# Patient Record
Sex: Female | Born: 1945 | Race: White | Hispanic: No | Marital: Married | Smoking: Never smoker
Health system: Southern US, Community
[De-identification: ages and names within clinical notes are randomized; demographics above are authoritative.]

## PROBLEM LIST (undated history)

## (undated) DIAGNOSIS — M2040 Other hammer toe(s) (acquired), unspecified foot: Secondary | ICD-10-CM

## (undated) DIAGNOSIS — E119 Type 2 diabetes mellitus without complications: Secondary | ICD-10-CM

## (undated) DIAGNOSIS — C541 Malignant neoplasm of endometrium: Secondary | ICD-10-CM

## (undated) DIAGNOSIS — D649 Anemia, unspecified: Secondary | ICD-10-CM

## (undated) DIAGNOSIS — E538 Deficiency of other specified B group vitamins: Secondary | ICD-10-CM

## (undated) DIAGNOSIS — I00 Rheumatic fever without heart involvement: Secondary | ICD-10-CM

## (undated) DIAGNOSIS — I10 Essential (primary) hypertension: Secondary | ICD-10-CM

## (undated) DIAGNOSIS — M21619 Bunion of unspecified foot: Secondary | ICD-10-CM

## (undated) DIAGNOSIS — R918 Other nonspecific abnormal finding of lung field: Secondary | ICD-10-CM

## (undated) DIAGNOSIS — E1140 Type 2 diabetes mellitus with diabetic neuropathy, unspecified: Secondary | ICD-10-CM

## (undated) DIAGNOSIS — F32A Depression, unspecified: Secondary | ICD-10-CM

## (undated) DIAGNOSIS — G43909 Migraine, unspecified, not intractable, without status migrainosus: Secondary | ICD-10-CM

## (undated) DIAGNOSIS — S42309A Unspecified fracture of shaft of humerus, unspecified arm, initial encounter for closed fracture: Secondary | ICD-10-CM

## (undated) DIAGNOSIS — F329 Major depressive disorder, single episode, unspecified: Secondary | ICD-10-CM

## (undated) DIAGNOSIS — C189 Malignant neoplasm of colon, unspecified: Secondary | ICD-10-CM

## (undated) DIAGNOSIS — E7800 Pure hypercholesterolemia, unspecified: Secondary | ICD-10-CM

## (undated) DIAGNOSIS — Z8701 Personal history of pneumonia (recurrent): Secondary | ICD-10-CM

## (undated) DIAGNOSIS — R0600 Dyspnea, unspecified: Secondary | ICD-10-CM

## (undated) HISTORY — PX: UPPER GI ENDOSCOPY: SHX6162

## (undated) HISTORY — DX: Type 2 diabetes mellitus without complications: E11.9

## (undated) HISTORY — DX: Anemia, unspecified: D64.9

## (undated) HISTORY — PX: COLONOSCOPY: SHX174

## (undated) HISTORY — DX: Malignant neoplasm of colon, unspecified: C18.9

## (undated) HISTORY — PX: PARTIAL COLECTOMY: SHX5273

## (undated) HISTORY — DX: Major depressive disorder, single episode, unspecified: F32.9

## (undated) HISTORY — DX: Pure hypercholesterolemia, unspecified: E78.00

## (undated) HISTORY — DX: Depression, unspecified: F32.A

## (undated) HISTORY — PX: TONSILLECTOMY: SUR1361

## (undated) HISTORY — DX: Essential (primary) hypertension: I10

## (undated) HISTORY — DX: Personal history of pneumonia (recurrent): Z87.01

---

## 1972-12-02 HISTORY — PX: TUBAL LIGATION: SHX77

## 2004-11-20 ENCOUNTER — Ambulatory Visit: Payer: Self-pay | Admitting: Oncology

## 2005-01-24 ENCOUNTER — Ambulatory Visit: Payer: Self-pay | Admitting: Oncology

## 2005-03-21 ENCOUNTER — Ambulatory Visit: Payer: Self-pay | Admitting: Oncology

## 2005-05-23 ENCOUNTER — Ambulatory Visit: Payer: Self-pay | Admitting: Oncology

## 2005-07-19 ENCOUNTER — Ambulatory Visit: Payer: Self-pay | Admitting: Oncology

## 2005-09-16 ENCOUNTER — Ambulatory Visit: Payer: Self-pay | Admitting: Oncology

## 2005-12-11 ENCOUNTER — Ambulatory Visit: Payer: Self-pay | Admitting: Oncology

## 2006-03-03 ENCOUNTER — Ambulatory Visit: Payer: Self-pay | Admitting: Oncology

## 2006-04-21 ENCOUNTER — Ambulatory Visit: Payer: Self-pay | Admitting: Oncology

## 2006-05-19 ENCOUNTER — Ambulatory Visit: Payer: Self-pay | Admitting: Oncology

## 2006-06-16 ENCOUNTER — Ambulatory Visit: Payer: Self-pay | Admitting: Oncology

## 2006-08-11 ENCOUNTER — Ambulatory Visit: Payer: Self-pay | Admitting: Oncology

## 2006-10-06 ENCOUNTER — Ambulatory Visit: Payer: Self-pay | Admitting: Oncology

## 2006-12-01 ENCOUNTER — Ambulatory Visit: Payer: Self-pay | Admitting: Oncology

## 2007-04-20 ENCOUNTER — Ambulatory Visit: Payer: Self-pay | Admitting: Oncology

## 2007-08-21 ENCOUNTER — Ambulatory Visit: Payer: Self-pay | Admitting: Oncology

## 2012-12-02 HISTORY — PX: AMPUTATION TOE: SHX6595

## 2016-01-29 DIAGNOSIS — E0842 Diabetes mellitus due to underlying condition with diabetic polyneuropathy: Secondary | ICD-10-CM | POA: Insufficient documentation

## 2016-01-29 DIAGNOSIS — L97512 Non-pressure chronic ulcer of other part of right foot with fat layer exposed: Secondary | ICD-10-CM | POA: Insufficient documentation

## 2016-02-26 DIAGNOSIS — S98131A Complete traumatic amputation of one right lesser toe, initial encounter: Secondary | ICD-10-CM | POA: Insufficient documentation

## 2017-05-21 DIAGNOSIS — E782 Mixed hyperlipidemia: Secondary | ICD-10-CM | POA: Insufficient documentation

## 2017-05-21 DIAGNOSIS — R072 Precordial pain: Secondary | ICD-10-CM | POA: Insufficient documentation

## 2017-05-21 DIAGNOSIS — H34211 Partial retinal artery occlusion, right eye: Secondary | ICD-10-CM | POA: Insufficient documentation

## 2017-05-21 DIAGNOSIS — E1142 Type 2 diabetes mellitus with diabetic polyneuropathy: Secondary | ICD-10-CM | POA: Insufficient documentation

## 2017-05-21 DIAGNOSIS — I10 Essential (primary) hypertension: Secondary | ICD-10-CM | POA: Insufficient documentation

## 2018-02-18 ENCOUNTER — Encounter: Payer: Self-pay | Admitting: Gastroenterology

## 2018-04-15 ENCOUNTER — Encounter: Payer: Self-pay | Admitting: Obstetrics

## 2018-06-10 ENCOUNTER — Telehealth: Payer: Self-pay

## 2018-06-10 DIAGNOSIS — C541 Malignant neoplasm of endometrium: Secondary | ICD-10-CM | POA: Insufficient documentation

## 2018-06-10 NOTE — Telephone Encounter (Signed)
LM for Cecille Rubin stating that pt  Has an appointment on 7-15 at 1015 with Dr. Gerarda Fraction.  She needs to register at 0930 and go over New patient information. Requested records including recent  pap smear information directly with Cecille Rubin in an earlier conversation.

## 2018-06-11 NOTE — H&P (View-Only) (Signed)
GYNECOLOGIC ONCOLOGY - [facility] at [facility]               Dr. Janie Morning, Dr. Marti Sleigh, Dr. Precious Haws, Dr. Everitt Amber [facility] Note: New Patient FIRST VISIT   Consult was requested by Dr. Ashby Dawes  Chief Complaint  Patient presents with  . Endometrial cancer [facility])    HPI: [name]  is a very nice 72 y.o.  P4  She noted postmenopausal bleeding described as spotting a few months prior to presentation. Noted some cramps, otherwise no significant symptoms. She called her PCP to report the bleeding and then was referred to Dr. Octaviano Glow for work-up. A Pap smear was done revealing AGUS with HRHPV negative. A cervical polyp was removed and found to be benign.  Followup TVUS showing thickened EM lining and bilateral adnexal masses. Endometrial biopsy  (read by [facility]) revealed grade 1 endometrial adenocarcinoma.   CA125 elevated for the lab at 30.7 (lab normal <21) CEA mildly elevated at 3.3 (lab normal <3 for nonsmokers)  Denies nausea, vomiting, change in appetite. Has lost about 7 pounds in 6 months. Some pelvic cramping.  Has history of colon cancer operated on in [facility]. States had normal colonoscopy 2018.    Imported EPIC Oncologic History:    No history exists.    Measurement of disease:  TBD .   Radiology: . 05/19/18 - TVUS -  8.7 x 5 x 4cm uterus , with 2 cm Em lining avascular. 2 complex masses in CDS 5.5x5x6cm and 6x6.3x6.4 cm vascular (ovaries versus other).  Current Meds:  Outpatient Encounter Medications as of 06/15/2018  Medication Sig  . Alpha-Lipoic Acid 200 MG CAPS Take by mouth 3 (three) times daily.  Marland Kitchen amLODipine (NORVASC) 10 MG tablet Take 10 mg by mouth daily.   . carvedilol (COREG) 6.25 MG tablet Take by mouth 2 (two) times daily.  . citalopram (CELEXA) 40 MG tablet Take 1 tablet by mouth daily.  Marland Kitchen glucose blood (ONE TOUCH ULTRA TEST) test strip   .  hydrochlorothiazide (HYDRODIURIL) 25 MG tablet Take 1 tablet by mouth daily.  Marland Kitchen lisinopril (PRINIVIL,ZESTRIL) 20 MG tablet Take 1 tablet by mouth daily.  . metFORMIN (GLUCOPHAGE) 500 MG tablet Take 1 tablet by mouth. Take three tablets in the am take two tablets in the evening.  . nitroGLYCERIN (NITROSTAT) 0.4 MG SL tablet Place under the tongue as needed for chest pain. Patient takes as needed for chest pain  . ONETOUCH DELICA LANCETS 87F MISC   . ONETOUCH VERIO test strip   . simvastatin (ZOCOR) 40 MG tablet Take 1 tablet by mouth at bedtime.   No facility-administered encounter medications on file as of 06/15/2018.     Allergy:  Allergies  Allergen Reactions  . Hydrocodone Other (See Comments)    Hallucinations    Past Surgical Hx:  Past Surgical History:  Procedure Laterality Date  . AMPUTATION TOE  2014   2 toes amputated  . PARTIAL COLECTOMY  2002 or 2003   Colon cancer [facility] - "took 18-22 inches"  . TONSILLECTOMY     Patient had her tonsils taken out at age 89  . TUBAL LIGATION  1974    Past Medical Hx:  Past Medical History:  Diagnosis Date  . Anemia   . Colon cancer ([place])    surgery followed by chemo for "6 months"  . Depression   . Diabetes mellitus without complication ([place])   .  High cholesterol   . History of pneumonia   . Hypertension     Past Gynecological History:   GYNECOLOGIC HISTORY:  No LMP recorded. Menarche: 72 years old P 4 LMP 20 years ago, then again with referral diagnosis Contraceptive never HRT 1 year  Last Pap  Referral Pap 05/2018 AGUS with negative HRHPV  Family Hx:  Family History  Problem Relation Age of Onset  . Heart attack Father   . Diabetes Brother   . Diabetes Maternal Grandmother   . Lung cancer Mother   . Breast cancer Mother        ? bony mets  . Breast cancer Maternal Aunt   . Colon cancer Maternal Uncle   . Breast cancer Maternal Aunt   . Cancer Cousin        unknown type in their 17's  . Cancer  Cousin        Lung in their 58's  . Colon cancer Maternal Uncle     Social Hx:  Tobacco use: never Alcohol use: never Illicit Drug use: denies Illicit IV Drug use: denies  Review of Systems:  Review of Systems  Genitourinary: Positive for vaginal bleeding.   All other systems reviewed and are negative.    Vitals:  Vitals:   06/15/18 1000  BP: (!) 123/56  Pulse: 95  Resp: 18  Temp: 98.6 F (37 C)  SpO2: 96%   Body mass index is 35.35 kg/m.   Physical Exam: ECOG PERFORMANCE STATUS: 1 - Symptomatic but completely ambulatory   General :  Well developed, 72 y.o., female in no apparent distress HEENT:  Normocephalic/atraumatic, symmetric, EOMI, eyelids normal Neck:   Supple, no masses.  Lymphatics:  No cervical/ submandibular/ supraclavicular/ infraclavicular/ inguinal adenopathy Respiratory:  Respirations unlabored, no use of accessory muscles CV:   Deferred Breast:  Deferred Musculoskeletal: No CVA tenderness, normal muscle strength. Abdomen:  Soft, non-tender and nondistended. No evidence of hernia. No masses. Extremities:  No lymphedema, no erythema, non-tender. Skin:   Normal inspection Neuro/Psych:  No focal motor deficit, no abnormal mental status. Normal gait. Normal affect. Alert and oriented to person, place, and time  Genito Urinary: Vulva: Normal external female genitalia.  Bladder/urethra: Urethral meatus normal in size and location. No lesions or   masses, well supported bladder Speculum exam: Vagina: No lesion, no discharge, no bleeding. Cervix: Normal appearing, no lesions. Bimanual exam:  Uterus: Normal size, mobile.  Adnexa: + palpable fullness / mass in culdesac  Rectovaginal:  Good tone, confirms above. No cul de sac nodularity, no parametrial involvement or nodularity.   Assessment Endometrial cancer ? Adnexal (bilateral) versus other  Plan: 1. Counseling for uterine cancer o We discussed the diagnosis of uterine cancer the typical  presentation and prognosis o Standard of care hysterectomy versus alternatives of hormones and radiation were reviewed o We discussed the possibility of additional treatments following surgery, such as radiation and/or chemotherapy 2. Surgical management o Patient would like to proceed with standard of care management i.e. Hysterectomy o Reviewed the options for modalities of hysterectomy o Surgical sketch was reviewed including the risks, benefits, and alternatives.  She was given a copy of this today and one will be placed in the chart. o Plan will be for robotic assisted laparoscopic hysterectomy, BSO, sentinel lymph node biopsy, washings, and possible full staging i. She understands if I diagnose ovarian cancer my recommendation would be for Exlap/staging. ii. Also, given the colon resection, understands the risk of adhesive disease and conversion to  Exlap 3. Preoperative items will include  o Obtaining CT imaging given the ? Adnexal masses and slightly elevated CA125 o HgbA1c 4. The husband and son were present for the discussion with no further questions after review of the above 5. I will plan to have her return in 10 to 14 days postop to review the pathology and discuss any further management 6. Genetics to be kept in planning given personal and family h/o colon cancer  [name], MD  06/15/2018, 4:54 PM    Cc: Ashby Dawes, DO (Referring Ob/Gyn) Cecille Amsterdam, DO (PCP)

## 2018-06-11 NOTE — Progress Notes (Signed)
GYNECOLOGIC ONCOLOGY - [facility]               Dr. Janie Morning, Dr. Marti Sleigh, Dr. Precious Haws, Dr. Everitt Amber [facility] [place] Note: New Patient FIRST VISIT   Consult was requested by Dr. Ashby Dawes  Chief Complaint  Patient presents with  . Endometrial cancer [facility])    HPI: [name]  is a very nice 72 y.o.  P4  She noted postmenopausal bleeding described as spotting a few months prior to presentation. Noted some cramps, otherwise no significant symptoms. She called her PCP to report the bleeding and then was referred to Dr. Octaviano Glow for work-up. A Pap smear was done revealing AGUS with HRHPV negative. A cervical polyp was removed and found to be benign.  Followup TVUS showing thickened EM lining and bilateral adnexal masses. Endometrial biopsy  (read by [facility]) revealed grade 1 endometrial adenocarcinoma.   CA125 elevated for the lab at 30.7 (lab normal <21) CEA mildly elevated at 3.3 (lab normal <3 for nonsmokers)  Denies nausea, vomiting, change in appetite. Has lost about 7 pounds in 6 months. Some pelvic cramping.  Has history of colon cancer operated on in [facility]. States had normal colonoscopy 2018.    Imported EPIC Oncologic History:    No history exists.    Measurement of disease:  TBD .   Radiology: . 05/19/18 - TVUS -  8.7 x 5 x 4cm uterus , with 2 cm Em lining avascular. 2 complex masses in CDS 5.5x5x6cm and 6x6.3x6.4 cm vascular (ovaries versus other).  Current Meds:  Outpatient Encounter Medications as of 06/15/2018  Medication Sig  . Alpha-Lipoic Acid 200 MG CAPS Take by mouth 3 (three) times daily.  Marland Kitchen amLODipine (NORVASC) 10 MG tablet Take 10 mg by mouth daily.   . carvedilol (COREG) 6.25 MG tablet Take by mouth 2 (two) times daily.  . citalopram (CELEXA) 40 MG tablet Take 1 tablet by mouth daily.  Marland Kitchen glucose blood (ONE TOUCH ULTRA TEST) test strip   .  hydrochlorothiazide (HYDRODIURIL) 25 MG tablet Take 1 tablet by mouth daily.  Marland Kitchen lisinopril (PRINIVIL,ZESTRIL) 20 MG tablet Take 1 tablet by mouth daily.  . metFORMIN (GLUCOPHAGE) 500 MG tablet Take 1 tablet by mouth. Take three tablets in the am take two tablets in the evening.  . nitroGLYCERIN (NITROSTAT) 0.4 MG SL tablet Place under the tongue as needed for chest pain. Patient takes as needed for chest pain  . ONETOUCH DELICA LANCETS 51V MISC   . ONETOUCH VERIO test strip   . simvastatin (ZOCOR) 40 MG tablet Take 1 tablet by mouth at bedtime.   No facility-administered encounter medications on file as of 06/15/2018.     Allergy:  Allergies  Allergen Reactions  . Hydrocodone Other (See Comments)    Hallucinations    Past Surgical Hx:  Past Surgical History:  Procedure Laterality Date  . AMPUTATION TOE  2014   2 toes amputated  . PARTIAL COLECTOMY  2002 or 2003   Colon cancer [facility] - "took 18-22 inches"  . TONSILLECTOMY     Patient had her tonsils taken out at age 42  . TUBAL LIGATION  1974    Past Medical Hx:  Past Medical History:  Diagnosis Date  . Anemia   . Colon cancer ([place])    surgery followed by chemo for "6 months"  . Depression   . Diabetes mellitus without complication ([place])   .  High cholesterol   . History of pneumonia   . Hypertension     Past Gynecological History:   GYNECOLOGIC HISTORY:  No LMP recorded. Menarche: 72 years old P 4 LMP 20 years ago, then again with referral diagnosis Contraceptive never HRT 1 year  Last Pap  Referral Pap 05/2018 AGUS with negative HRHPV  Family Hx:  Family History  Problem Relation Age of Onset  . Heart attack Father   . Diabetes Brother   . Diabetes Maternal Grandmother   . Lung cancer Mother   . Breast cancer Mother        ? bony mets  . Breast cancer Maternal Aunt   . Colon cancer Maternal Uncle   . Breast cancer Maternal Aunt   . Cancer Cousin        unknown type in their 79's  . Cancer  Cousin        Lung in their 3's  . Colon cancer Maternal Uncle     Social Hx:  Tobacco use: never Alcohol use: never Illicit Drug use: denies Illicit IV Drug use: denies  Review of Systems:  Review of Systems  Genitourinary: Positive for vaginal bleeding.   All other systems reviewed and are negative.    Vitals:  Vitals:   06/15/18 1000  BP: (!) 123/56  Pulse: 95  Resp: 18  Temp: 98.6 F (37 C)  SpO2: 96%   Body mass index is 35.35 kg/m.   Physical Exam: ECOG PERFORMANCE STATUS: 1 - Symptomatic but completely ambulatory   General :  Well developed, 72 y.o., female in no apparent distress HEENT:  Normocephalic/atraumatic, symmetric, EOMI, eyelids normal Neck:   Supple, no masses.  Lymphatics:  No cervical/ submandibular/ supraclavicular/ infraclavicular/ inguinal adenopathy Respiratory:  Respirations unlabored, no use of accessory muscles CV:   Deferred Breast:  Deferred Musculoskeletal: No CVA tenderness, normal muscle strength. Abdomen:  Soft, non-tender and nondistended. No evidence of hernia. No masses. Extremities:  No lymphedema, no erythema, non-tender. Skin:   Normal inspection Neuro/Psych:  No focal motor deficit, no abnormal mental status. Normal gait. Normal affect. Alert and oriented to person, place, and time  Genito Urinary: Vulva: Normal external female genitalia.  Bladder/urethra: Urethral meatus normal in size and location. No lesions or   masses, well supported bladder Speculum exam: Vagina: No lesion, no discharge, no bleeding. Cervix: Normal appearing, no lesions. Bimanual exam:  Uterus: Normal size, mobile.  Adnexa: + palpable fullness / mass in culdesac  Rectovaginal:  Good tone, confirms above. No cul de sac nodularity, no parametrial involvement or nodularity.   Assessment Endometrial cancer ? Adnexal (bilateral) versus other  Plan: 1. Counseling for uterine cancer o We discussed the diagnosis of uterine cancer the typical  presentation and prognosis o Standard of care hysterectomy versus alternatives of hormones and radiation were reviewed o We discussed the possibility of additional treatments following surgery, such as radiation and/or chemotherapy 2. Surgical management o Patient would like to proceed with standard of care management i.e. Hysterectomy o Reviewed the options for modalities of hysterectomy o Surgical sketch was reviewed including the risks, benefits, and alternatives.  She was given a copy of this today and one will be placed in the chart. o Plan will be for robotic assisted laparoscopic hysterectomy, BSO, sentinel lymph node biopsy, washings, and possible full staging i. She understands if I diagnose ovarian cancer my recommendation would be for Exlap/staging. ii. Also, given the colon resection, understands the risk of adhesive disease and conversion to  Exlap 3. Preoperative items will include  o Obtaining CT imaging given the ? Adnexal masses and slightly elevated CA125 o HgbA1c 4. The husband and son were present for the discussion with no further questions after review of the above 5. I will plan to have her return in 10 to 14 days postop to review the pathology and discuss any further management 6. Genetics to be kept in planning given personal and family h/o colon cancer  [name], MD  06/15/2018, 4:54 PM    Cc: Ashby Dawes, DO (Referring Ob/Gyn) Cecille Amsterdam, DO (PCP)

## 2018-06-15 ENCOUNTER — Inpatient Hospital Stay: Payer: Medicare HMO

## 2018-06-15 ENCOUNTER — Inpatient Hospital Stay: Payer: Medicare HMO | Attending: Obstetrics | Admitting: Obstetrics

## 2018-06-15 ENCOUNTER — Encounter: Payer: Self-pay | Admitting: Obstetrics

## 2018-06-15 VITALS — BP 123/56 | HR 95 | Temp 98.6°F | Resp 18 | Ht 65.5 in | Wt 215.7 lb

## 2018-06-15 DIAGNOSIS — E119 Type 2 diabetes mellitus without complications: Secondary | ICD-10-CM | POA: Diagnosis not present

## 2018-06-15 DIAGNOSIS — D4959 Neoplasm of unspecified behavior of other genitourinary organ: Secondary | ICD-10-CM

## 2018-06-15 DIAGNOSIS — C541 Malignant neoplasm of endometrium: Secondary | ICD-10-CM | POA: Insufficient documentation

## 2018-06-15 DIAGNOSIS — R971 Elevated cancer antigen 125 [CA 125]: Secondary | ICD-10-CM

## 2018-06-15 DIAGNOSIS — Z85038 Personal history of other malignant neoplasm of large intestine: Secondary | ICD-10-CM | POA: Insufficient documentation

## 2018-06-15 DIAGNOSIS — N949 Unspecified condition associated with female genital organs and menstrual cycle: Secondary | ICD-10-CM

## 2018-06-15 LAB — HEMOGLOBIN A1C
Hgb A1c MFr Bld: 7 % — ABNORMAL HIGH (ref 4.8–5.6)
MEAN PLASMA GLUCOSE: 154.2 mg/dL

## 2018-06-15 LAB — BUN & CREATININE (CHCC)
BUN: 29 mg/dL — ABNORMAL HIGH (ref 8–23)
CREATININE: 1.24 mg/dL — AB (ref 0.44–1.00)
GFR, Est AFR Am: 49 mL/min — ABNORMAL LOW (ref >60)
GFR, Estimated: 42 mL/min — ABNORMAL LOW (ref >60)

## 2018-06-15 NOTE — Patient Instructions (Addendum)
Plan on having a Bmet and HbgA1C drawn today.  Your CT scan of the chest, abdomen, and pelvis will be this Friday, June 19, 2018 at [facility] at 3:30pm.  Arrive at 3pm to check in.  See the instructions sheet about drinking the two bottles of contrast (one at 1:30pm and the other at 2:30pm the day of your scan).  Nothing to eat or drink 4 hours before your scan as well.                 Preparing for your Surgery  Plan for surgery on July 01, 2018 with Dr. Precious Haws at [place] will be scheduled for a robotic assisted total laparoscopic hysterectomy, bilateral salpingo-oophorectomy, sentinel lymph node biopsy, possible laparotomy, possible staging.  Pre-operative Testing -You will receive a phone call from presurgical testing at [facility] to arrange for a pre-operative testing appointment before your surgery.  This appointment normally occurs one to two weeks before your scheduled surgery.   -Bring your insurance card, copy of an advanced directive if applicable, medication list  -At that visit, you will be asked to sign a consent for a possible blood transfusion in case a transfusion becomes necessary during surgery.  The need for a blood transfusion is rare but having consent is a necessary part of your care.     -You should not be taking blood thinners or aspirin at least ten days prior to surgery unless instructed by your surgeon.  Day Before Surgery at [place] will be asked to take in a light diet the day before surgery.  Avoid carbonated beverages.  You will be advised to have nothing to eat or drink after midnight the evening before.    Eat a light diet the day before surgery.  Examples including soups, broths, toast, yogurt, mashed potatoes.  Things to avoid include carbonated beverages (fizzy beverages), raw fruits and raw vegetables, or beans.   If your bowels are filled with gas, your surgeon will have difficulty visualizing your pelvic  organs which increases your surgical risks.  Your role in recovery Your role is to become active as soon as directed by your doctor, while still giving yourself time to heal.  Rest when you feel tired. You will be asked to do the following in order to speed your recovery:  - Cough and breathe deeply. This helps toclear and expand your lungs and can prevent pneumonia. You may be given a spirometer to practice deep breathing. A staff member will show you how to use the spirometer. - Do mild physical activity. Walking or moving your legs help your circulation and body functions return to normal. A staff member will help you when you try to walk and will provide you with simple exercises. Do not try to get up or walk alone the first time. - Actively manage your pain. Managing your pain lets you move in comfort. We will ask you to rate your pain on a scale of zero to 10. It is your responsibility to tell your doctor or nurse where and how much you hurt so your pain can be treated.  Special Considerations -If you are diabetic, you may be placed on insulin after surgery to have closer control over your blood sugars to promote healing and recovery.  This does not mean that you will be discharged on insulin.  If applicable, your oral antidiabetics will be resumed when you are tolerating a solid diet.  -Your final  pathology results from surgery should be available by the Friday after surgery and the results will be relayed to you when available.  -Dr. Everitt Amber is the Surgeon that assists your GYN Oncologist with surgery.  The next day after your surgery you will either see your GYN Oncologist, Dr. Everitt Amber, or Dr. Lahoma Crocker.   Blood Transfusion Information WHAT IS A BLOOD TRANSFUSION? A transfusion is the replacement of blood or some of its parts. Blood is made up of multiple cells which provide different functions.  Red blood cells carry oxygen and are used for blood loss  replacement.  White blood cells fight against infection.  Platelets control bleeding.  Plasma helps clot blood.  Other blood products are available for specialized needs, such as hemophilia or other clotting disorders. BEFORE THE TRANSFUSION  Who gives blood for transfusions?   You may be able to donate blood to be used at a later date on yourself (autologous donation).  Relatives can be asked to donate blood. This is generally not any safer than if you have received blood from a stranger. The same precautions are taken to ensure safety when a relative's blood is donated.  Healthy volunteers who are fully evaluated to make sure their blood is safe. This is blood bank blood. Transfusion therapy is the safest it has ever been in the practice of medicine. Before blood is taken from a donor, a complete history is taken to make sure that person has no history of diseases nor engages in risky social behavior (examples are intravenous drug use or sexual activity with multiple partners). The donor's travel history is screened to minimize risk of transmitting infections, such as malaria. The donated blood is tested for signs of infectious diseases, such as HIV and hepatitis. The blood is then tested to be sure it is compatible with you in order to minimize the chance of a transfusion reaction. If you or a relative donates blood, this is often done in anticipation of surgery and is not appropriate for emergency situations. It takes many days to process the donated blood. RISKS AND COMPLICATIONS Although transfusion therapy is very safe and saves many lives, the main dangers of transfusion include:   Getting an infectious disease.  Developing a transfusion reaction. This is an allergic reaction to something in the blood you were given. Every precaution is taken to prevent this. The decision to have a blood transfusion has been considered carefully by your caregiver before blood is given. Blood is not  given unless the benefits outweigh the risks.

## 2018-06-16 ENCOUNTER — Telehealth: Payer: Self-pay | Admitting: *Deleted

## 2018-06-16 NOTE — Telephone Encounter (Signed)
Faxed ROI to [employer]; release [phone]

## 2018-06-18 ENCOUNTER — Telehealth: Payer: Self-pay

## 2018-06-18 NOTE — Telephone Encounter (Signed)
Faxed recent Hgb A1c to PCP. Received lab work from PCP from 04-15-18 which included Hgb A1c, c-met, and lipid panel.

## 2018-06-19 ENCOUNTER — Ambulatory Visit (HOSPITAL_COMMUNITY)
Admission: RE | Admit: 2018-06-19 | Discharge: 2018-06-19 | Disposition: A | Discharge status: Home / Self Care | Payer: Medicare HMO | Source: Ambulatory Visit | Attending: Obstetrics | Admitting: Obstetrics

## 2018-06-19 ENCOUNTER — Encounter (HOSPITAL_COMMUNITY): Payer: Self-pay | Admitting: Radiology

## 2018-06-19 DIAGNOSIS — N6320 Unspecified lump in the left breast, unspecified quadrant: Secondary | ICD-10-CM | POA: Insufficient documentation

## 2018-06-19 DIAGNOSIS — N949 Unspecified condition associated with female genital organs and menstrual cycle: Secondary | ICD-10-CM

## 2018-06-19 DIAGNOSIS — Z85038 Personal history of other malignant neoplasm of large intestine: Secondary | ICD-10-CM | POA: Diagnosis not present

## 2018-06-19 DIAGNOSIS — D4959 Neoplasm of unspecified behavior of other genitourinary organ: Secondary | ICD-10-CM

## 2018-06-19 DIAGNOSIS — R918 Other nonspecific abnormal finding of lung field: Secondary | ICD-10-CM | POA: Insufficient documentation

## 2018-06-19 DIAGNOSIS — C541 Malignant neoplasm of endometrium: Secondary | ICD-10-CM | POA: Diagnosis present

## 2018-06-19 DIAGNOSIS — K8020 Calculus of gallbladder without cholecystitis without obstruction: Secondary | ICD-10-CM | POA: Insufficient documentation

## 2018-06-19 DIAGNOSIS — R9389 Abnormal findings on diagnostic imaging of other specified body structures: Secondary | ICD-10-CM | POA: Diagnosis not present

## 2018-06-19 DIAGNOSIS — Z9049 Acquired absence of other specified parts of digestive tract: Secondary | ICD-10-CM | POA: Diagnosis not present

## 2018-06-19 MED ORDER — IOPAMIDOL (ISOVUE-300) INJECTION 61%
INTRAVENOUS | Status: AC
  Filled 2018-06-19: qty 100

## 2018-06-19 MED ORDER — IOPAMIDOL (ISOVUE-300) INJECTION 61%
100.0000 mL | Freq: Once | INTRAVENOUS | Status: AC | PRN
  Administered 2018-06-19: 80 mL via INTRAVENOUS

## 2018-06-23 ENCOUNTER — Encounter (HOSPITAL_COMMUNITY): Payer: Self-pay

## 2018-06-23 ENCOUNTER — Telehealth: Payer: Self-pay | Admitting: Oncology

## 2018-06-23 NOTE — Patient Instructions (Signed)
Your procedure is scheduled on: Wednesday, July 01, 2018   Surgery Time:  7:30AM-11:00AM   Report to [facility] Main  Entrance    Report to admitting at 5:30 AM   Call this number if you have problems the morning of surgery [phone]   Eat a light diet the day before surgery. Examples including soups, broths, toast, yogurt, mashed potatoes. Things to avoid include carbonated beverages (fizzy beverages), raw fruits and raw vegetables, or beans.    Do not eat food or drink liquids :After Midnight.   Do NOT smoke after Midnight   Take these medicines the morning of surgery with A SIP OF WATER: Allopurinol, Amlodipine, Carvedilol                               You may not have any metal on your body including hair pins, jewelry, and body piercings             Do not wear make-up, lotions, powders, perfumes/cologne, or deodorant             Do not wear nail polish.  Do not shave  48 hours prior to surgery.                Do not bring valuables to the hospital. [place].   Contacts, dentures or bridgework may not be worn into surgery.   Leave suitcase in the car. After surgery it may be brought to your room.   Special Instructions: Bring a copy of your healthcare power of attorney and living will documents         the day of surgery if you haven't scanned them in before.              Please read over the following fact sheets you were given:  [facility] - Preparing for Surgery Before surgery, you can play an important role.  Because skin is not sterile, your skin needs to be as free of germs as possible.  You can reduce the number of germs on your skin by washing with CHG (chlorahexidine gluconate) soap before surgery.  CHG is an antiseptic cleaner which kills germs and bonds with the skin to continue killing germs even after washing. Please DO NOT use if you have an allergy to CHG or antibacterial soaps.  If your  skin becomes reddened/irritated stop using the CHG and inform your nurse when you arrive at Short Stay. Do not shave (including legs and underarms) for at least 48 hours prior to the first CHG shower.  You may shave your face/neck.  Please follow these instructions carefully:  1.  Shower with CHG Soap the night before surgery and the  morning of surgery.  2.  If you choose to wash your hair, wash your hair first as usual with your normal  shampoo.  3.  After you shampoo, rinse your hair and body thoroughly to remove the shampoo.                             4.  Use CHG as you would any other liquid soap.  You can apply chg directly to the skin and wash.  Gently with a scrungie or clean washcloth.  5.  Apply the CHG Soap  to your body ONLY FROM THE NECK DOWN.   Do   not use on face/ open                           Wound or open sores. Avoid contact with eyes, ears mouth and   genitals (private parts).                       Wash face,  Genitals (private parts) with your normal soap.             6.  Wash thoroughly, paying special attention to the area where your    surgery  will be performed.  7.  Thoroughly rinse your body with warm water from the neck down.  8.  DO NOT shower/wash with your normal soap after using and rinsing off the CHG Soap.                9.  Pat yourself dry with a clean towel.            10.  Wear clean pajamas.            11.  Place clean sheets on your bed the night of your first shower and do not  sleep with pets. Day of Surgery : Do not apply any lotions/deodorants the morning of surgery.  Please wear clean clothes to the hospital/surgery center.  FAILURE TO FOLLOW THESE INSTRUCTIONS MAY RESULT IN THE CANCELLATION OF YOUR SURGERY  PATIENT SIGNATURE_________________________________  NURSE SIGNATURE__________________________________  ________________________________________________________________________   Adam Phenix  An incentive spirometer is a tool  that can help keep your lungs clear and active. This tool measures how well you are filling your lungs with each breath. Taking long deep breaths may help reverse or decrease the chance of developing breathing (pulmonary) problems (especially infection) following:  A long period of time when you are unable to move or be active. BEFORE THE PROCEDURE   If the spirometer includes an indicator to show your best effort, your nurse or respiratory therapist will set it to a desired goal.  If possible, sit up straight or lean slightly forward. Try not to slouch.  Hold the incentive spirometer in an upright position. INSTRUCTIONS FOR USE  1. Sit on the edge of your bed if possible, or sit up as far as you can in bed or on a chair. 2. Hold the incentive spirometer in an upright position. 3. Breathe out normally. 4. Place the mouthpiece in your mouth and seal your lips tightly around it. 5. Breathe in slowly and as deeply as possible, raising the piston or the ball toward the top of the column. 6. Hold your breath for 3-5 seconds or for as long as possible. Allow the piston or ball to fall to the bottom of the column. 7. Remove the mouthpiece from your mouth and breathe out normally. 8. Rest for a few seconds and repeat Steps 1 through 7 at least 10 times every 1-2 hours when you are awake. Take your time and take a few normal breaths between deep breaths. 9. The spirometer may include an indicator to show your best effort. Use the indicator as a goal to work toward during each repetition. 10. After each set of 10 deep breaths, practice coughing to be sure your lungs are clear. If you have an incision (the cut made at the time of surgery), support your incision when coughing by placing  a pillow or rolled up towels firmly against it. Once you are able to get out of bed, walk around indoors and cough well. You may stop using the incentive spirometer when instructed by your caregiver.  RISKS AND  COMPLICATIONS  Take your time so you do not get dizzy or light-headed.  If you are in pain, you may need to take or ask for pain medication before doing incentive spirometry. It is harder to take a deep breath if you are having pain. AFTER USE  Rest and breathe slowly and easily.  It can be helpful to keep track of a log of your progress. Your caregiver can provide you with a simple table to help with this. If you are using the spirometer at home, follow these instructions: [place] IF:   You are having difficultly using the spirometer.  You have trouble using the spirometer as often as instructed.  Your pain medication is not giving enough relief while using the spirometer.  You develop fever of 100.5 F (38.1 C) or higher. SEEK IMMEDIATE MEDICAL CARE IF:   You cough up bloody sputum that had not been present before.  You develop fever of 102 F (38.9 C) or greater.  You develop worsening pain at or near the incision site. MAKE SURE YOU:   Understand these instructions.  Will watch your condition.  Will get help right away if you are not doing well or get worse. Document Released: 03/31/2007 Document Revised: 02/10/2012 Document Reviewed: 06/01/2007 ExitCare Patient Information 2014 ExitCare, [place].   ________________________________________________________________________  WHAT IS A BLOOD TRANSFUSION? Blood Transfusion Information  A transfusion is the replacement of blood or some of its parts. Blood is made up of multiple cells which provide different functions.  Red blood cells carry oxygen and are used for blood loss replacement.  White blood cells fight against infection.  Platelets control bleeding.  Plasma helps clot blood.  Other blood products are available for specialized needs, such as hemophilia or other clotting disorders. BEFORE THE TRANSFUSION  Who gives blood for transfusions?   Healthy volunteers who are fully evaluated to make sure  their blood is safe. This is blood bank blood. Transfusion therapy is the safest it has ever been in the practice of medicine. Before blood is taken from a donor, a complete history is taken to make sure that person has no history of diseases nor engages in risky social behavior (examples are intravenous drug use or sexual activity with multiple partners). The donor's travel history is screened to minimize risk of transmitting infections, such as malaria. The donated blood is tested for signs of infectious diseases, such as HIV and hepatitis. The blood is then tested to be sure it is compatible with you in order to minimize the chance of a transfusion reaction. If you or a relative donates blood, this is often done in anticipation of surgery and is not appropriate for emergency situations. It takes many days to process the donated blood. RISKS AND COMPLICATIONS Although transfusion therapy is very safe and saves many lives, the main dangers of transfusion include:   Getting an infectious disease.  Developing a transfusion reaction. This is an allergic reaction to something in the blood you were given. Every precaution is taken to prevent this. The decision to have a blood transfusion has been considered carefully by your caregiver before blood is given. Blood is not given unless the benefits outweigh the risks. AFTER THE TRANSFUSION  Right after receiving a blood transfusion,  you will usually feel much better and more energetic. This is especially true if your red blood cells have gotten low (anemic). The transfusion raises the level of the red blood cells which carry oxygen, and this usually causes an energy increase.  The nurse administering the transfusion will monitor you carefully for complications. HOME CARE INSTRUCTIONS  No special instructions are needed after a transfusion. You may find your energy is better. Speak with your caregiver about any limitations on activity for underlying diseases  you may have. SEEK MEDICAL CARE IF:   Your condition is not improving after your transfusion.  You develop redness or irritation at the intravenous (IV) site. SEEK IMMEDIATE MEDICAL CARE IF:  Any of the following symptoms occur over the next 12 hours:  Shaking chills.  You have a temperature by mouth above 102 F (38.9 C), not controlled by medicine.  Chest, back, or muscle pain.  People around you feel you are not acting correctly or are confused.  Shortness of breath or difficulty breathing.  Dizziness and fainting.  You get a rash or develop hives.  You have a decrease in urine output.  Your urine turns a dark color or changes to pink, red, or brown. Any of the following symptoms occur over the next 10 days:  You have a temperature by mouth above 102 F (38.9 C), not controlled by medicine.  Shortness of breath.  Weakness after normal activity.  The white part of the eye turns yellow (jaundice).  You have a decrease in the amount of urine or are urinating less often.  Your urine turns a dark color or changes to pink, red, or brown. Document Released: 11/15/2000 Document Revised: 02/10/2012 Document Reviewed: 07/04/2008 [facility] Patient Information 2014 [place], [place].  _______________________________________________________________________

## 2018-06-23 NOTE — Pre-Procedure Instructions (Signed)
Hgb A1C (7.0) 06/15/18 in epic.

## 2018-06-23 NOTE — Telephone Encounter (Addendum)
Called patient regarding her last mammogram.  She said she thinks it was last year at [place] [facility] and they said the last mammogram patient had was 10/16/16.  They will push the images so that they can be viewed in [place].

## 2018-06-23 NOTE — Telephone Encounter (Signed)
Requested copy of the mammogram report from [facility] with [facility] records.

## 2018-06-24 ENCOUNTER — Telehealth: Payer: Self-pay | Admitting: Gynecologic Oncology

## 2018-06-24 ENCOUNTER — Encounter: Payer: Self-pay | Admitting: Oncology

## 2018-06-24 ENCOUNTER — Telehealth: Payer: Self-pay | Admitting: Oncology

## 2018-06-24 DIAGNOSIS — N6320 Unspecified lump in the left breast, unspecified quadrant: Secondary | ICD-10-CM

## 2018-06-24 NOTE — Telephone Encounter (Signed)
Called patient and notified her of mammogram/left ultrasound appointment at [facility] on 07/08/18 at 9:30 am.  Patient verbalized understanding and agreement.

## 2018-06-24 NOTE — Telephone Encounter (Signed)
Discussed CT scan findings with the patient along with the need to have an up to date mammogram since an enhancing area was noted on CT chest.  Patient would like to have at [facility].  Advised she would be contacted with additional information.  Pre-op is tomorrow.  Advised to call for any needs or concerns.

## 2018-06-25 ENCOUNTER — Encounter (HOSPITAL_COMMUNITY): Payer: Self-pay

## 2018-06-25 ENCOUNTER — Other Ambulatory Visit: Payer: Self-pay

## 2018-06-25 ENCOUNTER — Encounter (HOSPITAL_COMMUNITY)
Admission: RE | Admit: 2018-06-25 | Discharge: 2018-06-25 | Disposition: A | Discharge status: Home / Self Care | Payer: Medicare HMO | Source: Ambulatory Visit | Attending: Obstetrics | Admitting: Obstetrics

## 2018-06-25 DIAGNOSIS — E1140 Type 2 diabetes mellitus with diabetic neuropathy, unspecified: Secondary | ICD-10-CM | POA: Diagnosis not present

## 2018-06-25 DIAGNOSIS — Z01812 Encounter for preprocedural laboratory examination: Secondary | ICD-10-CM | POA: Insufficient documentation

## 2018-06-25 DIAGNOSIS — Z01810 Encounter for preprocedural cardiovascular examination: Secondary | ICD-10-CM | POA: Insufficient documentation

## 2018-06-25 DIAGNOSIS — I452 Bifascicular block: Secondary | ICD-10-CM | POA: Diagnosis not present

## 2018-06-25 HISTORY — DX: Migraine, unspecified, not intractable, without status migrainosus: G43.909

## 2018-06-25 HISTORY — DX: Type 2 diabetes mellitus with diabetic neuropathy, unspecified: E11.40

## 2018-06-25 HISTORY — DX: Dyspnea, unspecified: R06.00

## 2018-06-25 HISTORY — DX: Rheumatic fever without heart involvement: I00

## 2018-06-25 HISTORY — DX: Bunion of unspecified foot: M21.619

## 2018-06-25 HISTORY — DX: Other nonspecific abnormal finding of lung field: R91.8

## 2018-06-25 HISTORY — DX: Deficiency of other specified B group vitamins: E53.8

## 2018-06-25 HISTORY — DX: Other hammer toe(s) (acquired), unspecified foot: M20.40

## 2018-06-25 HISTORY — DX: Malignant neoplasm of endometrium: C54.1

## 2018-06-25 HISTORY — DX: Unspecified fracture of shaft of humerus, unspecified arm, initial encounter for closed fracture: S42.309A

## 2018-06-25 LAB — COMPREHENSIVE METABOLIC PANEL
ALK PHOS: 64 U/L (ref 38–126)
ALT: 14 U/L (ref 0–44)
ANION GAP: 11 (ref 5–15)
AST: 17 U/L (ref 15–41)
Albumin: 3.6 g/dL (ref 3.5–5.0)
BUN: 22 mg/dL (ref 8–23)
CO2: 26 mmol/L (ref 22–32)
Calcium: 9.6 mg/dL (ref 8.9–10.3)
Chloride: 104 mmol/L (ref 98–111)
Creatinine, Ser: 1.26 mg/dL — ABNORMAL HIGH (ref 0.44–1.00)
GFR, EST AFRICAN AMERICAN: 48 mL/min — AB (ref >60)
GFR, EST NON AFRICAN AMERICAN: 42 mL/min — AB (ref >60)
Glucose, Bld: 111 mg/dL — ABNORMAL HIGH (ref 70–99)
Potassium: 5.1 mmol/L (ref 3.5–5.1)
SODIUM: 141 mmol/L (ref 135–145)
Total Bilirubin: 0.6 mg/dL (ref 0.3–1.2)
Total Protein: 7.4 g/dL (ref 6.5–8.1)

## 2018-06-25 LAB — CBC
HCT: 34.3 % — ABNORMAL LOW (ref 36.0–46.0)
HEMOGLOBIN: 10.1 g/dL — AB (ref 12.0–15.0)
MCH: 22.4 pg — AB (ref 26.0–34.0)
MCHC: 29.4 g/dL — AB (ref 30.0–36.0)
MCV: 76.1 fL — ABNORMAL LOW (ref 78.0–100.0)
PLATELETS: 433 10*3/uL — AB (ref 150–400)
RBC: 4.51 MIL/uL (ref 3.87–5.11)
RDW: 15.8 % — ABNORMAL HIGH (ref 11.5–15.5)
WBC: 8 10*3/uL (ref 4.0–10.5)

## 2018-06-25 LAB — URINALYSIS, ROUTINE W REFLEX MICROSCOPIC
BILIRUBIN URINE: NEGATIVE
Glucose, UA: NEGATIVE mg/dL
KETONES UR: NEGATIVE mg/dL
Nitrite: NEGATIVE
PROTEIN: 30 mg/dL — AB
Specific Gravity, Urine: 1.017 (ref 1.005–1.030)
WBC, UA: >50 WBC/hpf — ABNORMAL HIGH (ref 0–5)
pH: 5 (ref 5.0–8.0)

## 2018-06-25 LAB — GLUCOSE, CAPILLARY: GLUCOSE-CAPILLARY: 137 mg/dL — AB (ref 70–99)

## 2018-06-25 LAB — ABO/RH: ABO/RH(D): O POS

## 2018-06-25 NOTE — Pre-Procedure Instructions (Signed)
CBC, CMP, UA results 06/25/18 faxed to Dr. Gerarda Fraction and Jerilynn Mages. Cross, NP via epic.

## 2018-06-26 NOTE — Pre-Procedure Instructions (Signed)
EKG reviewed by Dr. Linna Caprice no new orders received at this time.

## 2018-06-28 LAB — URINE CULTURE: Culture: 80000 — AB

## 2018-06-29 ENCOUNTER — Telehealth: Payer: Self-pay

## 2018-06-29 DIAGNOSIS — R319 Hematuria, unspecified: Principal | ICD-10-CM

## 2018-06-29 DIAGNOSIS — N390 Urinary tract infection, site not specified: Secondary | ICD-10-CM

## 2018-06-29 MED ORDER — NITROFURANTOIN MONOHYD MACRO 100 MG PO CAPS: 100.0000 mg | ORAL_CAPSULE | Freq: Two times a day (BID) | ORAL | 0 refills | Status: DC

## 2018-06-29 NOTE — Telephone Encounter (Signed)
LM stating that the urine culture from her pre op visit 7-25 showed that she has a UTI per Joylene John, NP.  Sending in [place] 100 mg bid for 5 days.  Begin today.

## 2018-06-29 NOTE — Telephone Encounter (Signed)
Spoke with Ms Andrea Hoffman and she understood the phone message and will be picking up the ATB shortly.  Told pt  to get in at least 64 oz a day of water to help flush out infection. Pt verbalized understanding.

## 2018-06-30 LAB — INHIBIN B: Inhibin B: 510.6 pg/mL — ABNORMAL HIGH (ref 0.0–16.9)

## 2018-06-30 MED ORDER — SODIUM CHLORIDE 0.9 % IV SOLN
2.0000 g | INTRAVENOUS | Status: AC
  Administered 2018-07-01: 2 g via INTRAVENOUS
  Filled 2018-06-30: qty 2

## 2018-06-30 NOTE — Anesthesia Preprocedure Evaluation (Signed)
Anesthesia Evaluation  Patient identified by MRN, date of birth, ID band Patient awake    Reviewed: Allergy & Precautions, H&P , NPO status , Patient's Chart, lab work & pertinent test results, reviewed documented beta blocker date and time   Airway Mallampati: II  TM Distance: >3 FB Neck ROM: full    Dental no notable dental hx.    Pulmonary    Pulmonary exam normal breath sounds clear to auscultation       Cardiovascular Exercise Tolerance: Good hypertension,  Rhythm:regular Rate:Normal     Neuro/Psych  Headaches, PSYCHIATRIC DISORDERS Depression    GI/Hepatic   Endo/Other  diabetes  Renal/GU      Musculoskeletal   Abdominal   Peds  Hematology  (+) anemia ,   Anesthesia Other Findings   Reproductive/Obstetrics                             Anesthesia Physical Anesthesia Plan  ASA: II  Anesthesia Plan: General   Post-op Pain Management:    Induction: Intravenous  PONV Risk Score and Plan: 3 and Ondansetron, Dexamethasone and Treatment may vary due to age or medical condition  Airway Management Planned: Oral ETT  Additional Equipment:   Intra-op Plan:   Post-operative Plan: Extubation in OR  Informed Consent: I have reviewed the patients History and Physical, chart, labs and discussed the procedure including the risks, benefits and alternatives for the proposed anesthesia with the patient or authorized representative who has indicated his/her understanding and acceptance.   Dental Advisory Given  Plan Discussed with: CRNA, Anesthesiologist and Surgeon  Anesthesia Plan Comments: (  )        Anesthesia Quick Evaluation

## 2018-07-01 ENCOUNTER — Ambulatory Visit (HOSPITAL_COMMUNITY): Payer: Medicare HMO | Admitting: Anesthesiology

## 2018-07-01 ENCOUNTER — Ambulatory Visit (HOSPITAL_COMMUNITY)
Admission: RE | Admit: 2018-07-01 | Discharge: 2018-07-02 | Disposition: A | Discharge status: Home / Self Care | Payer: Medicare HMO | Source: Ambulatory Visit | Attending: Obstetrics | Admitting: Obstetrics

## 2018-07-01 ENCOUNTER — Other Ambulatory Visit: Payer: Self-pay

## 2018-07-01 ENCOUNTER — Encounter (HOSPITAL_COMMUNITY)
Admission: RE | Disposition: A | Discharge status: Home / Self Care | Payer: Self-pay | Source: Ambulatory Visit | Attending: Obstetrics

## 2018-07-01 ENCOUNTER — Encounter (HOSPITAL_COMMUNITY): Payer: Self-pay | Admitting: *Deleted

## 2018-07-01 DIAGNOSIS — I10 Essential (primary) hypertension: Secondary | ICD-10-CM | POA: Diagnosis not present

## 2018-07-01 DIAGNOSIS — N3000 Acute cystitis without hematuria: Secondary | ICD-10-CM

## 2018-07-01 DIAGNOSIS — E875 Hyperkalemia: Secondary | ICD-10-CM

## 2018-07-01 DIAGNOSIS — Z885 Allergy status to narcotic agent status: Secondary | ICD-10-CM | POA: Insufficient documentation

## 2018-07-01 DIAGNOSIS — Z7984 Long term (current) use of oral hypoglycemic drugs: Secondary | ICD-10-CM | POA: Insufficient documentation

## 2018-07-01 DIAGNOSIS — D630 Anemia in neoplastic disease: Secondary | ICD-10-CM | POA: Diagnosis not present

## 2018-07-01 DIAGNOSIS — E119 Type 2 diabetes mellitus without complications: Secondary | ICD-10-CM | POA: Diagnosis not present

## 2018-07-01 DIAGNOSIS — D3911 Neoplasm of uncertain behavior of right ovary: Secondary | ICD-10-CM | POA: Diagnosis not present

## 2018-07-01 DIAGNOSIS — F329 Major depressive disorder, single episode, unspecified: Secondary | ICD-10-CM | POA: Diagnosis not present

## 2018-07-01 DIAGNOSIS — C541 Malignant neoplasm of endometrium: Secondary | ICD-10-CM | POA: Diagnosis not present

## 2018-07-01 DIAGNOSIS — R7989 Other specified abnormal findings of blood chemistry: Secondary | ICD-10-CM | POA: Diagnosis present

## 2018-07-01 DIAGNOSIS — N841 Polyp of cervix uteri: Secondary | ICD-10-CM | POA: Diagnosis not present

## 2018-07-01 DIAGNOSIS — E7800 Pure hypercholesterolemia, unspecified: Secondary | ICD-10-CM | POA: Diagnosis not present

## 2018-07-01 HISTORY — PX: ROBOTIC ASSISTED TOTAL HYSTERECTOMY WITH BILATERAL SALPINGO OOPHERECTOMY: SHX6086

## 2018-07-01 HISTORY — PX: LAPAROTOMY WITH STAGING: SHX5938

## 2018-07-01 HISTORY — PX: SENTINEL NODE BIOPSY: SHX6608

## 2018-07-01 LAB — CBC
HCT: 32.9 % — ABNORMAL LOW (ref 36.0–46.0)
HEMOGLOBIN: 9.7 g/dL — AB (ref 12.0–15.0)
MCH: 22.1 pg — ABNORMAL LOW (ref 26.0–34.0)
MCHC: 29.5 g/dL — AB (ref 30.0–36.0)
MCV: 75.1 fL — ABNORMAL LOW (ref 78.0–100.0)
PLATELETS: 293 10*3/uL (ref 150–400)
RBC: 4.38 MIL/uL (ref 3.87–5.11)
RDW: 15.8 % — ABNORMAL HIGH (ref 11.5–15.5)
WBC: 11.2 10*3/uL — ABNORMAL HIGH (ref 4.0–10.5)

## 2018-07-01 LAB — TYPE AND SCREEN
ABO/RH(D): O POS
Antibody Screen: NEGATIVE

## 2018-07-01 LAB — GLUCOSE, CAPILLARY
GLUCOSE-CAPILLARY: 200 mg/dL — AB (ref 70–99)
GLUCOSE-CAPILLARY: 176 mg/dL — AB (ref 70–99)
Glucose-Capillary: 117 mg/dL — ABNORMAL HIGH (ref 70–99)

## 2018-07-01 LAB — CREATININE, SERUM
CREATININE: 1.36 mg/dL — AB (ref 0.44–1.00)
GFR calc Af Amer: 44 mL/min — ABNORMAL LOW (ref >60)
GFR, EST NON AFRICAN AMERICAN: 38 mL/min — AB (ref >60)

## 2018-07-01 SURGERY — HYSTERECTOMY, TOTAL, ROBOT-ASSISTED, LAPAROSCOPIC, WITH BILATERAL SALPINGO-OOPHORECTOMY
Anesthesia: General

## 2018-07-01 MED ORDER — STERILE WATER FOR IRRIGATION IR SOLN: Status: DC | PRN

## 2018-07-01 MED ORDER — ONDANSETRON HCL 4 MG PO TABS: 4.0000 mg | ORAL_TABLET | Freq: Four times a day (QID) | ORAL | Status: DC | PRN

## 2018-07-01 MED ORDER — SIMVASTATIN 40 MG PO TABS: 40.0000 mg | ORAL_TABLET | Freq: Every day | ORAL | Status: DC

## 2018-07-01 MED ORDER — SCOPOLAMINE 1 MG/3DAYS TD PT72
1.0000 | MEDICATED_PATCH | TRANSDERMAL | Status: DC
  Administered 2018-07-01: 1.5 mg via TRANSDERMAL
  Filled 2018-07-01: qty 1

## 2018-07-01 MED ORDER — INSULIN ASPART 100 UNIT/ML ~~LOC~~ SOLN: 0.0000 [IU] | Freq: Every day | SUBCUTANEOUS | Status: DC

## 2018-07-01 MED ORDER — MEPERIDINE HCL 50 MG/ML IJ SOLN: 6.2500 mg | INTRAMUSCULAR | Status: DC | PRN

## 2018-07-01 MED ORDER — MIDAZOLAM HCL 2 MG/2ML IJ SOLN
INTRAMUSCULAR | Status: AC
  Filled 2018-07-01: qty 2

## 2018-07-01 MED ORDER — MIDAZOLAM HCL 5 MG/5ML IJ SOLN
INTRAMUSCULAR | Status: DC | PRN
  Administered 2018-07-01: 1 mg via INTRAVENOUS

## 2018-07-01 MED ORDER — AMLODIPINE BESYLATE 10 MG PO TABS
10.0000 mg | ORAL_TABLET | Freq: Every day | ORAL | Status: DC
  Administered 2018-07-02: 10 mg via ORAL
  Filled 2018-07-01: qty 1

## 2018-07-01 MED ORDER — ACETAMINOPHEN 500 MG PO TABS
1000.0000 mg | ORAL_TABLET | ORAL | Status: AC
  Administered 2018-07-01: 1000 mg via ORAL
  Filled 2018-07-01: qty 2

## 2018-07-01 MED ORDER — ONDANSETRON HCL 4 MG/2ML IJ SOLN
INTRAMUSCULAR | Status: AC
  Filled 2018-07-01: qty 2

## 2018-07-01 MED ORDER — SODIUM CHLORIDE 0.9 % IV SOLN
2.0000 g | Freq: Once | INTRAVENOUS | Status: AC
  Administered 2018-07-01: 2 g via INTRAVENOUS
  Filled 2018-07-01: qty 2

## 2018-07-01 MED ORDER — EPHEDRINE 5 MG/ML INJ
INTRAVENOUS | Status: AC
  Filled 2018-07-01: qty 10

## 2018-07-01 MED ORDER — FENTANYL CITRATE (PF) 100 MCG/2ML IJ SOLN
INTRAMUSCULAR | Status: AC
  Filled 2018-07-01: qty 2

## 2018-07-01 MED ORDER — POTASSIUM CHLORIDE IN NACL 20-0.45 MEQ/L-% IV SOLN
INTRAVENOUS | Status: DC
  Administered 2018-07-01 – 2018-07-02 (×2): via INTRAVENOUS
  Filled 2018-07-01 (×2): qty 1000

## 2018-07-01 MED ORDER — DEXAMETHASONE SODIUM PHOSPHATE 10 MG/ML IJ SOLN
INTRAMUSCULAR | Status: AC
  Filled 2018-07-01: qty 1

## 2018-07-01 MED ORDER — LIDOCAINE 2% (20 MG/ML) 5 ML SYRINGE
INTRAMUSCULAR | Status: AC
  Filled 2018-07-01: qty 5

## 2018-07-01 MED ORDER — GABAPENTIN 300 MG PO CAPS
300.0000 mg | ORAL_CAPSULE | ORAL | Status: AC
  Administered 2018-07-01: 300 mg via ORAL
  Filled 2018-07-01: qty 1

## 2018-07-01 MED ORDER — INSULIN ASPART 100 UNIT/ML ~~LOC~~ SOLN
0.0000 [IU] | Freq: Three times a day (TID) | SUBCUTANEOUS | Status: DC
  Administered 2018-07-01: 4 [IU] via SUBCUTANEOUS
  Administered 2018-07-02 (×2): 3 [IU] via SUBCUTANEOUS

## 2018-07-01 MED ORDER — ONDANSETRON HCL 4 MG/2ML IJ SOLN: 4.0000 mg | Freq: Four times a day (QID) | INTRAMUSCULAR | Status: DC | PRN

## 2018-07-01 MED ORDER — LACTATED RINGERS IV SOLN
INTRAVENOUS | Status: DC
  Administered 2018-07-01: 06:00:00 via INTRAVENOUS

## 2018-07-01 MED ORDER — LISINOPRIL 20 MG PO TABS
20.0000 mg | ORAL_TABLET | Freq: Every day | ORAL | Status: DC
  Administered 2018-07-01: 20 mg via ORAL
  Filled 2018-07-01: qty 1

## 2018-07-01 MED ORDER — ALLOPURINOL 100 MG PO TABS
100.0000 mg | ORAL_TABLET | Freq: Every day | ORAL | Status: DC
  Administered 2018-07-02: 100 mg via ORAL
  Filled 2018-07-01: qty 1

## 2018-07-01 MED ORDER — HYDROCODONE-ACETAMINOPHEN 5-325 MG PO TABS
1.0000 | ORAL_TABLET | ORAL | Status: DC | PRN
Start: 2018-07-01 — End: 2018-07-01

## 2018-07-01 MED ORDER — NITROFURANTOIN MONOHYD MACRO 100 MG PO CAPS
100.0000 mg | ORAL_CAPSULE | Freq: Two times a day (BID) | ORAL | Status: DC
  Administered 2018-07-01 – 2018-07-02 (×2): 100 mg via ORAL
  Filled 2018-07-01 (×2): qty 1

## 2018-07-01 MED ORDER — HYDROCHLOROTHIAZIDE 25 MG PO TABS
25.0000 mg | ORAL_TABLET | Freq: Every day | ORAL | Status: DC
  Administered 2018-07-01 – 2018-07-02 (×2): 25 mg via ORAL
  Filled 2018-07-01 (×2): qty 1

## 2018-07-01 MED ORDER — LIDOCAINE 2% (20 MG/ML) 5 ML SYRINGE
INTRAMUSCULAR | Status: DC | PRN
  Administered 2018-07-01: 50 mg via INTRAVENOUS

## 2018-07-01 MED ORDER — ONDANSETRON HCL 4 MG/2ML IJ SOLN
INTRAMUSCULAR | Status: DC | PRN
  Administered 2018-07-01: 4 mg via INTRAVENOUS

## 2018-07-01 MED ORDER — LIDOCAINE 2% (20 MG/ML) 5 ML SYRINGE
INTRAMUSCULAR | Status: DC | PRN
  Administered 2018-07-01: 1 mg/kg/h via INTRAVENOUS

## 2018-07-01 MED ORDER — METFORMIN HCL 500 MG PO TABS
1000.0000 mg | ORAL_TABLET | Freq: Every day | ORAL | Status: DC
  Administered 2018-07-01: 1000 mg via ORAL
  Filled 2018-07-01: qty 2

## 2018-07-01 MED ORDER — KETOROLAC TROMETHAMINE 15 MG/ML IJ SOLN
15.0000 mg | Freq: Four times a day (QID) | INTRAMUSCULAR | Status: DC
  Administered 2018-07-01 – 2018-07-02 (×3): 15 mg via INTRAVENOUS
  Filled 2018-07-01 (×3): qty 1

## 2018-07-01 MED ORDER — DEXAMETHASONE SODIUM PHOSPHATE 4 MG/ML IJ SOLN: 4.0000 mg | INTRAMUSCULAR | Status: DC

## 2018-07-01 MED ORDER — ACETAMINOPHEN 325 MG PO TABS: 650.0000 mg | ORAL_TABLET | Freq: Four times a day (QID) | ORAL | Status: DC | PRN

## 2018-07-01 MED ORDER — FENTANYL CITRATE (PF) 100 MCG/2ML IJ SOLN
INTRAMUSCULAR | Status: DC | PRN
  Administered 2018-07-01 (×2): 50 ug via INTRAVENOUS
  Administered 2018-07-01: 25 ug via INTRAVENOUS

## 2018-07-01 MED ORDER — PROPOFOL 10 MG/ML IV BOLUS
INTRAVENOUS | Status: AC
  Filled 2018-07-01: qty 20

## 2018-07-01 MED ORDER — METFORMIN HCL 500 MG PO TABS: 1000.0000 mg | ORAL_TABLET | ORAL | Status: DC

## 2018-07-01 MED ORDER — ROCURONIUM BROMIDE 10 MG/ML (PF) SYRINGE
PREFILLED_SYRINGE | INTRAVENOUS | Status: DC | PRN
  Administered 2018-07-01: 10 mg via INTRAVENOUS
  Administered 2018-07-01: 20 mg via INTRAVENOUS
  Administered 2018-07-01: 50 mg via INTRAVENOUS
  Administered 2018-07-01: 5 mg via INTRAVENOUS

## 2018-07-01 MED ORDER — FENTANYL CITRATE (PF) 100 MCG/2ML IJ SOLN
25.0000 ug | INTRAMUSCULAR | Status: DC | PRN
  Administered 2018-07-01 (×2): 50 ug via INTRAVENOUS

## 2018-07-01 MED ORDER — OXYCODONE HCL 5 MG PO TABS
5.0000 mg | ORAL_TABLET | Freq: Four times a day (QID) | ORAL | Status: DC | PRN
  Administered 2018-07-01: 5 mg via ORAL
  Filled 2018-07-01: qty 1

## 2018-07-01 MED ORDER — NITROGLYCERIN 0.4 MG SL SUBL: 0.4000 mg | SUBLINGUAL_TABLET | SUBLINGUAL | Status: DC | PRN

## 2018-07-01 MED ORDER — ENOXAPARIN SODIUM 40 MG/0.4ML ~~LOC~~ SOLN
40.0000 mg | SUBCUTANEOUS | Status: DC
  Administered 2018-07-02: 40 mg via SUBCUTANEOUS
  Filled 2018-07-01: qty 0.4

## 2018-07-01 MED ORDER — GABAPENTIN 300 MG PO CAPS
300.0000 mg | ORAL_CAPSULE | Freq: Two times a day (BID) | ORAL | Status: DC
  Administered 2018-07-01 – 2018-07-02 (×2): 300 mg via ORAL
  Filled 2018-07-01 (×3): qty 1

## 2018-07-01 MED ORDER — ALPHA-LIPOIC ACID 200 MG PO CAPS: 200.0000 mg | ORAL_CAPSULE | ORAL | Status: DC

## 2018-07-01 MED ORDER — STERILE WATER FOR INJECTION IJ SOLN
INTRAMUSCULAR | Status: DC | PRN
  Administered 2018-07-01: 10 mL

## 2018-07-01 MED ORDER — PROPOFOL 10 MG/ML IV BOLUS
INTRAVENOUS | Status: DC | PRN
  Administered 2018-07-01: 100 mg via INTRAVENOUS

## 2018-07-01 MED ORDER — EPHEDRINE SULFATE-NACL 50-0.9 MG/10ML-% IV SOSY
PREFILLED_SYRINGE | INTRAVENOUS | Status: DC | PRN
  Administered 2018-07-01 (×3): 5 mg via INTRAVENOUS

## 2018-07-01 MED ORDER — FENTANYL CITRATE (PF) 250 MCG/5ML IJ SOLN
INTRAMUSCULAR | Status: AC
  Filled 2018-07-01: qty 5

## 2018-07-01 MED ORDER — SODIUM CHLORIDE 0.9 % IR SOLN
Status: DC | PRN
  Administered 2018-07-01: 1000 mL

## 2018-07-01 MED ORDER — SUGAMMADEX SODIUM 200 MG/2ML IV SOLN
INTRAVENOUS | Status: DC | PRN
  Administered 2018-07-01: 200 mg via INTRAVENOUS

## 2018-07-01 MED ORDER — SUGAMMADEX SODIUM 200 MG/2ML IV SOLN
INTRAVENOUS | Status: AC
  Filled 2018-07-01: qty 2

## 2018-07-01 MED ORDER — ROCURONIUM BROMIDE 10 MG/ML (PF) SYRINGE
PREFILLED_SYRINGE | INTRAVENOUS | Status: AC
  Filled 2018-07-01: qty 10

## 2018-07-01 MED ORDER — LIDOCAINE 2% (20 MG/ML) 5 ML SYRINGE
INTRAMUSCULAR | Status: AC
  Filled 2018-07-01: qty 10

## 2018-07-01 MED ORDER — CARVEDILOL 6.25 MG PO TABS
6.2500 mg | ORAL_TABLET | Freq: Two times a day (BID) | ORAL | Status: DC
  Administered 2018-07-01 – 2018-07-02 (×2): 6.25 mg via ORAL
  Filled 2018-07-01 (×2): qty 1

## 2018-07-01 MED ORDER — ATORVASTATIN CALCIUM 20 MG PO TABS
20.0000 mg | ORAL_TABLET | Freq: Every day | ORAL | Status: DC
  Administered 2018-07-01: 20 mg via ORAL
  Filled 2018-07-01: qty 1

## 2018-07-01 MED ORDER — STERILE WATER FOR INJECTION IJ SOLN
INTRAMUSCULAR | Status: AC
  Filled 2018-07-01: qty 10

## 2018-07-01 MED ORDER — HYDROMORPHONE HCL 1 MG/ML IJ SOLN: 0.5000 mg | INTRAMUSCULAR | Status: DC | PRN

## 2018-07-01 MED ORDER — METFORMIN HCL 500 MG PO TABS
1500.0000 mg | ORAL_TABLET | Freq: Every day | ORAL | Status: DC
  Administered 2018-07-02: 1500 mg via ORAL
  Filled 2018-07-01: qty 3

## 2018-07-01 MED ORDER — CITALOPRAM HYDROBROMIDE 40 MG PO TABS
40.0000 mg | ORAL_TABLET | Freq: Every day | ORAL | Status: DC
  Administered 2018-07-01: 40 mg via ORAL
  Filled 2018-07-01: qty 1
  Filled 2018-07-01: qty 2

## 2018-07-01 MED ORDER — DEXAMETHASONE SODIUM PHOSPHATE 10 MG/ML IJ SOLN
INTRAMUSCULAR | Status: DC | PRN
  Administered 2018-07-01: 10 mg via INTRAVENOUS

## 2018-07-01 SURGICAL SUPPLY — 75 items
AGENT HMST KT MTR STRL THRMB (HEMOSTASIS)
APL ESCP 34 STRL LF DISP (HEMOSTASIS)
APL SKNCLS STERI-STRIP NONHPOA (GAUZE/BANDAGES/DRESSINGS) ×2
APL SWBSTK 6 STRL LF DISP (MISCELLANEOUS)
APPLICATOR COTTON TIP 6 STRL (MISCELLANEOUS) ×2 IMPLANT
APPLICATOR COTTON TIP 6IN STRL (MISCELLANEOUS)
APPLICATOR SURGIFLO ENDO (HEMOSTASIS) IMPLANT
BAG LAPAROSCOPIC 12 15 PORT 16 (BASKET) IMPLANT
BAG RETRIEVAL 12/15 (BASKET)
BAG RETRIEVAL 12/15MM (BASKET)
BAG SPEC RTRVL LRG 6X4 10 (ENDOMECHANICALS) ×2
BENZOIN TINCTURE PRP APPL 2/3 (GAUZE/BANDAGES/DRESSINGS) ×4 IMPLANT
CLOSURE WOUND 1/2 X4 (GAUZE/BANDAGES/DRESSINGS) ×1
COVER BACK TABLE 60X90IN (DRAPES) ×4 IMPLANT
COVER TIP SHEARS 8 DVNC (MISCELLANEOUS) ×2 IMPLANT
COVER TIP SHEARS 8MM DA VINCI (MISCELLANEOUS) ×2
DRAPE ARM DVNC X/XI (DISPOSABLE) ×8 IMPLANT
DRAPE COLUMN DVNC XI (DISPOSABLE) ×2 IMPLANT
DRAPE DA VINCI XI ARM (DISPOSABLE) ×8
DRAPE DA VINCI XI COLUMN (DISPOSABLE) ×2
DRAPE SHEET LG 3/4 BI-LAMINATE (DRAPES) ×4 IMPLANT
DRAPE SURG IRRIG POUCH 19X23 (DRAPES) ×4 IMPLANT
DRAPE UNDERBUTTOCKS STRL (DRAPE) ×4 IMPLANT
ELECT REM PT RETURN 15FT ADLT (MISCELLANEOUS) ×4 IMPLANT
GLOVE BIO SURGEON STRL SZ 6 (GLOVE) ×4 IMPLANT
GLOVE BIO SURGEON STRL SZ 6.5 (GLOVE) IMPLANT
GLOVE BIO SURGEONS STRL SZ 6.5 (GLOVE)
GLOVE BIOGEL PI IND STRL 7.0 (GLOVE) ×6 IMPLANT
GLOVE BIOGEL PI INDICATOR 7.0 (GLOVE) ×6
GLOVE SURG SS PI 6.5 STRL IVOR (GLOVE) ×12 IMPLANT
GOWN STRL REUS W/ TWL LRG LVL3 (GOWN DISPOSABLE) ×2 IMPLANT
GOWN STRL REUS W/TWL LRG LVL3 (GOWN DISPOSABLE) ×4
GOWN STRL REUS W/TWL XL LVL3 (GOWN DISPOSABLE) ×8 IMPLANT
GYRUS RUMI II 2.5CM BLUE (DISPOSABLE)
GYRUS RUMI II 3.5CM BLUE (DISPOSABLE) ×4
HOLDER FOLEY CATH W/STRAP (MISCELLANEOUS) ×4 IMPLANT
IRRIG SUCT STRYKERFLOW 2 WTIP (MISCELLANEOUS) ×4
IRRIGATION SUCT STRKRFLW 2 WTP (MISCELLANEOUS) ×2 IMPLANT
KIT PROCEDURE DA VINCI SI (MISCELLANEOUS) ×2
KIT PROCEDURE DVNC SI (MISCELLANEOUS) IMPLANT
MANIPULATOR UTERINE 4.5 ZUMI (MISCELLANEOUS) IMPLANT
NDL SPNL 18GX3.5 QUINCKE PK (NEEDLE) IMPLANT
NEEDLE SPNL 18GX3.5 QUINCKE PK (NEEDLE) ×4 IMPLANT
OBTURATOR OPTICAL STANDARD 8MM (TROCAR) ×2
OBTURATOR OPTICAL STND 8 DVNC (TROCAR) ×2
OBTURATOR OPTICALSTD 8 DVNC (TROCAR) ×2 IMPLANT
PACK ROBOT GYN CUSTOM WL (TRAY / TRAY PROCEDURE) ×4 IMPLANT
PAD POSITIONING PINK XL (MISCELLANEOUS) ×4 IMPLANT
POUCH SPECIMEN RETRIEVAL 10MM (ENDOMECHANICALS) ×2 IMPLANT
RUMI II 3.0CM BLUE KOH-EFFICIE (DISPOSABLE) IMPLANT
RUMI II GYRUS 2.5CM BLUE (DISPOSABLE) IMPLANT
RUMI II GYRUS 3.5CM BLUE (DISPOSABLE) IMPLANT
SCISSORS LAP 5X45 EPIX DISP (ENDOMECHANICALS) ×2 IMPLANT
SEAL CANN UNIV 5-8 DVNC XI (MISCELLANEOUS) ×8 IMPLANT
SEAL XI 5MM-8MM UNIVERSAL (MISCELLANEOUS) ×8
SET TRI-LUMEN FLTR TB AIRSEAL (TUBING) ×4 IMPLANT
STRIP CLOSURE SKIN 1/2X4 (GAUZE/BANDAGES/DRESSINGS) ×3 IMPLANT
SURGIFLO W/THROMBIN 8M KIT (HEMOSTASIS) IMPLANT
SUT VIC AB 0 CT1 27 (SUTURE)
SUT VIC AB 0 CT1 27XBRD ANTBC (SUTURE) IMPLANT
SUT VIC AB 4-0 P2 18 (SUTURE) ×4 IMPLANT
SUT VIC AB 4-0 PS2 27 (SUTURE) ×4 IMPLANT
SUT VLOC 180 0 9IN  GS21 (SUTURE) ×4
SUT VLOC 180 0 9IN GS21 (SUTURE) ×4 IMPLANT
SYR 10ML LL (SYRINGE) ×2 IMPLANT
TIP RUMI ORANGE 6.7MMX12CM (TIP) IMPLANT
TIP UTERINE 5.1X6CM LAV DISP (MISCELLANEOUS) IMPLANT
TIP UTERINE 6.7X10CM GRN DISP (MISCELLANEOUS) IMPLANT
TIP UTERINE 6.7X6CM WHT DISP (MISCELLANEOUS) IMPLANT
TIP UTERINE 6.7X8CM BLUE DISP (MISCELLANEOUS) ×2 IMPLANT
TOWEL OR NON WOVEN STRL DISP B (DISPOSABLE) ×4 IMPLANT
TRAP SPECIMEN MUCOUS 40CC (MISCELLANEOUS) ×2 IMPLANT
TRAY FOLEY MTR SLVR 16FR STAT (SET/KITS/TRAYS/PACK) ×4 IMPLANT
UNDERPAD 30X30 (UNDERPADS AND DIAPERS) ×4 IMPLANT
WATER STERILE IRR 1000ML POUR (IV SOLUTION) ×2 IMPLANT

## 2018-07-01 NOTE — Anesthesia Procedure Notes (Signed)
Procedure Name: Intubation Date/Time: 07/01/2018 7:45 AM Performed by: Victoriano Lain, CRNA Pre-anesthesia Checklist: Patient identified, Emergency Drugs available, Suction available, Patient being monitored and Timeout performed Patient Re-evaluated:Patient Re-evaluated prior to induction Oxygen Delivery Method: Circle system utilized Preoxygenation: Pre-oxygenation with 100% oxygen Induction Type: IV induction Ventilation: Mask ventilation without difficulty Laryngoscope Size: Mac and 4 Grade View: Grade I Tube type: Oral Tube size: 7.5 mm Number of attempts: 1 Airway Equipment and Method: Stylet Placement Confirmation: ETT inserted through vocal cords under direct vision,  positive ETCO2 and breath sounds checked- equal and bilateral Secured at: 21 cm Tube secured with: Tape Dental Injury: Teeth and Oropharynx as per pre-operative assessment

## 2018-07-01 NOTE — Transfer of Care (Signed)
Immediate Anesthesia Transfer of Care Note  Patient: Andrea Hoffman  Procedure(s) Performed: XI ROBOTIC ASSISTED TOTAL HYSTERECTOMY WITH BILATERAL SALPINGO OOPHORECTOMY (Bilateral ) SENTINEL NODE BIOPSY (N/A ) POSSIBLE LAPAROTOMY WITH STAGING (N/A )  Patient Location: PACU  Anesthesia Type:General  Level of Consciousness: awake, alert , oriented and patient cooperative  Airway & Oxygen Therapy: Patient Spontanous Breathing and Patient connected to face mask oxygen  Post-op Assessment: Report given to RN, Post -op Vital signs reviewed and stable and Patient moving all extremities  Post vital signs: Reviewed and stable  Last Vitals:  Vitals Value Taken Time  BP 136/81 07/01/2018  1:08 PM  Temp    Pulse 95 07/01/2018  1:12 PM  Resp 23 07/01/2018  1:12 PM  SpO2 94 % 07/01/2018  1:12 PM  Vitals shown include unvalidated device data.  Last Pain:  Vitals:   07/01/18 0558  TempSrc:   PainSc: 0-No pain      Patients Stated Pain Goal: 3 ([id number])  Complications: No apparent anesthesia complications

## 2018-07-01 NOTE — Anesthesia Postprocedure Evaluation (Signed)
Anesthesia Post Note  Patient: Andrea Hoffman  Procedure(s) Performed: XI ROBOTIC ASSISTED TOTAL HYSTERECTOMY WITH BILATERAL SALPINGO OOPHORECTOMY (Bilateral ) SENTINEL NODE BIOPSY (N/A ) POSSIBLE LAPAROTOMY WITH STAGING (N/A )     Patient location during evaluation: PACU Anesthesia Type: General Level of consciousness: awake and alert Pain management: pain level controlled Vital Signs Assessment: post-procedure vital signs reviewed and stable Respiratory status: spontaneous breathing, nonlabored ventilation, respiratory function stable and patient connected to nasal cannula oxygen Cardiovascular status: blood pressure returned to baseline and stable Postop Assessment: no apparent nausea or vomiting Anesthetic complications: no    Last Vitals:  Vitals:   07/01/18 1400 07/01/18 1415  BP: 132/72   Pulse: 95   Resp: 12 12  Temp:    SpO2: 93% 94%    Last Pain:  Vitals:   07/01/18 1400  TempSrc:   PainSc: 3                  Carnell Casamento

## 2018-07-01 NOTE — Interval H&P Note (Signed)
History and Physical Interval Note:  07/01/2018 7:10 AM  Andrea Hoffman  has presented today for surgery, with the diagnosis of ENDOMETRAL CANCER,BILATERAL OVARIAN MASSES  The various methods of treatment have been discussed with the patient and family. After consideration of risks, benefits and other options for treatment, the patient has consented to  Procedure(s): XI ROBOTIC ASSISTED TOTAL HYSTERECTOMY WITH BILATERAL SALPINGO OOPHORECTOMY (Bilateral) SENTINEL NODE BIOPSY (N/A) POSSIBLE LAPAROTOMY WITH STAGING (N/A) as a surgical intervention .  The patient's history has been reviewed, patient examined, no change in status, stable for surgery.  I have reviewed the patient's chart and labs.  Questions were answered to the patient's satisfaction.     [name]

## 2018-07-01 NOTE — Progress Notes (Signed)
PHARMACIST - PHYSICIAN ORDER COMMUNICATION  CONCERNING: P&T Medication Policy on Herbal Medications  DESCRIPTION:  This patient's order for:  Alpha Lipoic Acid  has been noted.  This product(s) is classified as an natural product. Due to a lack of definitive safety studies or FDA approval, nonstandard manufacturing practices, plus the potential risk of unknown drug-drug interactions while on inpatient medications, the Pharmacy and Therapeutics Committee does not permit the use of "herbal" or natural products of this type within [facility].   ACTION TAKEN: The pharmacy department is unable to verify this order at this time and your patient has been informed of this safety policy. Please reevaluate patient's clinical condition at discharge and address if the herbal or natural product(s) should be resumed at that time.

## 2018-07-01 NOTE — Op Note (Signed)
OPERATIVE NOTE  Surgeon: Mart Piggs, MD  Assistants: Everitt Amber, MD (an MD assistant was necessary for tissue manipulation, management of robotic instrumentation, retraction and positioning due to the complexity of the case and hospital policies).   Pre-operative Diagnosis:  1. Endometrial cancer grade 1 2. Pelvic mass  Post-operative Diagnosis:  1. Endometrial cancer 2. 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 Robotic-assisted laparoscopic total hysterectomy with bilateral salpingoophorectomy 2. Pelvic lymphadenectomy and SLN biopsy 3. Pelvic washings 4. Omentectomy (partial)  Anesthesia: General endotracheal anesthesia   Findings: Adhesions of omentum to anterior abdominal wall. Normal sized uterus. Normal appearing left ovary. Right ovary enlarged and firm; bilobed. Frozen on uterus >50% depth of invasion. 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 Sentinel uptake left and right external iliac vessels, both proximal and distal, so 4 total sentinel lymph nodes. Mild adenopathy in right proximal external node.  Estimated Blood Loss:  less than 50 mL             Specimens: Washings, uterus, cervix, bilateral tubes and ovaries, bilateral sentinel lymph nodes, bilateral pelvic nodes. Infracolic omentum.         Complications:  None; patient tolerated the procedure well.         Disposition: PACU - hemodynamically stable.  Procedure Details  After induction of anesthesia the patient was placed in lithotomy position. Her arms were tucked to her side with all appropriate precautions.  The shoulders were stabilized with padded shoulder blocks applied to the acromium processes.  The perineum was prepped with Betadine. CholoraPrep was used to prep the abdomen and allowed to dry for 3 minutes.  The patient was then draped.   A time out was performed. A Foley catheter was placed by me.  A sterile speculum was placed in  the vagina.  The cervix was grasped with a single-tooth tenaculum. Uterus was sounded to 8cm. 2mg  total of ICG was injected into the cervical stroma at 3 and 9 o'clock with 1cc injected at a 1cm and 30mm depth (concentration 0.5mg /ml) in all locations. The cervix was dilated with Kennon Rounds dilators.  The RUMI II uterine manipulator with a 3.5 medium colpotomizer ring and length of 8cm was placed without difficulty.  OG tube placement was confirmed.   Next, a 10 mm skin incision was made 2 cm below the subcostal margin just medial to the midclavicular line.  The 5 mm Optiview port and scope was used for direct entry.  Opening pressure was under 10 mm CO2.  The abdomen was insufflated and the findings were noted as above.   At this point and all points during the procedure, the patient's intra-abdominal pressure did not exceed 15 mmHg. The left lateral 62mm robotic trocar was placed under direct visualization. Omentum was adherent under the prior incision and taken down with sharp and cautery dissection.  The patient was placed in steep Trendelenburg.  Next, an 25mm skin incision was made lateral to the umbilicus at prior incision and the umbilical trocar was inserted. The remaining right trocars were placed about 6-8 cm lateral to the robot port on the right side.  A fourth arm was placed on the right lateral abdomen, 6-8cm from the last. The 70mm Optiview was changed out for a 65mm Airseal. All ports were placed under direct visualization.  Bowel was folded away into the upper abdomen.  Raytec(s) x 2 placed. The robot was docked in the normal manner.  The  round ligaments were elevated, coagulated, and transected. The right and left peritoneum were opened parallel to the IP ligament to open the retroperitoneal spaces bilaterally. The SLN mapping was performed in bilateral pelvic basins. The para rectal and paravesical spaces were opened up entirely with careful dissection below the level of the ureters bilaterally and  to the depth of the uterine artery origin in order to skeletonize the uterine "web" and ensure visualization of all parametrial channels. The para-aortic basins were carefully exposed and evaluated for isolated para-aortic SLN's. Lymphatic channels were identified travelling to the sentinel lymph nodes as noted in the findings. These SLN's were separated from their surrounding lymphatic tissue, removed and sent for permanent pathology.  The hysterectomy was initiated. The ureter was noted to be on the medial leaf of the broad ligament.  The peritoneum above the ureter was incised and stretched and the infundibulopelvic ligament was skeletonized, cauterized and transected.  The posterior peritoneum was taken down to the level of the KOH ring.  The anterior peritoneum was also taken down.  The bladder flap was created to the level of the KOH ring.  The uterine artery on the was skeletonized, cauterized, and transected in the normal manner.  A similar procedure was performed on the contralateral side. The colpotomy was made and the uterus, cervix, bilateral ovaries and tubes were amputated and delivered through the vagina.  The right adnexa was separated from the uterus at the uteroovarian ligament as it would not deliver easily. The adnexa was placed into a 36mm endocatch bag and delivered vaginally. The pedicles were inspected and excellent hemostasis was achieved.    The uterus and ovary were sent for frozen section.  The colpotomy at the vaginal cuff was closed with two 9-inch V-loc sutures, starting at either end, meeting in the midline and traveling back for another 2 bites. The suture was cut and the needles removed through the 62mm airseal trocar. Irrigation was used and excellent hemostasis was achieved.    Frozen section returned as in the findings.  The paravesical spaces and pararectal spaces had been previously developed and were further opened with monopolar and sharp dissection. The spaces were  held open with tension on the median umbilical ligament with the fourth arm and assist in the pararectal space. The paravesical space was opened with blunt and sharp dissection to mobilize the ureter off of the medial surface of the internal iliac artery. The medial leaf of the broad ligament containing the ureter was held medially (opening the pararectal space) by the assistant's grasper.   The pelvic lymphadenectomy was performed by skeletonizing the internal iliac artery at the bifurcation with the external iliac artery. The obturator nerve was identified in the base of lateral paravesical space. The ureter was mobilized medially off of the dissection by developing the pararectal space. The genitofemoral nerve was identified, skeletonized and mobilized laterally off of the external iliac artery. Resection of lymph nodes was performed within the following boundaries: the mid portion of the common iliac proximally, the circumflex iliac vein distally, the obturator nerve posteriorally, the genitofemoral nerve laterally. The nodal basin (including obturator space) were confirmed to be empty of nodes and hemostatic. The nodes were placed in endocatch bags and retrieved through the RUQ trocar site.  Omentectomy was performed, taking a portion of the infracolic omentum using the bipolar and scissors. The omentum was placed into a 58mm Endocatch bag. The robot was undocked. The omentum was delivered through the RUQ trocar.   Robotic instruments  were removed. The 6mm port fascia was closed with a 0-vicryl in the usual fashion. The skin was closed with 4-0 Vicryl in the dermis. Then 4-0 monocryl was used in a subcuticular manner.  Dermabond was applied.    The balloon was removed from the vagina and no bleeding noted. Sponge, lap and needle counts correct x 2.  The patient was taken to the recovery room in stable condition.

## 2018-07-02 ENCOUNTER — Other Ambulatory Visit: Payer: Self-pay | Admitting: Gynecologic Oncology

## 2018-07-02 ENCOUNTER — Encounter (HOSPITAL_COMMUNITY): Payer: Self-pay | Admitting: Obstetrics

## 2018-07-02 DIAGNOSIS — E875 Hyperkalemia: Secondary | ICD-10-CM | POA: Diagnosis not present

## 2018-07-02 DIAGNOSIS — R7989 Other specified abnormal findings of blood chemistry: Secondary | ICD-10-CM | POA: Diagnosis present

## 2018-07-02 DIAGNOSIS — D3911 Neoplasm of uncertain behavior of right ovary: Secondary | ICD-10-CM | POA: Diagnosis not present

## 2018-07-02 DIAGNOSIS — C541 Malignant neoplasm of endometrium: Secondary | ICD-10-CM | POA: Diagnosis not present

## 2018-07-02 DIAGNOSIS — D630 Anemia in neoplastic disease: Secondary | ICD-10-CM | POA: Diagnosis not present

## 2018-07-02 LAB — BASIC METABOLIC PANEL
ANION GAP: 9 (ref 5–15)
Anion gap: 9 (ref 5–15)
BUN: 24 mg/dL — ABNORMAL HIGH (ref 8–23)
BUN: 24 mg/dL — AB (ref 8–23)
CALCIUM: 8.9 mg/dL (ref 8.9–10.3)
CHLORIDE: 102 mmol/L (ref 98–111)
CO2: 24 mmol/L (ref 22–32)
CO2: 25 mmol/L (ref 22–32)
CREATININE: 1.35 mg/dL — AB (ref 0.44–1.00)
Calcium: 8.8 mg/dL — ABNORMAL LOW (ref 8.9–10.3)
Chloride: 104 mmol/L (ref 98–111)
Creatinine, Ser: 1.31 mg/dL — ABNORMAL HIGH (ref 0.44–1.00)
GFR calc Af Amer: 46 mL/min — ABNORMAL LOW (ref >60)
GFR calc Af Amer: 44 mL/min — ABNORMAL LOW (ref >60)
GFR calc non Af Amer: 38 mL/min — ABNORMAL LOW (ref >60)
GFR, EST NON AFRICAN AMERICAN: 40 mL/min — AB (ref >60)
Glucose, Bld: 141 mg/dL — ABNORMAL HIGH (ref 70–99)
Glucose, Bld: 150 mg/dL — ABNORMAL HIGH (ref 70–99)
Potassium: 6.7 mmol/L (ref 3.5–5.1)
Potassium: 5.3 mmol/L — ABNORMAL HIGH (ref 3.5–5.1)
SODIUM: 136 mmol/L (ref 135–145)
Sodium: 137 mmol/L (ref 135–145)

## 2018-07-02 LAB — CBC
HCT: 28.4 % — ABNORMAL LOW (ref 36.0–46.0)
HEMOGLOBIN: 8.6 g/dL — AB (ref 12.0–15.0)
MCH: 22.1 pg — ABNORMAL LOW (ref 26.0–34.0)
MCHC: 30.3 g/dL (ref 30.0–36.0)
MCV: 72.8 fL — ABNORMAL LOW (ref 78.0–100.0)
Platelets: 275 10*3/uL (ref 150–400)
RBC: 3.9 MIL/uL (ref 3.87–5.11)
RDW: 15.9 % — ABNORMAL HIGH (ref 11.5–15.5)
WBC: 9.2 10*3/uL (ref 4.0–10.5)

## 2018-07-02 LAB — GLUCOSE, CAPILLARY
GLUCOSE-CAPILLARY: 138 mg/dL — AB (ref 70–99)
GLUCOSE-CAPILLARY: 145 mg/dL — AB (ref 70–99)
Glucose-Capillary: 140 mg/dL — ABNORMAL HIGH (ref 70–99)

## 2018-07-02 LAB — HEMOGLOBIN AND HEMATOCRIT, BLOOD
HCT: 30.4 % — ABNORMAL LOW (ref 36.0–46.0)
HEMOGLOBIN: 8.9 g/dL — AB (ref 12.0–15.0)

## 2018-07-02 MED ORDER — SODIUM CHLORIDE 0.9 % IV SOLN
INTRAVENOUS | Status: DC
  Administered 2018-07-02: 10:00:00 via INTRAVENOUS

## 2018-07-02 MED ORDER — CALCIUM GLUCONATE 500 MG PO TABS
1.0000 | ORAL_TABLET | Freq: Two times a day (BID) | ORAL | Status: DC
  Filled 2018-07-02: qty 1

## 2018-07-02 MED ORDER — TRAMADOL HCL 50 MG PO TABS: 50.0000 mg | ORAL_TABLET | Freq: Four times a day (QID) | ORAL | 0 refills | Status: DC | PRN

## 2018-07-02 MED ORDER — SIMVASTATIN 40 MG PO TABS: 20.0000 mg | ORAL_TABLET | Freq: Every day | ORAL | Status: DC

## 2018-07-02 MED ORDER — CITALOPRAM HYDROBROMIDE 40 MG PO TABS: 20.0000 mg | ORAL_TABLET | Freq: Every day | ORAL | Status: DC

## 2018-07-02 MED ORDER — METFORMIN HCL 500 MG PO TABS: 500.0000 mg | ORAL_TABLET | Freq: Two times a day (BID) | ORAL | Status: DC

## 2018-07-02 NOTE — Discharge Summary (Addendum)
Physician Discharge Summary  Patient ID: Andrea Hoffman MRN: [id number] DOB/AGE: [birth year] 72 y.o.  Admit date: 07/01/2018 Discharge date: 07/02/2018  Admission Diagnoses: Endometrial cancer [facility])  Discharge Diagnoses:  Principal Problem:   Endometrial cancer ([place]) Active Problems:   Elevated serum creatinine   Discharged Condition:  The patient is in good condition and stable for discharge.   Hospital Course: On 07/01/2018, the patient underwent the following: Procedure(s): XI ROBOTIC ASSISTED TOTAL HYSTERECTOMY WITH BILATERAL SALPINGO OOPHORECTOMY SENTINEL NODE BIOPSY POSSIBLE LAPAROTOMY WITH STAGING.  The postoperative course was uneventful except for hyperkalemia on POD1 in the am that was resolving on recheck at 5.3.  She was discharged to home on postoperative day 1 tolerating a regular diet, ambulating, voiding adequate amounts, minimal pain, serum potassium improved to 5.3.  Consults: None  Significant Diagnostic Studies: None  Treatments: surgery: see above, IV hydration  Discharge Exam: Blood pressure (!) 143/72, pulse (!) 105, temperature 98.1 F (36.7 C), temperature source Oral, resp. rate 18, height 5\' 5"  (1.651 m), weight 224 lb 3.3 oz (101.7 kg), SpO2 94 %. General appearance: alert, cooperative and no distress Resp: clear to auscultation bilaterally Cardio: regular rate and rhythm, S1, S2 normal, no murmur, click, rub or gallop GI: soft, non-tender; bowel sounds normal; no masses,  no organomegaly and abdomen obese Extremities: extremities normal, atraumatic, no cyanosis or edema Incision/Wound: Lap sites to the abdomen with dermabond without drainage.  Mild ecchymosis noted around upper abdominal incision  Disposition: Discharge disposition: 01-Home or Self Care       Discharge Instructions    Call MD for:  difficulty breathing, headache or visual disturbances   Complete by:  As directed    Call MD for:  extreme fatigue   Complete by:  As directed     Call MD for:  hives   Complete by:  As directed    Call MD for:  persistant dizziness or light-headedness   Complete by:  As directed    Call MD for:  persistant nausea and vomiting   Complete by:  As directed    Call MD for:  redness, tenderness, or signs of infection (pain, swelling, redness, odor or green/yellow discharge around incision site)   Complete by:  As directed    Call MD for:  severe uncontrolled pain   Complete by:  As directed    Call MD for:  temperature >100.4   Complete by:  As directed    Diet - low sodium heart healthy   Complete by:  As directed    Discharge instructions   Complete by:  As directed    See changes in home meds..celebrex to 20 mg once daily and metformin to 500 mg in the am and 500 mg in the pm due to elevated creatinine level in hospital.  We will recheck your level when we see you for your post-op appointment.   Driving Restrictions   Complete by:  As directed    No driving for 2 weeks if cleared to drive before surgery.  Do not take narcotics and drive.   Increase activity slowly   Complete by:  As directed    Lifting restrictions   Complete by:  As directed    No lifting greater than 10 lbs.   Sexual Activity Restrictions   Complete by:  As directed    No sexual activity, nothing in the vagina, for 12 weeks.     Allergies as of 07/02/2018      Reactions  Hydrocodone Other (See Comments)   Hallucinations      Medication List    TAKE these medications   allopurinol 100 MG tablet Commonly known as:  ZYLOPRIM Take 100 mg by mouth daily.   Alpha-Lipoic Acid 200 MG Caps Take 200-400 mg by mouth See admin instructions. Take 200 mg by mouth in the morning and take 400 mg by mouth at bedtime   amLODipine 10 MG tablet Commonly known as:  NORVASC Take 10 mg by mouth daily.   carvedilol 6.25 MG tablet Commonly known as:  COREG Take 6.25 mg by mouth 2 (two) times daily with a meal.   citalopram 40 MG tablet Commonly known as:   CELEXA Take 0.5 tablets (20 mg total) by mouth at bedtime. Take 20 mg once daily What changed:    how much to take  additional instructions   hydrochlorothiazide 25 MG tablet Commonly known as:  HYDRODIURIL Take 25 mg by mouth daily.   lisinopril 20 MG tablet Commonly known as:  PRINIVIL,ZESTRIL Take 20 mg by mouth daily.   metFORMIN 500 MG tablet Commonly known as:  GLUCOPHAGE Take 1 tablet (500 mg total) by mouth 2 (two) times daily with a meal. Take one tablet (500 mg total) in the am and 500 mg in the pm until seen in the office for repeat creatinine level. What changed:    how much to take  when to take this  additional instructions   nitrofurantoin (macrocrystal-monohydrate) 100 MG capsule Commonly known as:  MACROBID Take 1 capsule (100 mg total) by mouth 2 (two) times daily.   nitroGLYCERIN 0.4 MG SL tablet Commonly known as:  NITROSTAT Place 0.4 mg under the tongue every 5 (five) minutes as needed for chest pain.   simvastatin 40 MG tablet Commonly known as:  ZOCOR Take 0.5 tablets (20 mg total) by mouth at bedtime. Until you see your primary care doctor What changed:    how much to take  additional instructions   traMADol 50 MG tablet Commonly known as:  ULTRAM Take 1 tablet (50 mg total) by mouth every 6 (six) hours as needed for severe pain.      Follow-up Information    [name], MD Follow up on 07/10/2018.   Specialty:  Gynecologic Oncology Why:  at 9:30am at the [facility] information: [place] [id number] [phone]           Greater than thirty minutes were spend for face to face discharge instructions and discharge orders/summary in EPIC.   Signed: Dorothyann Gibbs 07/02/2018, 3:37 PM   Patient seen and examined by me in morning and urine output documentation low overnight. Therefore giving more IVF and monitoring output over the morning. Drop in H/H therefore repeating that at noon. If both urine  output and repeat labs normal then patient to be discharged. Therefore agree with note above.

## 2018-07-02 NOTE — Progress Notes (Signed)
CRITICAL VALUE ALERT  Critical Value:  K 6.7  Date & Time Notied:  07/02/18 at [place]  Provider Notified: Dr Denman George  Orders Received/Actions taken: stat EKG,BMT & stop IVF  EKG result read back to MD

## 2018-07-02 NOTE — Progress Notes (Signed)
1 Day Post-Op Procedure(s) (LRB): XI ROBOTIC ASSISTED TOTAL HYSTERECTOMY WITH BILATERAL SALPINGO OOPHORECTOMY (Bilateral) SENTINEL NODE BIOPSY (N/A) POSSIBLE LAPAROTOMY WITH STAGING (N/A)  Subjective: Patient reports tolerating regular diet this am without nausea or emesis.  Ambulating without difficulty. Due to void since foley removal.  Minimal pain reported.  Denies chest pain, dyspnea. Husband and son at the bedside.  No concerns voiced.   Objective: Vital signs in last 24 hours: Temp:  [97.6 F (36.4 C)-98.9 F (37.2 C)] 98.1 F (36.7 C) (08/01 1322) Pulse Rate:  [86-105] 105 (08/01 1322) Resp:  [18-20] 18 (08/01 1322) BP: (110-144)/(64-80) 143/72 (08/01 1322) SpO2:  [94 %-100 %] 94 % (08/01 1322) Weight:  [224 lb 3.3 oz (101.7 kg)] 224 lb 3.3 oz (101.7 kg) (08/01 0529) Last BM Date: 07/01/18  Intake/Output from previous day: 07/31 0701 - 08/01 0700 In: 3913.7 [P.O.:880; I.V.:2833.7; IV Piggyback:200] Out: 1025 [Urine:975; Blood:50]  Physical Examination: General: alert, cooperative and no distress Resp: clear to auscultation bilaterally Cardio: regular rate and rhythm, S1, S2 normal, no murmur, click, rub or gallop GI: soft, non-tender; bowel sounds normal; no masses,  no organomegaly and incision: mild ecchymosis around upper abdominal incision Extremities: extremities normal, atraumatic, no cyanosis or edema  Labs: WBC/Hgb/Hct/Plts:  --/8.9/30.4/-- (08/01 1221) BUN/Cr/glu/ALT/AST/amyl/lip:  24/1.35/--/--/--/--/-- (08/01 3810)  Assessment: 72 y.o. s/p Procedure(s): XI ROBOTIC ASSISTED TOTAL HYSTERECTOMY WITH BILATERAL SALPINGO OOPHORECTOMY SENTINEL NODE BIOPSY POSSIBLE LAPAROTOMY WITH STAGING: stable Pain:  Pain is well-controlled on PRN medications.  Heme: Hgb 8.9 and Hct 30.4 this am.  Appropriate.  Repeat planned for later this am.  CV: BP and HR stable post-op.  Continue to monitor with ordered vital signs.  GI:  Tolerating po: Yes.  GU: IVF ordered per Dr.  Gerarda Fraction.  Creatinine 1.35 this am.    FEN: K+ 6.7 at 04:41 with recheck at 06:18am 5.3.    Endo: Diabetes mellitus Type II, under good control..  CBG:  CBG (last 3)  Recent Labs    07/02/18 0729 07/02/18 1149 07/02/18 1601  GLUCAP 138* 145* 140*   Prophylaxis: lovenox discontinued per Dr. Denman George due to creatinine.  Plan: Discontinue metformin, lovenox, lisinopril per Dr. Denman George Follow up on repeat H&H later this am Encourage ambulation, IS use, deep breathing, and coughing Monitor output closely Plan for possible discharge later today   LOS: 0 days    Dorothyann Gibbs 07/02/2018, 3:34 PM

## 2018-07-02 NOTE — Progress Notes (Signed)
Assessment unchanged. Pt verbalized understanding of dc instructions through teach back including follow up care, when to call the doctor, and medications to resume. Pt verbalized understanding of changes MD made to home medications Metformin, Zocor, and Celexa. No scripts at dc. Discharged via wc to front entrance accompanied by nurse and husband.

## 2018-07-02 NOTE — Discharge Instructions (Signed)
07/02/2018  Return to work: 4-6 weeks if applicable  Activity: 1. Be up and out of the bed during the day.  Take a nap if needed.  You may walk up steps but be careful and use the hand rail.  Stair climbing will tire you more than you think, you may need to stop part way and rest.   2. No lifting or straining for 6 weeks.  3. No driving for 2 week(s).  Do not drive if you are taking narcotic pain medicine.  4. Shower daily.  Use soap and water on your incision and pat dry; don't rub.  No tub baths until cleared by your surgeon.   5. No sexual activity and nothing in the vagina for 12 weeks.  6. You may experience a small amount of clear drainage from your incisions, which is normal.  If the drainage persists or increases, please call the office.  7. You may experience vaginal spotting after surgery or around the 6-8 week mark from surgery when the stitches at the top of the vagina begin to dissolve.  The spotting is normal but if you experience heavy bleeding, call our office.  8. Take Tylenol first for pain and only use tramadol for severe pain not relieved by the Tylenol or Ibuprofen.  Monitor your Tylenol intake to a max of 4,000 mg a day since tramadol has Tylenol in it as well.  Diet: 1. Low sodium Heart Healthy Diet is recommended.  2. It is safe to use a laxative, such as Miralax or Colace, if you have difficulty moving your bowels. You can take Sennakot at bedtime every evening to keep bowel movements regular and to prevent constipation.    Wound Care: 1. Keep clean and dry.  Shower daily.  Reasons to call the Doctor:  Fever - Oral temperature greater than 100.4 degrees Fahrenheit  Foul-smelling vaginal discharge  Difficulty urinating  Nausea and vomiting  Increased pain at the site of the incision that is unrelieved with pain medicine.  Difficulty breathing with or without chest pain  New calf pain especially if only on one side  Sudden, continuing increased  vaginal bleeding with or without clots.   Contacts: For questions or concerns you should contact:  Dr. Everitt Amber at [phone]  Joylene John, NP at [phone]  After Hours: call [phone] and have the GYN Oncologist paged/contacted  Tramadol tablets What is this medicine? TRAMADOL (TRA ma dole) is a pain reliever. It is used to treat moderate to severe pain in adults. This medicine may be used for other purposes; ask your health care provider or pharmacist if you have questions. COMMON BRAND NAME(S): Ultram What should I tell my health care provider before I take this medicine? They need to know if you have any of these conditions: -brain tumor -depression -drug abuse or addiction -head injury -if you frequently drink alcohol containing drinks -kidney disease or trouble passing urine -liver disease -lung disease, asthma, or breathing problems -seizures or epilepsy -suicidal thoughts, plans, or attempt; a previous suicide attempt by you or a family member -an unusual or allergic reaction to tramadol, codeine, other medicines, foods, dyes, or preservatives -pregnant or trying to get pregnant -breast-feeding How should I use this medicine? Take this medicine by mouth with a full glass of water. Follow the directions on the prescription label. You can take it with or without food. If it upsets your stomach, take it with food. Do not take your medicine more often than directed. A  special MedGuide will be given to you by the pharmacist with each prescription and refill. Be sure to read this information carefully each time. Talk to your pediatrician regarding the use of this medicine in children. Special care may be needed. Overdosage: If you think you have taken too much of this medicine contact a poison control center or emergency room at once. NOTE: This medicine is only for you. Do not share this medicine with others. What if I miss a dose? If you miss a dose, take it as  soon as you can. If it is almost time for your next dose, take only that dose. Do not take double or extra doses. What may interact with this medicine? Do not take this medication with any of the following medicines: -MAOIs like Carbex, Eldepryl, Marplan, Nardil, and Parnate This medicine may also interact with the following medications: -alcohol -antihistamines for allergy, cough and cold -certain medicines for anxiety or sleep -certain medicines for depression like amitriptyline, fluoxetine, sertraline -certain medicines for migraine headache like almotriptan, eletriptan, frovatriptan, naratriptan, rizatriptan, sumatriptan, zolmitriptan -certain medicines for seizures like carbamazepine, oxcarbazepine, phenobarbital, primidone -certain medicines that treat or prevent blood clots like warfarin -digoxin -furazolidone -general anesthetics like halothane, isoflurane, methoxyflurane, propofol -linezolid -local anesthetics like lidocaine, pramoxine, tetracaine -medicines that relax muscles for surgery -other narcotic medicines for pain or cough -phenothiazines like chlorpromazine, mesoridazine, prochlorperazine, thioridazine -procarbazine This list may not describe all possible interactions. Give your health care provider a list of all the medicines, herbs, non-prescription drugs, or dietary supplements you use. Also tell them if you smoke, drink alcohol, or use illegal drugs. Some items may interact with your medicine. What should I watch for while using this medicine? Tell your doctor or health care professional if your pain does not go away, if it gets worse, or if you have new or a different type of pain. You may develop tolerance to the medicine. Tolerance means that you will need a higher dose of the medicine for pain relief. Tolerance is normal and is expected if you take this medicine for a long time. Do not suddenly stop taking your medicine because you may develop a severe reaction.  Your body becomes used to the medicine. This does NOT mean you are addicted. Addiction is a behavior related to getting and using a drug for a non-medical reason. If you have pain, you have a medical reason to take pain medicine. Your doctor will tell you how much medicine to take. If your doctor wants you to stop the medicine, the dose will be slowly lowered over time to avoid any side effects. There are different types of narcotic medicines (opiates). If you take more than one type at the same time or if you are taking another medicine that also causes drowsiness, you may have more side effects. Give your health care provider a list of all medicines you use. Your doctor will tell you how much medicine to take. Do not take more medicine than directed. Call emergency for help if you have problems breathing or unusual sleepiness. You may get drowsy or dizzy. Do not drive, use machinery, or do anything that needs mental alertness until you know how this medicine affects you. Do not stand or sit up quickly, especially if you are an older patient. This reduces the risk of dizzy or fainting spells. Alcohol can increase or decrease the effects of this medicine. Avoid alcoholic drinks. You may have constipation. Try to have a bowel movement at least  every 2 to 3 days. If you do not have a bowel movement for 3 days, call your doctor or health care professional. Your mouth may get dry. Chewing sugarless gum or sucking hard candy, and drinking plenty of water may help. Contact your doctor if the problem does not go away or is severe. What side effects may I notice from receiving this medicine? Side effects that you should report to your doctor or health care professional as soon as possible: -allergic reactions like skin rash, itching or hives, swelling of the face, lips, or tongue -breathing problems -confusion -seizures -signs and symptoms of low blood pressure like dizziness; feeling faint or lightheaded, falls;  unusually weak or tired -trouble passing urine or change in the amount of urine Side effects that usually do not require medical attention (report to your doctor or health care professional if they continue or are bothersome): -constipation -dry mouth -nausea, vomiting -tiredness This list may not describe all possible side effects. Call your doctor for medical advice about side effects. You may report side effects to FDA at 1-800-FDA-1088. Where should I keep my medicine? Keep out of the reach of children. This medicine may cause accidental overdose and death if it taken by other adults, children, or pets. Mix any unused medicine with a substance like cat litter or coffee grounds. Then throw the medicine away in a sealed container like a sealed bag or a coffee can with a lid. Do not use the medicine after the expiration date. Store at room temperature between 15 and 30 degrees C (59 and 86 degrees F). NOTE: This sheet is a summary. It may not cover all possible information. If you have questions about this medicine, talk to your doctor, pharmacist, or health care provider.  2018 [employer] Standard (2015-08-13 09:00:04)

## 2018-07-08 ENCOUNTER — Other Ambulatory Visit: Payer: Self-pay | Admitting: Gynecologic Oncology

## 2018-07-08 ENCOUNTER — Telehealth: Payer: Self-pay | Admitting: *Deleted

## 2018-07-08 DIAGNOSIS — N6320 Unspecified lump in the left breast, unspecified quadrant: Secondary | ICD-10-CM

## 2018-07-08 NOTE — Progress Notes (Signed)
Received mammo from [facility].  Left breast mass needed to be biopsied.

## 2018-07-08 NOTE — Telephone Encounter (Signed)
Per request from [facility], faxed order to [facility] for a left breast biopsy

## 2018-07-10 ENCOUNTER — Inpatient Hospital Stay: Payer: Medicare HMO | Attending: Obstetrics | Admitting: Obstetrics

## 2018-07-10 ENCOUNTER — Inpatient Hospital Stay: Payer: Medicare HMO

## 2018-07-10 ENCOUNTER — Encounter: Payer: Self-pay | Admitting: Obstetrics

## 2018-07-10 VITALS — BP 108/54 | HR 91 | Temp 98.0°F | Resp 20 | Ht 65.0 in | Wt 212.1 lb

## 2018-07-10 DIAGNOSIS — R7989 Other specified abnormal findings of blood chemistry: Secondary | ICD-10-CM

## 2018-07-10 DIAGNOSIS — Z90710 Acquired absence of both cervix and uterus: Secondary | ICD-10-CM | POA: Insufficient documentation

## 2018-07-10 DIAGNOSIS — N6320 Unspecified lump in the left breast, unspecified quadrant: Secondary | ICD-10-CM | POA: Diagnosis not present

## 2018-07-10 DIAGNOSIS — Z90722 Acquired absence of ovaries, bilateral: Secondary | ICD-10-CM | POA: Insufficient documentation

## 2018-07-10 DIAGNOSIS — C541 Malignant neoplasm of endometrium: Secondary | ICD-10-CM

## 2018-07-10 DIAGNOSIS — C561 Malignant neoplasm of right ovary: Secondary | ICD-10-CM | POA: Diagnosis not present

## 2018-07-10 LAB — COMPREHENSIVE METABOLIC PANEL
ALT: 15 U/L (ref 0–44)
AST: 14 U/L — AB (ref 15–41)
Albumin: 2.9 g/dL — ABNORMAL LOW (ref 3.5–5.0)
Alkaline Phosphatase: 100 U/L (ref 38–126)
Anion gap: 13 (ref 5–15)
BUN: 20 mg/dL (ref 8–23)
CHLORIDE: 101 mmol/L (ref 98–111)
CO2: 21 mmol/L — ABNORMAL LOW (ref 22–32)
Calcium: 9 mg/dL (ref 8.9–10.3)
Creatinine, Ser: 1.26 mg/dL — ABNORMAL HIGH (ref 0.44–1.00)
GFR calc non Af Amer: 42 mL/min — ABNORMAL LOW (ref >60)
GFR, EST AFRICAN AMERICAN: 48 mL/min — AB (ref >60)
Glucose, Bld: 237 mg/dL — ABNORMAL HIGH (ref 70–99)
POTASSIUM: 4.4 mmol/L (ref 3.5–5.1)
SODIUM: 135 mmol/L (ref 135–145)
Total Bilirubin: 0.2 mg/dL — ABNORMAL LOW (ref 0.3–1.2)
Total Protein: 6.6 g/dL (ref 6.5–8.1)

## 2018-07-10 NOTE — Progress Notes (Signed)
[place] at [facility]   Progress Note: Established Patient Follow-up Visit   Consult was originally requested by Dr. Ashby Dawes  Chief Complaint  Patient presents with  . Endometrial cancer [facility])    HPI: Andrea Hoffman  is a very nice 72 y.o.  P4  . Interval History Since her last visit she underwent on 07/01/18 Robotic-assisted TLH/BSO pelvic lymphadenectomy and SLN biopsy, Pelvic washings, Omentectomy (partial) Findings included ; Adhesions of omentum to anterior abdominal wall. Normal sized uterus. Normal appearing left ovary. Right ovary enlarged and firm; bilobed. Frozen on uterus >50% depth of invasion. 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 Sentinel uptake left and right external iliac vessels, both proximal and distal, so 4 total sentinel lymph nodes. Mild adenopathy in right proximal external node. No gross extrapelvic disease  There were no perioperative complications.   Final pathology revealed: Uterus, cervix and bilateral fallopian tubes, left ovary - ENDOMETRIOID ADENOCARCINOMA, 4.1 CM, FIGO GRADE 1. - CARCINOMA INVADES FOR A DEPTH OF 1.3 CM WHERE MYOMETRIAL THICKNESS IS 2.0 CM (GREATER THAN 50%). - NEGATIVE FOR LYMPHOVASCULAR OR PERINEURAL INVASION. - BENIGN LEIOMYOMATA. - BENIGN CERVICAL POLYP. - BENIGN UNREMARKABLE BILATERAL FALLOPIAN TUBES AND LEFT OVARY. - SEE ONCOLOGY TABLE. - SEE NOTE. 2. 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 - OVARIAN SURFACE IS NOT INVOLVED BY TUMOR. - NO SIGNIFICANT MITOTIC ACTIVITY OR NECROSIS. - SEE ONCOLOGY TABLE. - SEE NOTE. 3. Lymph node, sentinel, biopsy, right external iliac - LYMPH NODE, NEGATIVE FOR CARCINOMA (0/1). - IMMUNOHISTOCHEMICAL STAIN FOR PANKERATIN (AE1/AE3) IS NEGATIVE 4. Lymph node, sentinel, biopsy, right distal external iliac - LYMPH  NODE, NEGATIVE FOR CARCINOMA (0/1). - IMMUNOHISTOCHEMICAL STAIN FOR PANKERATIN (AE1/AE3) IS NEGATIVE 5. Lymph node, sentinel, biopsy, left external iliac - LYMPH NODE, NEGATIVE FOR CARCINOMA (0/1). - IMMUNOHISTOCHEMICAL STAIN FOR PANKERATIN (AE1/AE3) IS NEGATIVE. 6. Lymph node, sentinel, biopsy, left distal external iliac - LYMPH NODE, NEGATIVE FOR CARCINOMA (0/1). - IMMUNOHISTOCHEMICAL STAIN FOR PANKERATIN (AE1/AE3) IS NEGATIVE 7. Lymph nodes, regional resection, left pelvic - FIVE LYMPH NODES, NEGATIVE FOR CARCINOMA (0/5). 8. Lymph nodes, regional resection, right pelvic - THREE LYMPH NODES, NEGATIVE FOR TUMOR (0/3). 9. Omentum, resection for tumor - PORTION OF OMENTUM, NEGATIVE FOR TUMOR. Microscopic Comment 1. UTERUS, CARCINOMA OR CARCINOSARCOMA Procedure: Total hysterectomy with bilateral salpingo-oophorectomy. Histologic type: Endometrioid adenocarcinoma. Histologic Grade: FIGO grade 1. Myometrial invasion: Present. Depth of invasion: 13 mm. Myometrial thickness: 20 mm. Uterine Serosa Involvement: Not identified. Cervical stromal involvement: Not identified. Extent of involvement of other organs: Not applicable. Lymphovascular invasion: Not identified. 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  HOWEVER preop chest CT showed breast lesion and workup is in progress by [place]. We discussed those findings today.  She is doing well. She is having normal bowel and bladder function. Denies fever, N/V, and pain controlled.  . Presenting History (Oncologic Course if applicable)  She noted postmenopausal bleeding described as spotting a few months prior to presentation. Noted some cramps, otherwise no significant symptoms. She called her PCP to report the bleeding and then was  referred to Dr. Octaviano Glow for work-up. A Pap smear was done revealing AGUS with HRHPV negative. A  cervical polyp was removed and found to be benign.  Followup TVUS showing thickened EM lining and bilateral adnexal masses. Endometrial biopsy  (read by [facility]) revealed grade 1 endometrial adenocarcinoma.   CA125 elevated for the lab at 30.7 (lab normal <21) CEA mildly elevated at 3.3 (lab normal <3 for nonsmokers)  Denies nausea, vomiting, change in appetite. Has lost about 7 pounds in 6 months. Some pelvic cramping.  Has history of colon cancer operated on in [facility]. States had normal colonoscopy 2018.    Imported EPIC Oncologic History:    No history exists.    Measurement of disease:  TBD .   Radiology:  05/19/18 - TVUS -  8.7 x 5 x 4cm uterus , with 2 cm Em lining avascular. 2 complex masses in CDS 5.5x5x6cm and 6x6.3x6.4 cm vascular (ovaries versus other). Ct Chest W Contrast  Result Date: 06/20/2018 CLINICAL DATA:  Staging endometrial cancer. Scheduled for hysterectomy 07/01/2018. Prior history of colon cancer with partial colon resection. EXAM: CT CHEST, ABDOMEN, AND PELVIS WITH CONTRAST TECHNIQUE: Multidetector CT imaging of the chest, abdomen and pelvis was performed following the standard protocol during bolus administration of intravenous contrast. CONTRAST:  97mL ISOVUE-300 IOPAMIDOL (ISOVUE-300) INJECTION 61% COMPARISON:  Chest CT from 2012 and abdominal CT scan from 2014. FINDINGS: CT CHEST FINDINGS Cardiovascular: The heart is normal in size. No pericardial effusion. Scattered atherosclerotic calcifications involving the thoracic aorta but no aneurysm or dissection. The branch vessels are patent. Three-vessel coronary artery calcifications are noted. Mitral valve annular calcifications are also noted. Mediastinum/Nodes: No mediastinal or hilar mass or lymphadenopathy. Small scattered lymph nodes are likely benign. The esophagus is grossly normal. Lungs/Pleura:  Several small, sub 5 mm pulmonary nodules. The prior CT scan of the chest shows significant airspace disease but at least some of these pulmonary nodules were present on that study and also noted in the lung bases on the prior CT scan from 2014. These are most likely benign. No acute pulmonary findings. No pleural effusion. Underlying emphysematous changes. Musculoskeletal/chest wall: Enhancing appearing nodular lesion in the left medial breast on image number 28 measures 18 mm. Could not exclude a breast cancer. Recommend correlation with physical examination, diagnostic mammography and ultrasound. Multinodular thyroid goiter. Right-sided Port-A-Cath in good position without complicating features. No significant bony findings. CT ABDOMEN PELVIS FINDINGS Hepatobiliary: No focal hepatic lesions or intrahepatic biliary dilatation. Gallstones are noted the gallbladder. No common bile duct dilatation. Pancreas: No mass, inflammation or ductal dilatation. Spleen: Normal size.  No focal lesions. Adrenals/Urinary Tract: The adrenal glands and kidneys are unremarkable. No renal, ureteral or bladder calculi or mass. Stomach/Bowel: The stomach, duodenum, small bowel and colon are unremarkable. No acute inflammatory changes, mass lesions or obstructive findings. Status post right hemicolectomy. The ileocolonic anastomosis appears normal. Vascular/Lymphatic: The aorta is normal in caliber. No dissection. The branch vessels are patent. The major venous structures are patent. No mesenteric or retroperitoneal mass or adenopathy. Small scattered lymph nodes are noted. Reproductive: The uterus demonstrates a thickened and irregular endometrium with maximum measurement of 26 mm and consistent with patient's known endometrial cancer. Do not see any obvious extension beyond the myometrium or into the cervix. There is a large right adnexal mass measuring 10.8 x 6.9 cm. It is closely associated with the posterior aspect of the uterus and  could be a large lobulated fibroid. It is hard to separate from the right ovary and could be a solid ovarian neoplasm. The left ovary is normal. MRI may be  helpful for further evaluation. Other: Small bilateral external iliac/operator lymph nodes are significantly smaller when compared to prior study from 2014 and likely benign. The right measures 8 mm and the left measures 7.5 mm. No sigmoid mesocolon or retroperitoneal adenopathy. No inguinal adenopathy. Periumbilical abdominal wall hernia containing fat. Musculoskeletal: No significant bony findings. Scoliosis and degenerative lumbar spondylosis. Moderate bilateral hip joint degenerative changes, left greater than right. IMPRESSION: 1. Thickened irregular endometrium consistent with patient's known endometrial cancer. I do not see any obvious extension beyond myometrium. 2. Large right adnexal mass could represent a solid ovarian neoplasm or a large exophytic fibroid. MRI may be helpful for further evaluation. The left ovary is normal. 3. Enhancing 17 mm left medial breast mass. Recommend correlation with physical exam, diagnostic mammography and ultrasound. 4. Numerous small pulmonary nodules are likely benign as above. 5. Small pelvic lymph nodes are likely benign. 6. Status post right hemicolectomy. 7. Cholelithiasis. Electronically Signed   By: Marijo Sanes M.D.   On: 06/20/2018 16:09   Ct Abdomen Pelvis W Contrast  Result Date: 06/20/2018 CLINICAL DATA:  Staging endometrial cancer. Scheduled for hysterectomy 07/01/2018. Prior history of colon cancer with partial colon resection. EXAM: CT CHEST, ABDOMEN, AND PELVIS WITH CONTRAST TECHNIQUE: Multidetector CT imaging of the chest, abdomen and pelvis was performed following the standard protocol during bolus administration of intravenous contrast. CONTRAST:  61mL ISOVUE-300 IOPAMIDOL (ISOVUE-300) INJECTION 61% COMPARISON:  Chest CT from 2012 and abdominal CT scan from 2014. FINDINGS: CT CHEST FINDINGS  Cardiovascular: The heart is normal in size. No pericardial effusion. Scattered atherosclerotic calcifications involving the thoracic aorta but no aneurysm or dissection. The branch vessels are patent. Three-vessel coronary artery calcifications are noted. Mitral valve annular calcifications are also noted. Mediastinum/Nodes: No mediastinal or hilar mass or lymphadenopathy. Small scattered lymph nodes are likely benign. The esophagus is grossly normal. Lungs/Pleura: Several small, sub 5 mm pulmonary nodules. The prior CT scan of the chest shows significant airspace disease but at least some of these pulmonary nodules were present on that study and also noted in the lung bases on the prior CT scan from 2014. These are most likely benign. No acute pulmonary findings. No pleural effusion. Underlying emphysematous changes. Musculoskeletal/chest wall: Enhancing appearing nodular lesion in the left medial breast on image number 28 measures 18 mm. Could not exclude a breast cancer. Recommend correlation with physical examination, diagnostic mammography and ultrasound. Multinodular thyroid goiter. Right-sided Port-A-Cath in good position without complicating features. No significant bony findings. CT ABDOMEN PELVIS FINDINGS Hepatobiliary: No focal hepatic lesions or intrahepatic biliary dilatation. Gallstones are noted the gallbladder. No common bile duct dilatation. Pancreas: No mass, inflammation or ductal dilatation. Spleen: Normal size.  No focal lesions. Adrenals/Urinary Tract: The adrenal glands and kidneys are unremarkable. No renal, ureteral or bladder calculi or mass. Stomach/Bowel: The stomach, duodenum, small bowel and colon are unremarkable. No acute inflammatory changes, mass lesions or obstructive findings. Status post right hemicolectomy. The ileocolonic anastomosis appears normal. Vascular/Lymphatic: The aorta is normal in caliber. No dissection. The branch vessels are patent. The major venous structures are  patent. No mesenteric or retroperitoneal mass or adenopathy. Small scattered lymph nodes are noted. Reproductive: The uterus demonstrates a thickened and irregular endometrium with maximum measurement of 26 mm and consistent with patient's known endometrial cancer. Do not see any obvious extension beyond the myometrium or into the cervix. There is a large right adnexal mass measuring 10.8 x 6.9 cm. It is closely associated with the posterior aspect of  the uterus and could be a large lobulated fibroid. It is hard to separate from the right ovary and could be a solid ovarian neoplasm. The left ovary is normal. MRI may be helpful for further evaluation. Other: Small bilateral external iliac/operator lymph nodes are significantly smaller when compared to prior study from 2014 and likely benign. The right measures 8 mm and the left measures 7.5 mm. No sigmoid mesocolon or retroperitoneal adenopathy. No inguinal adenopathy. Periumbilical abdominal wall hernia containing fat. Musculoskeletal: No significant bony findings. Scoliosis and degenerative lumbar spondylosis. Moderate bilateral hip joint degenerative changes, left greater than right. IMPRESSION: 1. Thickened irregular endometrium consistent with patient's known endometrial cancer. I do not see any obvious extension beyond myometrium. 2. Large right adnexal mass could represent a solid ovarian neoplasm or a large exophytic fibroid. MRI may be helpful for further evaluation. The left ovary is normal. 3. Enhancing 17 mm left medial breast mass. Recommend correlation with physical exam, diagnostic mammography and ultrasound. 4. Numerous small pulmonary nodules are likely benign as above. 5. Small pelvic lymph nodes are likely benign. 6. Status post right hemicolectomy. 7. Cholelithiasis. Electronically Signed   By: Marijo Sanes M.D.   On: 06/20/2018 16:09      Current Meds:  Outpatient Encounter Medications as of 07/10/2018  Medication Sig  . allopurinol  (ZYLOPRIM) 100 MG tablet Take 100 mg by mouth daily.  . Alpha-Lipoic Acid 200 MG CAPS Take 200-400 mg by mouth See admin instructions. Take 200 mg by mouth in the morning and take 400 mg by mouth at bedtime  . amLODipine (NORVASC) 10 MG tablet Take 10 mg by mouth daily.   . carvedilol (COREG) 6.25 MG tablet Take 6.25 mg by mouth 2 (two) times daily with a meal.   . citalopram (CELEXA) 40 MG tablet Take 0.5 tablets (20 mg total) by mouth at bedtime. Take 20 mg once daily  . hydrochlorothiazide (HYDRODIURIL) 25 MG tablet Take 25 mg by mouth daily.   Marland Kitchen lisinopril (PRINIVIL,ZESTRIL) 20 MG tablet Take 20 mg by mouth daily.   . metFORMIN (GLUCOPHAGE) 500 MG tablet Take 1 tablet (500 mg total) by mouth 2 (two) times daily with a meal. Take one tablet (500 mg total) in the am and 500 mg in the pm until seen in the office for repeat creatinine level.  . nitroGLYCERIN (NITROSTAT) 0.4 MG SL tablet Place 0.4 mg under the tongue every 5 (five) minutes as needed for chest pain.   . simvastatin (ZOCOR) 40 MG tablet Take 0.5 tablets (20 mg total) by mouth at bedtime. Until you see your primary care doctor  . traMADol (ULTRAM) 50 MG tablet Take 1 tablet (50 mg total) by mouth every 6 (six) hours as needed for severe pain. (Patient not taking: Reported on 07/10/2018)  . [DISCONTINUED] nitrofurantoin, macrocrystal-monohydrate, (MACROBID) 100 MG capsule Take 1 capsule (100 mg total) by mouth 2 (two) times daily. (Patient not taking: Reported on 07/10/2018)   No facility-administered encounter medications on file as of 07/10/2018.     Allergy:  Allergies  Allergen Reactions  . Hydrocodone Other (See Comments)    Hallucinations    Past Surgical Hx:  Past Surgical History:  Procedure Laterality Date  . AMPUTATION TOE  2014   2 toes amputated  . COLONOSCOPY    . LAPAROTOMY WITH STAGING N/A 07/01/2018   Procedure: POSSIBLE LAPAROTOMY WITH STAGING;  Surgeon: [name], MD;  Location: [facility];  Service:  Gynecology;  Laterality: N/A;  . PARTIAL COLECTOMY  2002 or 2003   Colon cancer [facility] - "took 18-22 inches"  . ROBOTIC ASSISTED TOTAL HYSTERECTOMY WITH BILATERAL SALPINGO OOPHERECTOMY Bilateral 07/01/2018   Procedure: XI ROBOTIC ASSISTED TOTAL HYSTERECTOMY WITH BILATERAL SALPINGO OOPHORECTOMY;  Surgeon: [name], MD;  Location: [facility];  Service: Gynecology;  Laterality: Bilateral;  . SENTINEL NODE BIOPSY N/A 07/01/2018   Procedure: SENTINEL NODE BIOPSY;  Surgeon: [name], MD;  Location: [facility];  Service: Gynecology;  Laterality: N/A;  . TONSILLECTOMY     Patient had her tonsils taken out at age 59  . TUBAL LIGATION  1974  . UPPER GI ENDOSCOPY      Past Medical Hx:  Past Medical History:  Diagnosis Date  . Anemia   . Broken arm    Right shoulder  . Bunion   . Colon cancer ([place])    surgery followed by chemo for "6 months"  . Cyanocobalamin deficiency   . Depression   . Diabetes mellitus without complication ([place])   . Diabetic neuropathy ([place])   . Dyspnea    with exertion  . Endometrial cancer ([place])   . Hammer toe   . High cholesterol   . History of pneumonia   . Hypertension   . Migraines    history of  . Pulmonary nodules    Numerous small  . Rheumatic fever    age 53    Past Gynecological History:   GYNECOLOGIC HISTORY:  No LMP recorded. Patient is postmenopausal. Menarche: 72 years old P 4 LMP 20 years ago, then again with referral diagnosis Contraceptive never HRT 1 year  Last Pap  Referral Pap 05/2018 AGUS with negative HRHPV  Family Hx:  Family History  Problem Relation Age of Onset  . Heart attack Father   . Diabetes Brother   . Diabetes Maternal Grandmother   . Lung cancer Mother   . Breast cancer Mother        ? bony mets  . Breast cancer Maternal Aunt   . Colon cancer Maternal Uncle   . Breast cancer Maternal Aunt   . Cancer Cousin        unknown type in their 38's  . Cancer Cousin        Lung in their 3's  . Colon  cancer Maternal Uncle     Social Hx:  Tobacco use: never Alcohol use: never Illicit Drug use: denies Illicit IV Drug use: denies  Review of Systems:  Review of Systems  Constitutional: Negative.   HENT:  Negative.   Eyes: Negative.   Respiratory: Negative.   Cardiovascular: Negative.   Gastrointestinal: Negative.   Endocrine: Negative.   Genitourinary: Negative.    Musculoskeletal: Negative.   Skin: Negative.   Neurological: Negative.   Hematological: Negative.   Psychiatric/Behavioral: Negative.      Vitals:  Vitals:   07/10/18 0931  BP: (!) 108/54  Pulse: 91  Resp: 20  Temp: 98 F (36.7 C)  SpO2: 99%   Body mass index is 35.3 kg/m.   Physical Exam:  General :  Obese. Well developed, 72 y.o., female in no apparent distress HEENT:  Normocephalic/atraumatic, symmetric, EOMI, eyelids normal Neck:   No visible masses.  Respiratory:  Respirations unlabored, no use of accessory muscles CV:   Deferred Breast:  Deferred Musculoskeletal: Normal muscle strength. Abdomen:  Wounds are CDI No visible masses or protrusion Extremities:  No visible edema or deformities Skin:   Normal inspection Neuro/Psych:  No focal motor deficit, no abnormal  mental status. Normal gait. Normal affect. 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 Path was reviewed and she was given a copy of that and the operative note 2. Stage IA Granulosa Cell Tumor o No further treatment pending tumor board 3. Stage IA Endometrial Cancer o >50% DOI o Will refer to radiation for brachy consideration to decrease her risk of local recurrence o Followup washings at tumor board. 4. Activity restrictions 5. RTC one month for vaginal cuff check 6. Breast lesion o We have obtained and I reviewed a report from her last MMG 10/2016  Which was normal, however we  did order a MMG given the CT findings and that was done 07/2018 with a 2cm lesion in the left breast with blood flow that biopsy is recommended so we have her scheduled for that this week. 7. Genetics to be kept in planning given personal and family h/o colon cancer  Face to face time with patient was 15 minutes. Over 50% of this time was spent on counseling and coordination of care. This included discussion about prognosis, therapy recommendations including the breast biopsy, radiation for the vaginal cuff that are beyond the scope of routine postoperative care.   [name], MD  07/10/2018, 5:26 PM    Cc: Ashby Dawes, DO (Referring Ob/Gyn) Cecille Amsterdam, DO (PCP)

## 2018-07-10 NOTE — Patient Instructions (Signed)
1. Return in one month for a vaginal check 2. No strenuous activity until return but continue to walk several times daily 3. We will refer you for a radiation appointment 4. Keep your breast biopsy appt

## 2018-07-16 NOTE — Progress Notes (Signed)
GYN Location of Tumor / Histology: Endometrial Cancer  Andrea Hoffman presented with symptoms of: Postmenopausal bleeding described as spotting a few months prior to presentation. Noted some cramps, otherwise no significant symptoms. She called her PCP to report the bleeding and then was referred to Dr. Octaviano Glow for work-up. A Pap smear was done revealing AGUS with HRHPV negative. A cervical polyp was removed and found to be benign  Biopsies revealed:  07/01/2018 Uterus, cervix and bilateral fallopian tubes, left ovary - ENDOMETRIOID ADENOCARCINOMA, 4.1 CM, FIGO GRADE 1. - CARCINOMA INVADES FOR A DEPTH OF 1.3 CM WHERE MYOMETRIAL THICKNESS IS 2.0 CM (GREATER THAN 50%). - NEGATIVE FOR LYMPHOVASCULAR OR PERINEURAL INVASION. - BENIGN LEIOMYOMATA. - BENIGN CERVICAL POLYP. - BENIGN UNREMARKABLE BILATERAL FALLOPIAN TUBES AND LEFT OVARY. - SEE ONCOLOGY TABLE. - SEE NOTE. 2. 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 - OVARIAN SURFACE IS NOT INVOLVED BY TUMOR. - NO SIGNIFICANT MITOTIC ACTIVITY OR NECROSIS. - SEE ONCOLOGY TABLE. - SEE NOTE. 3. Lymph node, sentinel, biopsy, right external iliac - LYMPH NODE, NEGATIVE FOR CARCINOMA (0/1). - IMMUNOHISTOCHEMICAL STAIN FOR PANKERATIN (AE1/AE3) IS NEGATIVE 4. Lymph node, sentinel, biopsy, right distal external iliac - LYMPH NODE, NEGATIVE FOR CARCINOMA (0/1). - IMMUNOHISTOCHEMICAL STAIN FOR PANKERATIN (AE1/AE3) IS NEGATIVE 5. Lymph node, sentinel, biopsy, left external iliac - LYMPH NODE, NEGATIVE FOR CARCINOMA (0/1). - IMMUNOHISTOCHEMICAL STAIN FOR PANKERATIN (AE1/AE3) IS NEGATIVE. 6. Lymph node, sentinel, biopsy, left distal external iliac - LYMPH NODE, NEGATIVE FOR CARCINOMA (0/1). - IMMUNOHISTOCHEMICAL STAIN FOR PANKERATIN (AE1/AE3) IS NEGATIVE 7. Lymph nodes, regional resection, left pelvic - FIVE LYMPH NODES, NEGATIVE FOR CARCINOMA (0/5). 8. Lymph nodes,  regional resection, right pelvic - THREE LYMPH NODES, NEGATIVE FOR TUMOR (0/3). 9. Omentum, resection for tumor - PORTION OF OMENTUM, NEGATIVE FOR TUMOR. Microscopic Comment 1. UTERUS, CARCINOMA OR CARCINOSARCOMA Procedure: Total hysterectomy with bilateral salpingo-oophorectomy. Histologic type: Endometrioid adenocarcinoma. Histologic Grade: FIGO grade 1. Myometrial invasion: Present. Depth of invasion: 13 mm. Myometrial thickness: 20 mm. Uterine Serosa Involvement: Not identified. Cervical stromal involvement: Not identified. Extent of involvement of other organs: Not applicable. Lymphovascular invasion: Not identified. Washings atypical but not diagnostic of malignancy Preop Chest CT Breast lesion, but no pulmonary nodules  Past/Anticipated interventions by Gyn/Onc surgery, if any:  07/01/2018 Surgeon: Dr. Precious Haws 1. Robotic-assisted laparoscopic total hysterectomy with bilateral salpingoophorectomy 2. Pelvic lymphadenectomy and SLN biopsy 3. Pelvic washings 4. Omentectomy (partial)  Past/Anticipated interventions by medical oncology, if any: None at this time  Weight changes, if any: No  Bowel/Bladder complaints, if any: No dysuria/hematuria. Pt denies rectal bleeding. Pt conveys she has a tendency to be constipated.  Nausea/Vomiting, if any: No  Pain issues, if any:  No  SAFETY ISSUES:  Prior radiation? No  Pacemaker/ICD? No  Possible current pregnancy? N/A; S/P total hysterectomy  Is the patient on methotrexate? No  Current Complaints / other details:  1. Breast lesion ? We have obtained and I reviewed a report from her last MMG 10/2016  Which was normal, however we did order a MMG given the CT findings and that was done 07/2018 with a 2cm lesion in the left breast with blood flow that biopsy is recommended so we have her scheduled for that this week. 2. Genetics to be kept in planning given personal and family h/o colon cancer  Pt presents today for  initial consult with Dr. Sondra Come for Radiation Oncology. Pt is accompanied by  husband. Pt is anxious.   Loma Sousa, RN BSN

## 2018-07-27 ENCOUNTER — Encounter: Payer: Self-pay | Admitting: Oncology

## 2018-07-27 DIAGNOSIS — C541 Malignant neoplasm of endometrium: Secondary | ICD-10-CM

## 2018-07-27 NOTE — Progress Notes (Addendum)
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  Disposition is to vaginal bracytherapy.  Recommend referral to genetics. Breast biopsy completed at [facility] and was negative.  This Multidisciplinary conference took place involving physicians from [place], [place], Radiation Oncology, Pathology, Radiology along with the Gynecologic Oncology Nurse Practitioner and RN.  Comprehensive assessment of the patient's malignancy, staging, need for surgery, chemotherapy, radiation therapy, and need for further testing were reviewed. Supportive measures, both inpatient and following discharge were also discussed. The recommended plan of care is documented. Greater than 35 minutes were spent correlating and coordinating this patient's care.

## 2018-07-27 NOTE — Addendum Note (Signed)
Addended by: Elmo Putt R on: 07/27/2018 11:42 AM   Modules accepted: Orders

## 2018-07-29 ENCOUNTER — Encounter: Payer: Self-pay | Admitting: Licensed Clinical Social Worker

## 2018-07-29 ENCOUNTER — Telehealth: Payer: Self-pay | Admitting: Licensed Clinical Social Worker

## 2018-07-29 NOTE — Telephone Encounter (Signed)
A genetic counseling appt has ben scheduled for the pt to see Epimenio Foot on 9/26 at 11am. Letter mailed to the pt.

## 2018-07-30 ENCOUNTER — Encounter: Payer: Self-pay | Admitting: Radiation Oncology

## 2018-07-30 ENCOUNTER — Encounter (INDEPENDENT_AMBULATORY_CARE_PROVIDER_SITE_OTHER): Payer: Self-pay

## 2018-07-30 ENCOUNTER — Ambulatory Visit
Admission: RE | Admit: 2018-07-30 | Discharge: 2018-07-30 | Disposition: A | Discharge status: Home / Self Care | Payer: Medicare HMO | Source: Ambulatory Visit | Attending: Radiation Oncology | Admitting: Radiation Oncology

## 2018-07-30 ENCOUNTER — Other Ambulatory Visit: Payer: Self-pay

## 2018-07-30 VITALS — BP 136/65 | HR 92 | Temp 98.4°F | Resp 18 | Ht 65.0 in | Wt 214.4 lb

## 2018-07-30 DIAGNOSIS — C541 Malignant neoplasm of endometrium: Secondary | ICD-10-CM

## 2018-07-30 DIAGNOSIS — Z885 Allergy status to narcotic agent status: Secondary | ICD-10-CM | POA: Diagnosis not present

## 2018-07-30 DIAGNOSIS — Z7984 Long term (current) use of oral hypoglycemic drugs: Secondary | ICD-10-CM | POA: Insufficient documentation

## 2018-07-30 DIAGNOSIS — Z9049 Acquired absence of other specified parts of digestive tract: Secondary | ICD-10-CM | POA: Diagnosis not present

## 2018-07-30 DIAGNOSIS — Z79899 Other long term (current) drug therapy: Secondary | ICD-10-CM | POA: Diagnosis not present

## 2018-07-30 DIAGNOSIS — I10 Essential (primary) hypertension: Secondary | ICD-10-CM | POA: Diagnosis not present

## 2018-07-30 DIAGNOSIS — E7800 Pure hypercholesterolemia, unspecified: Secondary | ICD-10-CM | POA: Insufficient documentation

## 2018-07-30 DIAGNOSIS — E1140 Type 2 diabetes mellitus with diabetic neuropathy, unspecified: Secondary | ICD-10-CM | POA: Insufficient documentation

## 2018-07-30 DIAGNOSIS — Z85038 Personal history of other malignant neoplasm of large intestine: Secondary | ICD-10-CM | POA: Insufficient documentation

## 2018-07-30 NOTE — Progress Notes (Signed)
Radiation Oncology         [phone] ________________________________  Initial Outpatient Consultation  Name: Andrea Hoffman MRN: [id number]  Date: 07/30/2018  DOB: [birth year]  WH:[id number], Marshall Cork., MD  [name], MD   REFERRING PHYSICIAN: [name], MD  DIAGNOSIS: Stage IB, Grade I Endometrial Adenocarcinoma  HISTORY OF PRESENT ILLNESS::Andrea Hoffman is a 72 y.o. female who is here today for radiation options for endometrial cancer. She is accompanied by her husband. She presented to her PCP with postmenopausal bleeding with some cramps for a few months in May 2019. She was then referred to Dr. Octaviano Glow for work-up. A Pap smear was done revealing AGUS with HRHPV negative. A cervical polyp was removed and found to be benign. Followup TVUS showing thickened EM lining and bilateral adnexal masses. Endometrial biopsy revealed grade 1 endometrial adenocarcinoma.   On 06/19/18, she underwent chest and abdomen/pelvis CT scans. They revealed: 1. Thickened irregular endometrium consistent with patient's known endometrial cancer. I do not see any obvious extension beyond myometrium. 2. Large right adnexal mass could represent a solid ovarian neoplasm or a large exophytic fibroid. MRI may be helpful for further evaluation. The left ovary is normal. 3. Enhancing 17 mm left medial breast mass. Recommend correlation with physical exam, diagnostic mammography and ultrasound. 4. Numerous small pulmonary nodules are likely benign as above. 5. Small pelvic lymph nodes are likely benign.  On 07/01/18, she underwent total hysterectomy with bilateral salpingo-oophorectomy. The pathology report from the procedure revealed:  1. Uterus, cervix and bilateral fallopian tubes, left ovary - ENDOMETRIOID ADENOCARCINOMA, 4.1 CM, FIGO GRADE 1. - carcinoma invades for a depth of 1.3 cm where myometrial thickness is 2.0 cm (>50%) - negative for lymphovascular or perineural invasion. - benign  leiomyomata, cervical polyp, and unremarkable bilateral fallopian tubes and left ovary. 2. Ovary, right - adult-type granulosa cell tumor of right ovary, 12 cm, with areas of possible infarct and associated fibrosis - ovarian surface is not involved by tumor - no significant mitotic activity or necrosis The 12 lymph nodes (4 sentinel and 8 non-sentinel) were all negative for metastasis, 12/12.  She notes mild swelling to the legs and denies abdominal swelling since surgery. She also denies dysuria or hematuria, nausea or vomiting, and rectal bleeding.  The breast mass found during the CT scan was re-evaluated on a mammagram on 07/2018, showing a 2 cm lesion in the left breast with blood flow. A biopsy will be performed, and will be followed by Dr. Gerarda Fraction, who believes it is a cyst.  PREVIOUS RADIATION THERAPY: No  PAST MEDICAL HISTORY:  has a past medical history of Anemia, Broken arm, Bunion, Colon cancer ([place]), Cyanocobalamin deficiency, Depression, Diabetes mellitus without complication ([place]), Diabetic neuropathy ([place]), Dyspnea, Endometrial cancer ([place]), Hammer toe, High cholesterol, History of pneumonia, Hypertension, Migraines, Pulmonary nodules, and Rheumatic fever.    PAST SURGICAL HISTORY: Past Surgical History:  Procedure Laterality Date  . AMPUTATION TOE  2014   2 toes amputated  . COLONOSCOPY    . LAPAROTOMY WITH STAGING N/A 07/01/2018   Procedure: POSSIBLE LAPAROTOMY WITH STAGING;  Surgeon: [name], MD;  Location: [facility];  Service: Gynecology;  Laterality: N/A;  . PARTIAL COLECTOMY  2002 or 2003   Colon cancer [facility] - "took 18-22 inches"  . ROBOTIC ASSISTED TOTAL HYSTERECTOMY WITH BILATERAL SALPINGO OOPHERECTOMY Bilateral 07/01/2018   Procedure: XI ROBOTIC ASSISTED TOTAL HYSTERECTOMY WITH BILATERAL SALPINGO OOPHORECTOMY;  Surgeon: [name], MD;  Location: [facility];  Service: Gynecology;  Laterality: Bilateral;  . SENTINEL NODE BIOPSY N/A 07/01/2018    Procedure: SENTINEL NODE BIOPSY;  Surgeon: [name], MD;  Location: [facility];  Service: Gynecology;  Laterality: N/A;  . TONSILLECTOMY     Patient had her tonsils taken out at age 26  . TUBAL LIGATION  1974  . UPPER GI ENDOSCOPY      FAMILY HISTORY: family history includes Breast cancer in her maternal aunt, maternal aunt, and mother; Cancer in her cousin and cousin; Colon cancer in her maternal uncle and maternal uncle; Diabetes in her brother and maternal grandmother; Heart attack in her father; Lung cancer in her mother.  SOCIAL HISTORY:  reports that she has never smoked. She has never used smokeless tobacco. She reports that she does not drink alcohol or use drugs.  ALLERGIES: Hydrocodone  MEDICATIONS:  Current Outpatient Medications  Medication Sig Dispense Refill  . allopurinol (ZYLOPRIM) 100 MG tablet Take 100 mg by mouth daily.    . Alpha-Lipoic Acid 200 MG CAPS Take 200-400 mg by mouth See admin instructions. Take 200 mg by mouth in the morning and take 400 mg by mouth at bedtime    . amLODipine (NORVASC) 10 MG tablet Take 10 mg by mouth daily.     . carvedilol (COREG) 6.25 MG tablet Take 6.25 mg by mouth 2 (two) times daily with a meal.     . citalopram (CELEXA) 40 MG tablet Take 0.5 tablets (20 mg total) by mouth at bedtime. Take 20 mg once daily    . glucose blood (ONE TOUCH ULTRA TEST) test strip     . hydrochlorothiazide (HYDRODIURIL) 25 MG tablet Take 25 mg by mouth daily.     Marland Kitchen lisinopril (PRINIVIL,ZESTRIL) 20 MG tablet Take 20 mg by mouth daily.     . metFORMIN (GLUCOPHAGE) 500 MG tablet Take 1 tablet (500 mg total) by mouth 2 (two) times daily with a meal. Take one tablet (500 mg total) in the am and 500 mg in the pm until seen in the office for repeat creatinine level.    . nitroGLYCERIN (NITROSTAT) 0.4 MG SL tablet Place 0.4 mg under the tongue every 5 (five) minutes as needed for chest pain.     [name] DELICA LANCETS 81E MISC     . ONETOUCH VERIO test strip  USE STRIP TO CHECK GLUCOSE AS DIRECTED ONCE DAILY IN THE MORNING  99  . simvastatin (ZOCOR) 40 MG tablet Take 0.5 tablets (20 mg total) by mouth at bedtime. Until you see your primary care doctor    . traMADol (ULTRAM) 50 MG tablet Take 1 tablet (50 mg total) by mouth every 6 (six) hours as needed for severe pain. 20 tablet 0   No current facility-administered medications for this encounter.     REVIEW OF SYSTEMS:  A 10+ POINT REVIEW OF SYSTEMS WAS OBTAINED including neurology, dermatology, psychiatry, cardiac, respiratory, lymph, extremities, GI, GU, musculoskeletal, constitutional, reproductive, HEENT. All pertinent positives are noted in the HPI. All others are negative.   PHYSICAL EXAM:  height is 5\' 5"  (1.651 m) and weight is 214 lb 6.4 oz (97.3 kg). Her oral temperature is 98.4 F (36.9 C). Her blood pressure is 136/65 and her pulse is 92. Her respiration is 18 and oxygen saturation is 100%.   General: Alert and oriented, in no acute distress HEENT: Head is normocephalic. Extraocular movements are intact. Oropharynx is clear. Neck: Neck is supple, no palpable cervical or supraclavicular lymphadenopathy. Heart:  Regular in rate and rhythm with no murmurs, rubs, or gallops. Chest: Clear to auscultation bilaterally, with no rhonchi, wheezes, or rales. Abdomen: Soft, nontender, nondistended, with no rigidity or guarding. Patient's laparotomy scars are healing well, without signs of drainage or infection. Extremities: No cyanosis or edema. Lymphatics: see Neck Exam Skin: No concerning lesions. Musculoskeletal: symmetric strength and muscle tone throughout. Neurologic: Cranial nerves II through XII are grossly intact. No obvious focalities. Speech is fluent. Coordination is intact. Psychiatric: Judgment and insight are intact. Affect is appropriate. Pelvic exam deferred until planning day in light of recent surgery.  ECOG = 1  0 - Asymptomatic (Fully active, able to carry on all predisease  activities without restriction)  1 - Symptomatic but completely ambulatory (Restricted in physically strenuous activity but ambulatory and able to carry out work of a light or sedentary nature. For example, light housework, office work)  2 - Symptomatic, <50% in bed during the day (Ambulatory and capable of all self care but unable to carry out any work activities. Up and about more than 50% of waking hours)  3 - Symptomatic, >50% in bed, but not bedbound (Capable of only limited self-care, confined to bed or chair 50% or more of waking hours)  4 - Bedbound (Completely disabled. Cannot carry on any self-care. Totally confined to bed or chair)  5 - Death   Eustace Pen MM, Creech RH, Tormey DC, et al. [phone]). "Toxicity and response criteria of the [facility]". [place] Oncol. 5 (6): 649-55  LABORATORY DATA:  Lab Results  Component Value Date   WBC 9.2 07/02/2018   HGB 8.9 (L) 07/02/2018   HCT 30.4 (L) 07/02/2018   MCV 72.8 (L) 07/02/2018   PLT 275 07/02/2018   Lab Results  Component Value Date   NA 135 07/10/2018   K 4.4 07/10/2018   CL 101 07/10/2018   CO2 21 (L) 07/10/2018   GLUCOSE 237 (H) 07/10/2018   CREATININE 1.26 (H) 07/10/2018   CALCIUM 9.0 07/10/2018      RADIOGRAPHY: No results found.    IMPRESSION: Stage IB, Grade I Endometrial Adenocarcinoma  The patient would be a good candidate for vaginal brachytherapy. Given the depth of invasion in the myometrium and her age over 46, she is at risk for vaginal cuff recurrence.  Today, I talked to the patient and husband about the findings and work-up thus far.  We discussed the natural history of endometrial cancer and general treatment, highlighting the role of brachytherapy in the management.  We discussed the available radiation techniques, and focused on the details of logistics and delivery.  We reviewed the anticipated acute and late sequelae associated with radiation in this setting.  The patient  was encouraged to ask questions that I answered to the best of my ability.  A patient consent form was discussed and signed.  We retained a copy for our records.  The patient would like to proceed with radiation and will be scheduled for CT simulation.    PLAN: She is scheduled to follow up with Dr. Gerarda Fraction on 08/10/18. The patient will be scheduled to begin brachytherapy approximately 6 weeks out from her surgery. She is scheduled for genetics counseling given personal and family history of colon cancer. She will be on vacation 08/24/18 - 08/29/18 and we'll avoid brachytherapy treatments during this week.  anticipate 5 brachytherapy treatments directed at the vaginal cuff.     ------------------------------------------------  Blair Promise, PhD, MD  This document serves  as a record of services personally performed by Gery Pray, MD. It was created on his behalf by Wilburn Mylar, a trained medical scribe. The creation of this record is based on the scribe's personal observations and the provider's statements to them. This document has been checked and approved by the attending provider.

## 2018-07-31 ENCOUNTER — Telehealth: Payer: Self-pay | Admitting: Licensed Clinical Social Worker

## 2018-07-31 NOTE — Telephone Encounter (Signed)
Patient left voicemail regarding resch appt.  Left message for patient to call us back so that we may help her to reschedule.

## 2018-07-31 NOTE — Telephone Encounter (Signed)
Called patient regarding vmail message that was left wanting to reschedule appt to 9/19 or 10/3.  Called patient and left message that we have rescheduled appt to 10/3 @ 11:00 am.  Mailed calendar.

## 2018-08-06 ENCOUNTER — Telehealth: Payer: Self-pay | Admitting: Obstetrics

## 2018-08-06 NOTE — Telephone Encounter (Signed)
Spoke to pt regarding vm she left to r/s appts.

## 2018-08-07 ENCOUNTER — Telehealth: Payer: Self-pay | Admitting: *Deleted

## 2018-08-07 NOTE — Telephone Encounter (Signed)
CALLED PATIENT TO INFORM OF NEW HDR [facility], SPOKE WITH PATIENT AND SHE IS AWARE OF THESE APPTS. °

## 2018-08-08 NOTE — Progress Notes (Signed)
[place] at [facility]   Progress Note: Established Patient Follow-up Visit   Consult was originally requested by Dr. Ashby Dawes  Chief Complaint  Patient presents with  . Endometrial cancer [facility])   Gyn Oncologic Summary 1. 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 HIR Stage IB, Grade 1 Endometrioid Endometrial Adenocarcinoma   07/01/18 Robotic-assisted TLH/BSO pelvic lymphadenectomy and SLN biopsy, Pelvic washings, Omentectomy   Planning vag brachy ____  HPI: [name]  is a very nice 72 y.o.  P4  . Interval History Since her last visit she was presented at Tumor Board (see separate notes in EPIC). Agreement is for radiation (brachy), genetics, and for her breast followup.  She returns for a vaginal cuff check and followup on the above pending items.  No complaints. Has surgery TBS with surgeon in [place]. States the breast lesions isn't "cancer" but could "turn into cancer"  . Oncologic Course  She noted postmenopausal bleeding described as spotting a few months prior to presentation. Noted some cramps, otherwise no significant symptoms. She called her PCP to report the bleeding and then was referred to Dr. Octaviano Glow for work-up. A Pap smear was done revealing AGUS with HRHPV negative. A cervical polyp was removed and found to be benign.  Followup TVUS showing thickened EM lining and bilateral adnexal masses. Endometrial biopsy  (read by [facility]) revealed grade 1 endometrial adenocarcinoma.   CA125 elevated for the lab at 30.7 (lab normal <21) CEA mildly elevated at 3.3 (lab normal <3 for nonsmokers)  Denies nausea, vomiting, change in appetite. Has lost about 7 pounds in 6 months. Some pelvic cramping.  Has history of colon cancer operated on in [facility]. States had normal colonoscopy 2018.  She underwent on 07/01/18 Robotic-assisted TLH/BSO pelvic lymphadenectomy and SLN biopsy, Pelvic  washings, Omentectomy (partial) Findings included ; Adhesions of omentum to anterior abdominal wall. Normal sized uterus. Normal appearing left ovary. Right ovary enlarged and firm; bilobed. Frozen on uterus >50% depth of invasion. 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 Sentinel uptake left and right external iliac vessels, both proximal and distal, so 4 total sentinel lymph nodes. Mild adenopathy in right proximal external node. No gross extrapelvic disease  There were no perioperative complications.   Final pathology revealed: Uterus, cervix and bilateral fallopian tubes, left ovary - ENDOMETRIOID ADENOCARCINOMA, 4.1 CM, FIGO GRADE 1. - CARCINOMA INVADES FOR A DEPTH OF 1.3 CM WHERE MYOMETRIAL THICKNESS IS 2.0 CM (GREATER THAN 50%). - NEGATIVE FOR LYMPHOVASCULAR OR PERINEURAL INVASION. - BENIGN LEIOMYOMATA. - BENIGN CERVICAL POLYP. - BENIGN UNREMARKABLE BILATERAL FALLOPIAN TUBES AND LEFT OVARY. - SEE ONCOLOGY TABLE. - SEE NOTE. 2. 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 - OVARIAN SURFACE IS NOT INVOLVED BY TUMOR. - NO SIGNIFICANT MITOTIC ACTIVITY OR NECROSIS. - SEE ONCOLOGY TABLE. - SEE NOTE. 3. Lymph node, sentinel, biopsy, right external iliac - LYMPH NODE, NEGATIVE FOR CARCINOMA (0/1). - IMMUNOHISTOCHEMICAL STAIN FOR PANKERATIN (AE1/AE3) IS NEGATIVE 4. Lymph node, sentinel, biopsy, right distal external iliac - LYMPH NODE, NEGATIVE FOR CARCINOMA (0/1). - IMMUNOHISTOCHEMICAL STAIN FOR PANKERATIN (AE1/AE3) IS NEGATIVE 5. Lymph node, sentinel, biopsy, left external iliac - LYMPH NODE, NEGATIVE FOR CARCINOMA (0/1). - IMMUNOHISTOCHEMICAL STAIN FOR PANKERATIN (AE1/AE3) IS NEGATIVE. 6. Lymph node, sentinel, biopsy, left distal external iliac - LYMPH NODE, NEGATIVE FOR CARCINOMA (0/1). - IMMUNOHISTOCHEMICAL STAIN FOR PANKERATIN (  AE1/AE3) IS NEGATIVE 7. Lymph nodes,  regional resection, left pelvic - FIVE LYMPH NODES, NEGATIVE FOR CARCINOMA (0/5). 8. Lymph nodes, regional resection, right pelvic - THREE LYMPH NODES, NEGATIVE FOR TUMOR (0/3). 9. Omentum, resection for tumor - PORTION OF OMENTUM, NEGATIVE FOR TUMOR. Microscopic Comment 1. UTERUS, CARCINOMA OR CARCINOSARCOMA Procedure: Total hysterectomy with bilateral salpingo-oophorectomy. Histologic type: Endometrioid adenocarcinoma. Histologic Grade: FIGO grade 1. Myometrial invasion: Present. Depth of invasion: 13 mm. Myometrial thickness: 20 mm. Uterine Serosa Involvement: Not identified. Cervical stromal involvement: Not identified. Extent of involvement of other organs: Not applicable. Lymphovascular invasion: Not identified. 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   Imported EPIC Oncologic History:    No history exists.    Measurement of disease:  Inhibin B 06/25/18 - preop 510.6 Recent Labs    06/25/18 1409  INHBB 510.6*    CA125 05/21/18 - Preop CA125 elevated for the lab at 30.7 (lab normal <21)     Radiology:  05/19/18 - TVUS -  8.7 x 5 x 4cm uterus , with 2 cm Em lining avascular. 2 complex masses in CDS 5.5x5x6cm and 6x6.3x6.4 cm vascular (ovaries versus other). No results found.    Current Meds:  Outpatient Encounter Medications as of 08/10/2018  Medication Sig  . allopurinol (ZYLOPRIM) 100 MG tablet Take 100 mg by mouth daily.  . Alpha-Lipoic Acid 200 MG CAPS Take 200-400 mg by mouth See admin instructions. Take 200 mg by mouth in the morning and take 400 mg by mouth at bedtime  . amLODipine (NORVASC) 10 MG tablet Take 10 mg by mouth daily.   . carvedilol (COREG) 6.25 MG tablet Take 6.25 mg by mouth 2 (two) times daily with a meal.   . citalopram (CELEXA) 40  MG tablet Take 0.5 tablets (20 mg total) by mouth at bedtime. Take 20 mg once daily  . glucose blood (ONE TOUCH ULTRA TEST) test strip   . hydrochlorothiazide (HYDRODIURIL) 25 MG tablet Take 25 mg by mouth daily.   Marland Kitchen lisinopril (PRINIVIL,ZESTRIL) 20 MG tablet Take 20 mg by mouth daily.   . metFORMIN (GLUCOPHAGE) 500 MG tablet Take 1 tablet (500 mg total) by mouth 2 (two) times daily with a meal. Take one tablet (500 mg total) in the am and 500 mg in the pm until seen in the office for repeat creatinine level.  . nitroGLYCERIN (NITROSTAT) 0.4 MG SL tablet Place 0.4 mg under the tongue every 5 (five) minutes as needed for chest pain.   [name] DELICA LANCETS 95A MISC   . ONETOUCH VERIO test strip USE STRIP TO CHECK GLUCOSE AS DIRECTED ONCE DAILY IN THE MORNING  . simvastatin (ZOCOR) 40 MG tablet Take 0.5 tablets (20 mg total) by mouth at bedtime. Until you see your primary care doctor  . [DISCONTINUED] traMADol (ULTRAM) 50 MG tablet Take 1 tablet (50 mg total) by mouth every 6 (six) hours as needed for severe pain.   No facility-administered encounter medications on file as of 08/10/2018.     Allergy:  Allergies  Allergen Reactions  . Hydrocodone Other (See Comments)    Hallucinations    Past Surgical Hx:  Past Surgical History:  Procedure Laterality Date  . AMPUTATION TOE  2014   2 toes amputated  . COLONOSCOPY    . LAPAROTOMY  WITH STAGING N/A 07/01/2018   Procedure: POSSIBLE LAPAROTOMY WITH STAGING;  Surgeon: [name], MD;  Location: [facility];  Service: Gynecology;  Laterality: N/A;  . PARTIAL COLECTOMY  2002 or 2003   Colon cancer [facility] - "took 18-22 inches"  . ROBOTIC ASSISTED TOTAL HYSTERECTOMY WITH BILATERAL SALPINGO OOPHERECTOMY Bilateral 07/01/2018   Procedure: XI ROBOTIC ASSISTED TOTAL HYSTERECTOMY WITH BILATERAL SALPINGO OOPHORECTOMY;  Surgeon: [name], MD;  Location: [facility];  Service: Gynecology;  Laterality: Bilateral;  . SENTINEL NODE BIOPSY N/A  07/01/2018   Procedure: SENTINEL NODE BIOPSY;  Surgeon: [name], MD;  Location: [facility];  Service: Gynecology;  Laterality: N/A;  . TONSILLECTOMY     Patient had her tonsils taken out at age 50  . TUBAL LIGATION  1974  . UPPER GI ENDOSCOPY      Past Medical Hx:  Past Medical History:  Diagnosis Date  . Anemia   . Broken arm    Right shoulder  . Bunion   . Colon cancer ([place])    surgery followed by chemo for "6 months"  . Cyanocobalamin deficiency   . Depression   . Diabetes mellitus without complication ([place])   . Diabetic neuropathy ([place])   . Dyspnea    with exertion  . Endometrial cancer ([place])   . Hammer toe   . High cholesterol   . History of pneumonia   . Hypertension   . Migraines    history of  . Pulmonary nodules    Numerous small  . Rheumatic fever    age 33    Past Gynecological History:   GYNECOLOGIC HISTORY:  No LMP recorded. Patient is postmenopausal. Menarche: 72 years old P 4 LMP 20 years ago, then again with referral diagnosis Contraceptive never HRT 1 year  Last Pap  Referral Pap 05/2018 AGUS with negative HRHPV  Family Hx:  Family History  Problem Relation Age of Onset  . Heart attack Father   . Diabetes Brother   . Diabetes Maternal Grandmother   . Lung cancer Mother   . Breast cancer Mother        ? bony mets  . Breast cancer Maternal Aunt   . Colon cancer Maternal Uncle   . Breast cancer Maternal Aunt   . Cancer Cousin        unknown type in their 51's  . Cancer Cousin        Lung in their 64's  . Colon cancer Maternal Uncle     Social Hx:  Tobacco use: never Alcohol use: never Illicit Drug use: denies Illicit IV Drug use: denies  Review of Systems:  Review of Systems  Constitutional: Negative.   HENT:  Negative.   Eyes: Negative.   Respiratory: Negative.   Cardiovascular: Negative.   Gastrointestinal: Negative.   Endocrine: Negative.   Genitourinary: Negative.    Musculoskeletal: Negative.   Skin: Negative.    Neurological: Negative.   Hematological: Negative.   Psychiatric/Behavioral: Negative.     Vitals:  Vitals:   08/10/18 1148  BP: 125/61  Pulse: 90  Resp: 18  Temp: 98.6 F (37 C)  SpO2: 98%   Body mass index is 34.95 kg/m.   Physical Exam:  ECOG PERFORMANCE STATUS: 0 - Asymptomatic   General :  Well developed, 72 y.o., female in no apparent distress HEENT:  Normocephalic/atraumatic, symmetric, EOMI, eyelids normal Neck:   Supple, no masses.  Lymphatics:  No cervical/ submandibular/ supraclavicular/ infraclavicular/ inguinal adenopathy Respiratory:  Respirations  unlabored, no use of accessory muscles CV:   Deferred Breast:  Deferred Musculoskeletal: No CVA tenderness, normal muscle strength. Abdomen:  Wounds are CDI/healed. Soft, non-tender and nondistended. No evidence of hernia. No masses. Extremities:  No lymphedema, no erythema, non-tender. Skin:   Normal inspection Neuro/Psych:  No focal motor deficit, no abnormal mental status. Normal gait. Normal affect. Alert and oriented to person, place, and time  Genito Urinary: Vulva: Normal external female genitalia.  Bladder/urethra: Urethral meatus normal in size and location. No lesions or   masses, well supported bladder Speculum exam: Vagina: Vaginal cuff intact. No lesion. Bimanual exam: no mass  Cervix/Uterus/Adnexa: Surgically absent Rectovaginal:  Deferred.   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 Path was previously reviewed and she was given a copy of that and the operative note 2. Stage IA Granulosa Cell Tumor o No further treatment pending tumor board 3. Stage IA Endometrial Cancer o HIR -- >50% DOI and >70 yo o Cleared for radiation - brachy to decrease her risk of local recurrence 4. Breast lesion o Followup with surgeon in [place]. Surgery is to be scheduled per patient report. 5. Genetics to be kept  in planning given personal and family h/o colon cancer o We will get her in if she hasn't yet been seen once her brachy and breast surgery have been completed so she is not confusing appointments and missing followup. 6. RTC 3 months for first surveillance o We will plan for pelvic Q 20months along with GCT tumor markers with each visit o If brachy then no benefit to annual Pap  Face to face time with patient was 15 minutes. Over 50% of this time was spent on counseling and coordination of care. This included discussion about prognosis, therapy recommendations including the breast biopsy, radiation for the vaginal cuff, and genetics that are beyond the scope of routine postoperative care.   [name], MD  08/10/2018, 12:12 PM    Cc: Ashby Dawes, DO (Referring Ob/Gyn) Cecille Amsterdam, DO (PCP) Gery Pray, MD (Radiation Oncologist)

## 2018-08-10 ENCOUNTER — Inpatient Hospital Stay: Payer: Medicare HMO | Attending: Obstetrics | Admitting: Obstetrics

## 2018-08-10 ENCOUNTER — Encounter: Payer: Self-pay | Admitting: Obstetrics

## 2018-08-10 ENCOUNTER — Telehealth: Payer: Self-pay

## 2018-08-10 VITALS — BP 125/61 | HR 90 | Temp 98.6°F | Resp 18 | Ht 65.0 in | Wt 210.0 lb

## 2018-08-10 DIAGNOSIS — C541 Malignant neoplasm of endometrium: Secondary | ICD-10-CM | POA: Insufficient documentation

## 2018-08-10 DIAGNOSIS — Z90710 Acquired absence of both cervix and uterus: Secondary | ICD-10-CM

## 2018-08-10 DIAGNOSIS — Z90722 Acquired absence of ovaries, bilateral: Secondary | ICD-10-CM | POA: Insufficient documentation

## 2018-08-10 NOTE — Patient Instructions (Signed)
1. Return in 3 months for a pelvic 2. Keep your followup that should be scheduled with Dr. Sondra Come to discuss radiation start 3. Keep your followup for the breast diagnosis

## 2018-08-10 NOTE — Telephone Encounter (Signed)
Per Joylene John NP - faxed EKG from 06-25-2018 to Dr Wendie Agreste pt's PCP.  Spoke to office personnel "Dan Maker" and she will be expecting to receive fax today to give to MD and/or his nurse.

## 2018-08-17 ENCOUNTER — Ambulatory Visit
Admission: RE | Admit: 2018-08-17 | Discharge: 2018-08-17 | Disposition: A | Discharge status: Home / Self Care | Payer: Medicare HMO | Source: Ambulatory Visit | Attending: Radiation Oncology | Admitting: Radiation Oncology

## 2018-08-17 ENCOUNTER — Other Ambulatory Visit: Payer: Self-pay

## 2018-08-17 ENCOUNTER — Encounter: Payer: Self-pay | Admitting: Radiation Oncology

## 2018-08-17 ENCOUNTER — Ambulatory Visit: Payer: Self-pay | Admitting: Radiation Oncology

## 2018-08-17 VITALS — BP 135/58 | HR 95 | Temp 99.1°F | Resp 20 | Ht 65.0 in | Wt 215.0 lb

## 2018-08-17 DIAGNOSIS — C541 Malignant neoplasm of endometrium: Secondary | ICD-10-CM

## 2018-08-17 DIAGNOSIS — Z510 Encounter for antineoplastic radiation therapy: Secondary | ICD-10-CM | POA: Insufficient documentation

## 2018-08-17 NOTE — Progress Notes (Signed)
  Radiation Oncology         [phone] ________________________________  Name: Andrea Hoffman MRN: [id number]  Date: 08/17/2018  DOB: Jun 08, 1946  CC: [name], MD  [name], MD  HDR BRACHYTHERAPY NOTE  DIAGNOSIS: Stage IB, Grade I Endometrial Adenocarcinoma   Simple treatment device note: Patient had construction of her custom vaginal cylinder. She will be treated with a 3.0 cm diameter segmented cylinder. This conforms to her anatomy without undue discomfort.  Vaginal brachytherapy procedure node: The patient was brought to the [place] suite. Identity was confirmed. All relevant records and images related to the planned course of therapy were reviewed. The patient freely provided informed written consent to proceed with treatment after reviewing the details related to the planned course of therapy. The consent form was witnessed and verified by the simulation staff. Then, the patient was set-up in a stable reproducible supine position for radiation therapy. Pelvic exam revealed the vaginal cuff to be intact . The patient's custom vaginal cylinder was placed in the proximal vagina. This was affixed to the CT/MR stabilization plate to prevent slippage. Patient tolerated the placement well.  Verification simulation note:  A fiducial marker was placed within the vaginal cylinder. An AP and lateral film was then obtained through the pelvis area. This documented accurate position of the vaginal cylinder for treatment.  HDR BRACHYTHERAPY TREATMENT  The remote afterloading device was affixed to the vaginal cylinder by catheter. Patient then proceeded to undergo her first high-dose-rate treatment directed at the proximal vagina. The patient was prescribed a dose of 6.0 gray to be delivered to the mucosal surface. Treatment length was 3.0 cm. Patient was treated with 1 channel using 7 dwell positions. Treatment time was 323.0 seconds. Iridium 192 was the high-dose-rate source for  treatment. The patient tolerated the treatment well. After completion of her therapy, a radiation survey was performed documenting return of the iridium source into the Gamma[employer] safe.   PLAN: the patient will return later this week for her second high-dose-rate treatment. ________________________________  Blair Promise, PhD, MD

## 2018-08-17 NOTE — Progress Notes (Signed)
.   Radiation Oncology         ([phone] ________________________________  Name: Andrea Hoffman MRN: [id number]  Date: 08/17/2018  DOB: [birth year]  Vaginal Brachytherapy Procedure Note  CC: [name], MD [name], MD    ICD-10-CM   1. Endometrial adenocarcinoma (HCC) C54.1     Diagnosis: Stage IB, Grade IEndometrial Adenocarcinoma    Narrative: She returns today for vaginal cylinder fitting. She's been doing well since her surgery. She underwent pelvic exam by Dr. Gerarda Fraction last week normal healing of the vaginal cuff. The patient reports no vaginal bleeding or pelvic pain  ALLERGIES: is allergic to hydrocodone.  Meds: Current Outpatient Medications  Medication Sig Dispense Refill  . allopurinol (ZYLOPRIM) 100 MG tablet Take 100 mg by mouth daily.    . Alpha-Lipoic Acid 200 MG CAPS Take 200-400 mg by mouth See admin instructions. Take 200 mg by mouth in the morning and take 400 mg by mouth at bedtime    . amLODipine (NORVASC) 10 MG tablet Take 10 mg by mouth daily.     . carvedilol (COREG) 6.25 MG tablet Take 6.25 mg by mouth 2 (two) times daily with a meal.     . citalopram (CELEXA) 40 MG tablet Take 0.5 tablets (20 mg total) by mouth at bedtime. Take 20 mg once daily    . glucose blood (ONE TOUCH ULTRA TEST) test strip     . hydrochlorothiazide (HYDRODIURIL) 25 MG tablet Take 25 mg by mouth daily.     Marland Kitchen lisinopril (PRINIVIL,ZESTRIL) 20 MG tablet Take 20 mg by mouth daily.     . metFORMIN (GLUCOPHAGE) 500 MG tablet Take 1 tablet (500 mg total) by mouth 2 (two) times daily with a meal. Take one tablet (500 mg total) in the am and 500 mg in the pm until seen in the office for repeat creatinine level.    . nitroGLYCERIN (NITROSTAT) 0.4 MG SL tablet Place 0.4 mg under the tongue every 5 (five) minutes as needed for chest pain.     [name] DELICA LANCETS 36O MISC     . ONETOUCH VERIO test strip USE STRIP TO CHECK GLUCOSE AS DIRECTED ONCE DAILY IN THE MORNING  99   . simvastatin (ZOCOR) 40 MG tablet Take 0.5 tablets (20 mg total) by mouth at bedtime. Until you see your primary care doctor     No current facility-administered medications for this encounter.     Physical Findings: The patient is in no acute distress. Patient is alert and oriented.  height is 5\' 5"  (1.651 m) and weight is 215 lb (97.5 kg). Her oral temperature is 99.1 F (37.3 C). Her blood pressure is 135/58 (abnormal) and her pulse is 95. Her respiration is 20 and oxygen saturation is 96%.   No palpable cervical, supraclavicular or axillary lymphoadenopathy. The heart has a regular rate and rhythm. The lungs are clear to auscultation. Abdomen soft and non-tender.  On pelvic examination the external genitalia were unremarkable. A speculum exam was performed. Vaginal cuff intact, no mucosal lesions. On bimanual exam there were no pelvic masses appreciated.  Lab Findings: Lab Results  Component Value Date   WBC 9.2 07/02/2018   HGB 8.9 (L) 07/02/2018   HCT 30.4 (L) 07/02/2018   MCV 72.8 (L) 07/02/2018   PLT 275 07/02/2018    Radiographic Findings: No results found.  Impression: Stage IB, Grade IEndometrial Adenocarcinoma  Patient was fitted for a vaginal cylinder. The patient will be treated with  a 3.0 cm diameter cylinder with a treatment length of 3.0 cm. This distended the vaginal vault without undue discomfort. The patient tolerated the procedure well.  The patient was successfully fitted for a vaginal cylinder. The patient is appropriate to begin vaginal brachytherapy.   Plan: The patient will proceed with CT simulation and vaginal brachytherapy today.    _______________________________   Blair Promise, PhD, MD

## 2018-08-17 NOTE — Progress Notes (Signed)
  Radiation Oncology         [phone] ________________________________  Name: BINTOU LAFATA MRN: [id number]  Date: 08/17/2018  DOB: 1946-02-16  SIMULATION AND TREATMENT PLANNING NOTE HDR BRACHYTHERAPY  DIAGNOSIS:  Stage IB, Grade IEndometrial Adenocarcinoma  NARRATIVE:  The patient was brought to the [place] suite.  Identity was confirmed.  All relevant records and images related to the planned course of therapy were reviewed.  The patient freely provided informed written consent to proceed with treatment after reviewing the details related to the planned course of therapy. The consent form was witnessed and verified by the simulation staff.  Then, the patient was set-up in a stable reproducible  supine position for radiation therapy.  CT images were obtained.  Surface markings were placed.  The CT images were loaded into the planning software.  Then the target and avoidance structures were contoured.  Treatment planning then occurred.  The radiation prescription was entered and confirmed.   I have requested : Brachytherapy Isodose Plan and Dosimetry Calculations to plan the radiation distribution.    PLAN:  The patient will receive 30 Gy in 5 fractions directed at the vaginal cuff region. The patient will be treated with a 3.0 cm diameter segmented cylinder with a treatment length of 3 cm. Iridium 192 will be the high-dose-rate source. Prescription will be to the mucosal surface.    ________________________________  Blair Promise, PhD, MD

## 2018-08-17 NOTE — Progress Notes (Signed)
Pt presents today for new [place] HDR fitting and CT Simulation. Pt denies dysuria/hematuria. Pt denies vaginal bleeding/discharge. Pt denies rectal bleeding or diarrhea. Pt reports constipation that is attributed to PMH colon cancer. Pt denies abdominal bloating or nausea/vomiting.   BP (!) 135/58 (BP Location: Left Arm, Patient Position: Sitting)   Pulse 95   Temp 99.1 F (37.3 C) (Oral)   Resp 20   Ht 5\' 5"  (1.651 m)   Wt 215 lb (97.5 kg)   SpO2 96%   BMI 35.78 kg/m    Wt Readings from Last 3 Encounters:  08/17/18 215 lb (97.5 kg)  08/10/18 210 lb (95.3 kg)  07/30/18 214 lb 6.4 oz (97.3 kg)   Loma Sousa, RN BSN

## 2018-08-19 ENCOUNTER — Ambulatory Visit
Admission: RE | Admit: 2018-08-19 | Discharge: 2018-08-19 | Disposition: A | Discharge status: Home / Self Care | Payer: Medicare HMO | Source: Ambulatory Visit | Attending: Radiation Oncology | Admitting: Radiation Oncology

## 2018-08-19 DIAGNOSIS — C541 Malignant neoplasm of endometrium: Secondary | ICD-10-CM

## 2018-08-19 DIAGNOSIS — Z510 Encounter for antineoplastic radiation therapy: Secondary | ICD-10-CM | POA: Diagnosis not present

## 2018-08-19 NOTE — Progress Notes (Signed)
  Radiation Oncology         ([phone] ________________________________  Name: Andrea Hoffman MRN: [id number]  Date: 08/19/2018  DOB: 1945-12-27  CC: [name], MD  [name], MD  HDR BRACHYTHERAPY NOTE  DIAGNOSIS: Stage IB, Grade IEndometrial Adenocarcinoma   Simple treatment device note: Patient had construction of her custom vaginal cylinder. She will be treated with a 3.0 cm diameter segmented cylinder. This conforms to her anatomy without undue discomfort.  Vaginal brachytherapy procedure node: The patient was brought to the [place] suite. Identity was confirmed. All relevant records and images related to the planned course of therapy were reviewed. The patient freely provided informed written consent to proceed with treatment after reviewing the details related to the planned course of therapy. The consent form was witnessed and verified by the simulation staff. Then, the patient was set-up in a stable reproducible supine position for radiation therapy. Pelvic exam revealed the vaginal cuff to be intact. The patient's custom vaginal cylinder was placed in the proximal vagina. This was affixed to the CT/MR stabilization plate to prevent slippage. Patient tolerated the placement well.  Verification simulation note:  A fiducial marker was placed within the vaginal cylinder. An AP and lateral film was then obtained through the pelvis area. This documented accurate position of the vaginal cylinder for treatment.  HDR BRACHYTHERAPY TREATMENT  The remote afterloading device was affixed to the vaginal cylinder by catheter. Patient then proceeded to undergo her second high-dose-rate treatment directed at the proximal vagina. The patient was prescribed a dose of 6.0 gray to be delivered to the mucosal surface. Treatment length was 3.0 cm. Patient was treated with 1 channel using 7 dwell positions. Treatment time was 329.1 seconds. Iridium 192 was the high-dose-rate source for  treatment. The patient tolerated the treatment well. After completion of her therapy, a radiation survey was performed documenting return of the iridium source into the [employer] safe.   PLAN: The patient will return next week for her third high-rose-rate treatment.  -----------------------------------  Blair Promise, PhD, MD  This document serves as a record of services personally performed by Gery Pray, MD. It was created on his behalf by Wilburn Mylar, a trained medical scribe. The creation of this record is based on the scribe's personal observations and the provider's statements to them. This document has been checked and approved by the attending provider.

## 2018-08-27 ENCOUNTER — Encounter: Payer: Medicare HMO | Admitting: Licensed Clinical Social Worker

## 2018-08-27 ENCOUNTER — Other Ambulatory Visit: Payer: Medicare HMO

## 2018-08-28 ENCOUNTER — Telehealth: Payer: Self-pay | Admitting: *Deleted

## 2018-08-28 NOTE — Telephone Encounter (Signed)
Called patient to remind of HDR Tx. For 08-31-18, spoke with patient's husband - Pieter Partridge and he is aware of this tx.

## 2018-08-31 ENCOUNTER — Ambulatory Visit
Admission: RE | Admit: 2018-08-31 | Discharge: 2018-08-31 | Disposition: A | Discharge status: Home / Self Care | Payer: Medicare HMO | Source: Ambulatory Visit | Attending: Radiation Oncology | Admitting: Radiation Oncology

## 2018-08-31 ENCOUNTER — Ambulatory Visit: Payer: Medicare HMO | Admitting: Radiation Oncology

## 2018-08-31 DIAGNOSIS — C541 Malignant neoplasm of endometrium: Secondary | ICD-10-CM

## 2018-08-31 DIAGNOSIS — Z510 Encounter for antineoplastic radiation therapy: Secondary | ICD-10-CM | POA: Diagnosis not present

## 2018-08-31 NOTE — Progress Notes (Signed)
  Radiation Oncology         [phone] ________________________________  Name: Andrea Hoffman MRN: [id number]  Date: 08/31/2018  DOB: [birth year]  CC: [name], MD  [name], MD  HDR BRACHYTHERAPY NOTE  DIAGNOSIS: Stage IB, Grade IEndometrial Adenocarcinoma   Simple treatment device note: Patient had construction of her custom vaginal cylinder. She will be treated with a 3.0 cm diameter segmented cylinder. This conforms to her anatomy without undue discomfort.  Vaginal brachytherapy procedure node: The patient was brought to the [place] suite. Identity was confirmed. All relevant records and images related to the planned course of therapy were reviewed. The patient freely provided informed written consent to proceed with treatment after reviewing the details related to the planned course of therapy. The consent form was witnessed and verified by the simulation staff. Then, the patient was set-up in a stable reproducible supine position for radiation therapy. Pelvic exam revealed the vaginal cuff to be intact. The patient's custom vaginal cylinder was placed in the proximal vagina. This was affixed to the CT/MR stabilization plate to prevent slippage. Patient tolerated the placement well.  Verification simulation note:  A fiducial marker was placed within the vaginal cylinder. An AP and lateral film was then obtained through the pelvis area. This documented accurate position of the vaginal cylinder for treatment.  HDR BRACHYTHERAPY TREATMENT  The remote afterloading device was affixed to the vaginal cylinder by catheter. Patient then proceeded to undergo her third high-dose-rate treatment directed at the proximal vagina. The patient was prescribed a dose of 6.0 gray to be delivered to the mucosal surface. Treatment length was 3.0 cm. Patient was treated with 1 channel using 7 dwell positions. Treatment time was 368.40 seconds. Iridium 192 was the high-dose-rate source for  treatment. The patient tolerated the treatment well. After completion of her therapy, a radiation survey was performed documenting return of the iridium source into the GammaMed safe.   PLAN: The patient will return later this week for her fourth high-rose-rate treatment.  -----------------------------------  Blair Promise, PhD, MD  This document serves as a record of services personally performed by Gery Pray, MD. It was created on his behalf by Steva Colder, a trained medical scribe. The creation of this record is based on the scribe's personal observations and the provider's statements to them. This document has been checked and approved by the attending provider.

## 2018-09-02 ENCOUNTER — Telehealth: Payer: Self-pay | Admitting: *Deleted

## 2018-09-02 NOTE — Telephone Encounter (Signed)
Called patient to remind of [place]. for 09-03-18 @ 11 am, lvm for a return call

## 2018-09-03 ENCOUNTER — Encounter: Payer: Medicare HMO | Admitting: Licensed Clinical Social Worker

## 2018-09-03 ENCOUNTER — Ambulatory Visit
Admission: RE | Admit: 2018-09-03 | Discharge: 2018-09-03 | Disposition: A | Discharge status: Home / Self Care | Payer: Medicare HMO | Source: Ambulatory Visit | Attending: Radiation Oncology | Admitting: Radiation Oncology

## 2018-09-03 ENCOUNTER — Other Ambulatory Visit: Payer: Medicare HMO

## 2018-09-03 DIAGNOSIS — Z510 Encounter for antineoplastic radiation therapy: Secondary | ICD-10-CM | POA: Insufficient documentation

## 2018-09-03 DIAGNOSIS — C541 Malignant neoplasm of endometrium: Secondary | ICD-10-CM | POA: Diagnosis not present

## 2018-09-03 NOTE — Progress Notes (Signed)
  Radiation Oncology         (336) [phone] ________________________________  Name: [name] MRN: [id number]  Date: 09/03/2018  DOB: 06-30-1946  CC: [name], MD  [name], MD  HDR BRACHYTHERAPY NOTE  DIAGNOSIS: Stage IB, Grade IEndometrial Adenocarcinoma   Simple treatment device note: Patient had construction of her custom vaginal cylinder. She will be treated with a 3.0 cm diameter segmented cylinder. This conforms to her anatomy without undue discomfort.  Vaginal brachytherapy procedure node: The patient was brought to the [place] suite. Identity was confirmed. All relevant records and images related to the planned course of therapy were reviewed. The patient freely provided informed written consent to proceed with treatment after reviewing the details related to the planned course of therapy. The consent form was witnessed and verified by the simulation staff. Then, the patient was set-up in a stable reproducible supine position for radiation therapy. Pelvic exam revealed the vaginal cuff to be intact. The patient's custom vaginal cylinder was placed in the proximal vagina. This was affixed to the CT/MR stabilization plate to prevent slippage. Patient tolerated the placement well.  Verification simulation note:  A fiducial marker was placed within the vaginal cylinder. An AP and lateral film was then obtained through the pelvis area. This documented accurate position of the vaginal cylinder for treatment.  HDR BRACHYTHERAPY TREATMENT  The remote afterloading device was affixed to the vaginal cylinder by catheter. Patient then proceeded to undergo her fourth high-dose-rate treatment directed at the proximal vagina. The patient was prescribed a dose of 6.0 gray to be delivered to the mucosal surface. Treatment length was 3.0 cm. Patient was treated with 1 channel using 7 dwell positions. Treatment time was 379.00 seconds. Iridium 192 was the high-dose-rate source for  treatment. The patient tolerated the treatment well. After completion of her therapy, a radiation survey was performed documenting return of the iridium source into the GammaMed safe.   PLAN: The patient will return next week for her fifth and final high-rose-rate treatment.  -----------------------------------  Blair Promise, PhD, MD  This document serves as a record of services personally performed by Gery Pray, MD. It was created on his behalf by Wilburn Mylar, a trained medical scribe. The creation of this record is based on the scribe's personal observations and the provider's statements to them. This document has been checked and approved by the attending provider.

## 2018-09-04 ENCOUNTER — Telehealth: Payer: Self-pay | Admitting: *Deleted

## 2018-09-04 NOTE — Telephone Encounter (Signed)
CALLED PATIENT TO REMIND OF [facility] [place]. FOR 09-07-18 @ 9 AM, LVM FOR A RETURN CALL

## 2018-09-07 ENCOUNTER — Ambulatory Visit
Admission: RE | Admit: 2018-09-07 | Discharge: 2018-09-07 | Disposition: A | Discharge status: Home / Self Care | Payer: Medicare HMO | Source: Ambulatory Visit | Attending: Radiation Oncology | Admitting: Radiation Oncology

## 2018-09-07 ENCOUNTER — Inpatient Hospital Stay: Payer: Medicare HMO

## 2018-09-07 ENCOUNTER — Inpatient Hospital Stay: Payer: Medicare HMO | Attending: Obstetrics | Admitting: Genetic Counselor

## 2018-09-07 ENCOUNTER — Ambulatory Visit: Payer: Medicare HMO | Admitting: Radiation Oncology

## 2018-09-07 ENCOUNTER — Encounter: Payer: Self-pay | Admitting: Genetic Counselor

## 2018-09-07 ENCOUNTER — Encounter: Payer: Self-pay | Admitting: Radiation Oncology

## 2018-09-07 DIAGNOSIS — C561 Malignant neoplasm of right ovary: Secondary | ICD-10-CM

## 2018-09-07 DIAGNOSIS — Z803 Family history of malignant neoplasm of breast: Secondary | ICD-10-CM | POA: Diagnosis not present

## 2018-09-07 DIAGNOSIS — Z85038 Personal history of other malignant neoplasm of large intestine: Secondary | ICD-10-CM | POA: Diagnosis not present

## 2018-09-07 DIAGNOSIS — C541 Malignant neoplasm of endometrium: Secondary | ICD-10-CM

## 2018-09-07 DIAGNOSIS — Z510 Encounter for antineoplastic radiation therapy: Secondary | ICD-10-CM | POA: Diagnosis not present

## 2018-09-07 DIAGNOSIS — Z8371 Family history of colonic polyps: Secondary | ICD-10-CM | POA: Insufficient documentation

## 2018-09-07 DIAGNOSIS — Z800 Family history of malignant neoplasm of digestive organs: Secondary | ICD-10-CM | POA: Insufficient documentation

## 2018-09-07 NOTE — Progress Notes (Signed)
  Radiation Oncology         (336) [phone] ________________________________  Name: Andrea Hoffman MRN: [id number]  Date: 09/07/2018  DOB: 01/19/1946  CC: [name], MD  [name], MD  HDR BRACHYTHERAPY NOTE  DIAGNOSIS: Stage IB, Grade IEndometrial Adenocarcinoma   Simple treatment device note: Patient had construction of her custom vaginal cylinder. She will be treated with a 3.0 cm diameter segmented cylinder. This conforms to her anatomy without undue discomfort.  Vaginal brachytherapy procedure node: The patient was brought to the [place] suite. Identity was confirmed. All relevant records and images related to the planned course of therapy were reviewed. The patient freely provided informed written consent to proceed with treatment after reviewing the details related to the planned course of therapy. The consent form was witnessed and verified by the simulation staff. Then, the patient was set-up in a stable reproducible supine position for radiation therapy. Pelvic exam revealed the vaginal cuff to be intact. The patient's custom vaginal cylinder was placed in the proximal vagina. This was affixed to the CT/MR stabilization plate to prevent slippage. Patient tolerated the placement well.  Verification simulation note:  A fiducial marker was placed within the vaginal cylinder. An AP and lateral film was then obtained through the pelvis area. This documented accurate position of the vaginal cylinder for treatment.  HDR BRACHYTHERAPY TREATMENT  The remote afterloading device was affixed to the vaginal cylinder by catheter. Patient then proceeded to undergo her fifth high-dose-rate treatment directed at the proximal vagina. The patient was prescribed a dose of 6.0 gray to be delivered to the mucosal surface. Treatment length was 3.0 cm. Patient was treated with 1 channel using 7 dwell positions. Treatment time was 393.6 seconds. Iridium 192 was the high-dose-rate source for  treatment. The patient tolerated the treatment well. After completion of her therapy, a radiation survey was performed documenting return of the iridium source into the GammaMed safe.   PLAN: The patient has completed radiation therapy. She will follow up in one month.  -----------------------------------  Blair Promise, PhD, MD  This document serves as a record of services personally performed by Gery Pray, MD. It was created on his behalf by Wilburn Mylar, a trained medical scribe. The creation of this record is based on the scribe's personal observations and the provider's statements to them. This document has been checked and approved by the attending provider.

## 2018-09-07 NOTE — Progress Notes (Signed)
  Radiation Oncology         [phone] ________________________________  Name: Andrea Hoffman MRN: [id number]  Date: 09/07/2018  DOB: 03-Apr-1946  End of Treatment Note  Diagnosis:   Stage IB, Grade IEndometrial Adenocarcinoma     Indication for treatment:  Curative       Radiation treatment dates:   08/17/18 - 09/07/18  Site/dose:   Vaginal Cuff, 3 cm cylinder with treatment length of 3 cm/ 30 Gy delivered in 5 fractions of 6 Gy  Beams/energy:   Brachytherapy/ HDR, Iridium-192  Narrative: The patient tolerated radiation treatment relatively well.     Plan: The patient has completed radiation treatment. The patient will return to radiation oncology clinic for routine followup in one month. I advised them to call or return sooner if they have any questions or concerns related to their recovery or treatment.  -----------------------------------  Blair Promise, PhD, MD  This document serves as a record of services personally performed by Gery Pray, MD. It was created on his behalf by Wilburn Mylar, a trained medical scribe. The creation of this record is based on the scribe's personal observations and the provider's statements to them. This document has been checked and approved by the attending provider.

## 2018-09-07 NOTE — Progress Notes (Signed)
REFERRING PROVIDER: [name], MD [address] [place], [place] [address]  PRIMARY PROVIDER:  [name], MD  PRIMARY REASON FOR VISIT:  1. 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 Family history of breast cancer   3. Family history of colon cancer   4. Family history of colonic polyps   5. Endometrial adenocarcinoma ([place])   6. History of colon cancer      HISTORY OF PRESENT ILLNESS:   Andrea Hoffman, a 72 y.o. female, was seen for a [place] cancer genetics consultation at the request of Dr. Gerarda Fraction due to a personal and family history of cancer.  Andrea Hoffman presents to clinic today to discuss the possibility of a hereditary predisposition to cancer, genetic testing, and to further clarify her future cancer risks, as well as potential cancer risks for family members.   In 1999, at the age of 29, Andrea Hoffman was diagnosed with colon cancer. This was treated with surgery and chemotherapy.  In 2019, at the age of 53, Andrea Hoffman was diagnosed with uterine cancer and a granulosa cell tumor on her ovary.  This was treated with surgery and radiation.   Tumor testing on the uterine cancer was not suggestive of Lynch syndrome, showing MSI-S and no loss of IHC.     CANCER HISTORY:   No history exists.     HORMONAL RISK FACTORS:  Menarche was at age 52.  First live birth at age 13.  OCP use for approximately 0 years.  Ovaries intact: no.  Hysterectomy: yes.  Menopausal status: postmenopausal.  HRT use: 0 years. Colonoscopy: yes; <5 polyps. Mammogram within the last year: yes. Number of breast biopsies: 2. Up to date with pelvic exams:  yes. Any excessive radiation exposure in the past:  yes  Past Medical History:  Diagnosis Date  . Anemia   . Broken arm    Right shoulder  . Bunion   . Colon cancer ([place])    surgery followed by chemo for "6 months"  . Cyanocobalamin deficiency   . Depression   . Diabetes mellitus without complication ([place])   . Diabetic  neuropathy ([place])   . Dyspnea    with exertion  . Endometrial cancer ([place])   . Family history of breast cancer   . Family history of colon cancer   . Family history of colonic polyps   . Hammer toe   . High cholesterol   . History of pneumonia   . Hypertension   . Migraines    history of  . Pulmonary nodules    Numerous small  . Rheumatic fever    age 15    Past Surgical History:  Procedure Laterality Date  . AMPUTATION TOE  2014   2 toes amputated  . COLONOSCOPY    . LAPAROTOMY WITH STAGING N/A 07/01/2018   Procedure: POSSIBLE LAPAROTOMY WITH STAGING;  Surgeon: [name], MD;  Location: [facility];  Service: Gynecology;  Laterality: N/A;  . PARTIAL COLECTOMY  2002 or 2003   Colon cancer [facility] - "took 18-22 inches"  . ROBOTIC ASSISTED TOTAL HYSTERECTOMY WITH BILATERAL SALPINGO OOPHERECTOMY Bilateral 07/01/2018   Procedure: XI ROBOTIC ASSISTED TOTAL HYSTERECTOMY WITH BILATERAL SALPINGO OOPHORECTOMY;  Surgeon: [name], MD;  Location: [facility];  Service: Gynecology;  Laterality: Bilateral;  . SENTINEL NODE BIOPSY N/A 07/01/2018   Procedure: SENTINEL NODE BIOPSY;  Surgeon: [name], MD;  Location: [facility];  Service: Gynecology;  Laterality: N/A;  .  TONSILLECTOMY     Patient had her tonsils taken out at age 29  . TUBAL LIGATION  1974  . UPPER GI ENDOSCOPY      Social History   Socioeconomic History  . Marital status: Married    Spouse name: Not on file  . Number of children: Not on file  . Years of education: Not on file  . Highest education level: Not on file  Occupational History  . Not on file  Social Needs  . Financial resource strain: Not on file  . Food insecurity:    Worry: Not on file    Inability: Not on file  . Transportation needs:    Medical: Not on file    Non-medical: Not on file  Tobacco Use  . Smoking status: Never Smoker  . Smokeless tobacco: Never Used  Substance and Sexual Activity  . Alcohol use: Never    Frequency:  Never  . Drug use: Never  . Sexual activity: Not Currently    Birth control/protection: Post-menopausal  Lifestyle  . Physical activity:    Days per week: Not on file    Minutes per session: Not on file  . Stress: Not on file  Relationships  . Social connections:    Talks on phone: Not on file    Gets together: Not on file    Attends religious service: Not on file    Active member of club or organization: Not on file    Attends meetings of clubs or organizations: Not on file    Relationship status: Not on file  Other Topics Concern  . Not on file  Social History Narrative  . Not on file     FAMILY HISTORY:  We obtained a detailed, 4-generation family history.  Significant diagnoses are listed below: Family History  Problem Relation Age of Onset  . Heart attack Father        d. 21  . Diabetes Brother   . Colon polyps Brother   . Diabetes Maternal Grandmother        d. 36  . Lung cancer Mother   . Breast cancer Mother 2       ? bony mets; d. 57  . Breast cancer Maternal Aunt        dx over 15  . Colon cancer Maternal Uncle        dx over 69  . Breast cancer Maternal Aunt        dx over 28  . Cancer Cousin        mat first cousin; unknown type in their 48's  . Cancer Cousin        mat first cousin; Lung in their 35's  . Colon cancer Maternal Uncle        dx over 49  . COPD Maternal Grandfather   . Other Paternal Grandmother        childbirth bed fever, d. 23  . Breast cancer Cousin        pat first cousin d. 18s    The patient has three daughters and a son who are all cancer free.  One daughter has a daughter who had a benign breast tumor and a benign ovarian tumor at age 32.  She has two brothers. One brother has colon polyps.  Both parents are deceased.  The patient's mother had breast cancer at 36.  She had three sisters, eight brothers and three paternal half brothers.  Two full brothers had colon cancer over 2  and two sisters had breast cancer over 65's.   One of the brothers had two children with unknown cancers.  Both grandparents are deceased.  The grandmother died in her 71's and the grandfather died in his 1's-90's.  The patient's father had two brothers, two sisters and two paternal half brothers who were all cancer free. His mother died of childbirth bed fever, and the grandfather died at 50.  Andrea Hoffman is unaware of previous family history of genetic testing for hereditary cancer risks. Patient's maternal ancestors are of Caucasian and native American descent, and paternal ancestors are of English descent. There is no reported Ashkenazi Jewish ancestry. There is no known consanguinity.  GENETIC COUNSELING ASSESSMENT: Andrea Hoffman is a 72 y.o. female with a personal and family history of cancer which is somewhat suggestive of a hereditary cancer syndrome, such as Lynch syndrome and predisposition to cancer. We, therefore, discussed and recommended the following at today's visit.   DISCUSSION: We discussed that about 3% of uterine cancer and 5-7% of colon cancer is due to hereditary causes, most commonly Lynch syndrome.  There are other genes associated with hereditary colon cancer syndromes, as well has hereditary breast cancer syndromes.  We reviewed the characteristics, features and inheritance patterns of hereditary cancer syndromes. We also discussed genetic testing, including the appropriate family members to test, the process of testing, insurance coverage and turn-around-time for results. We discussed the implications of a negative, positive and/or variant of uncertain significant result. We recommended Andrea Hoffman pursue genetic testing for the multi cancer gene panel. The Multi-Gene Panel offered by [facility] includes sequencing and/or deletion duplication testing of the following 84 genes: AIP, ALK, APC, ATM, AXIN2,BAP1,  BARD1, BLM, BMPR1A, BRCA1, BRCA2, BRIP1, CASR, CDC73, CDH1, CDK4, CDKN1B, CDKN1C, CDKN2A (p14ARF), CDKN2A (p16INK4a),  CEBPA, CHEK2, CTNNA1, DICER1, DIS3L2, EGFR (c.2369C>T, p.Thr790Met variant only), EPCAM (Deletion/duplication testing only), FH, FLCN, GATA2, GPC3, GREM1 (Promoter region deletion/duplication testing only), HOXB13 (c.251G>A, p.Gly84Glu), HRAS, KIT, MAX, MEN1, MET, MITF (c.952G>A, p.Glu318Lys variant only), MLH1, MSH2, MSH3, MSH6, MUTYH, NBN, NF1, NF2, NTHL1, PALB2, PDGFRA, PHOX2B, PMS2, POLD1, POLE, POT1, PRKAR1A, PTCH1, PTEN, RAD50, RAD51C, RAD51D, RB1, RECQL4, RET, RUNX1, SDHAF2, SDHA (sequence changes only), SDHB, SDHC, SDHD, SMAD4, SMARCA4, SMARCB1, SMARCE1, STK11, SUFU, TERC, TERT, TMEM127, TP53, TSC1, TSC2, VHL, WRN and WT1.    Based on Ms. Zogg's personal and family history of cancer, she meets medical criteria for genetic testing. Despite that she meets criteria, she may still have an out of pocket cost. We discussed that if her out of pocket cost for testing is over $100, the laboratory will call and confirm whether she wants to proceed with testing.  If the out of pocket cost of testing is less than $100 she will be billed by the genetic testing laboratory.   PLAN: After considering the risks, benefits, and limitations, Andrea Hoffman  provided informed consent to pursue genetic testing and the blood sample was sent to [facility] for analysis of the Multi-cancer panel. Results should be available within approximately 2-3 weeks' time, at which point they will be disclosed by telephone to Andrea Hoffman, as will any additional recommendations warranted by these results. Andrea Hoffman will receive a summary of her genetic counseling visit and a copy of her results once available. This information will also be available in Epic. We encouraged Andrea Hoffman to remain in contact with cancer genetics annually so that we can continuously update the family history and inform her of any changes in cancer genetics  and testing that may be of benefit for her family. Andrea Hoffman questions were answered to her  satisfaction today. Our contact information was provided should additional questions or concerns arise.  Lastly, we encouraged Andrea Hoffman to remain in contact with cancer genetics annually so that we can continuously update the family history and inform her of any changes in cancer genetics and testing that may be of benefit for this family.   Ms.  Hoffman questions were answered to her satisfaction today. Our contact information was provided should additional questions or concerns arise. Thank you for the referral and allowing [place] to share in the care of your patient.   Karen P. Florene Hoffman, [place], [facility] Certified Genetic Counselor [email or web address] phone: [phone]  The patient was seen for a total of 45 minutes in face-to-face genetic counseling.  This patient was discussed with Drs. Magrinat, Lindi Adie and/or Burr Medico who agrees with the above.    _______________________________________________________________________ For Office Staff:  Number of people involved in session: 1 Was an Intern/ student involved with case: no

## 2018-09-14 ENCOUNTER — Ambulatory Visit: Payer: Medicare HMO | Admitting: Radiation Oncology

## 2018-09-21 ENCOUNTER — Ambulatory Visit: Payer: Medicare HMO | Admitting: Radiation Oncology

## 2018-09-25 ENCOUNTER — Telehealth: Payer: Self-pay | Admitting: Genetic Counselor

## 2018-09-25 ENCOUNTER — Encounter: Payer: Self-pay | Admitting: Genetic Counselor

## 2018-09-25 ENCOUNTER — Ambulatory Visit: Payer: Self-pay | Admitting: Genetic Counselor

## 2018-09-25 DIAGNOSIS — Z1379 Encounter for other screening for genetic and chromosomal anomalies: Secondary | ICD-10-CM

## 2018-09-25 NOTE — Progress Notes (Addendum)
HPI:  Ms. Nunziata was previously seen in the [place] [facility] due to a personal and family history of cancer and concerns regarding a hereditary predisposition to cancer. Please refer to our prior cancer genetics clinic note for more information regarding Ms. Auble's medical, social and family histories, and our assessment and recommendations, at the time. Ms. Cranfield recent genetic test results were disclosed to her, as were recommendations warranted by these results. These results and recommendations are discussed in more detail below.  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  The Multi-Gene Panel offered by [facility] includes sequencing and/or deletion duplication testing of the following 84 genes: AIP, ALK, APC, ATM, AXIN2,BAP1,  BARD1, BLM, BMPR1A, BRCA1, BRCA2, BRIP1, CASR, CDC73, CDH1, CDK4, CDKN1B, CDKN1C, CDKN2A (p14ARF), CDKN2A (p16INK4a), CEBPA, CHEK2, CTNNA1, DICER1, DIS3L2, EGFR (c.2369C>T, p.Thr790Met variant only), EPCAM (Deletion/duplication testing only), FH, FLCN, GATA2, GPC3, GREM1 (Promoter region deletion/duplication testing only), HOXB13 (c.251G>A, p.Gly84Glu), HRAS, KIT, MAX, MEN1, MET, MITF (c.952G>A, p.Glu318Lys variant only), MLH1, MSH2, MSH3, MSH6, MUTYH, NBN, NF1, NF2, NTHL1, PALB2, PDGFRA, PHOX2B, PMS2, POLD1, POLE, POT1, PRKAR1A, PTCH1, PTEN, RAD50, RAD51C, RAD51D, RB1, RECQL4, RET, RUNX1, SDHAF2, SDHA (sequence changes only), SDHB, SDHC, SDHD, SMAD4, SMARCA4, SMARCB1, SMARCE1, STK11, SUFU, TERC, TERT, TMEM127, TP53, TSC1, TSC2, VHL, WRN and WT1.  The report date is September 17, 2018.     Endometrial adenocarcinoma ([place])   06/10/2018 Initial Diagnosis    Endometrial adenocarcinoma ([place])    09/17/2018 Genetic Testing    CASR c.2301G>A (Silent) VUS  identified on the Multi-cancer panel.  The Multi-Gene Panel offered by [facility] includes sequencing and/or deletion duplication testing of the following 84 genes: AIP, ALK, APC, ATM, AXIN2,BAP1,  BARD1, BLM, BMPR1A, BRCA1, BRCA2, BRIP1, CASR, CDC73, CDH1, CDK4, CDKN1B, CDKN1C, CDKN2A (p14ARF), CDKN2A (p16INK4a), CEBPA, CHEK2, CTNNA1, DICER1, DIS3L2, EGFR (c.2369C>T, p.Thr790Met variant only), EPCAM (Deletion/duplication testing only), FH, FLCN, GATA2, GPC3, GREM1 (Promoter region deletion/duplication testing only), HOXB13 (c.251G>A, p.Gly84Glu), HRAS, KIT, MAX, MEN1, MET, MITF (c.952G>A, p.Glu318Lys variant only), MLH1, MSH2, MSH3, MSH6, MUTYH, NBN, NF1, NF2, NTHL1, PALB2, PDGFRA, PHOX2B, PMS2, POLD1, POLE, POT1, PRKAR1A, PTCH1, PTEN, RAD50, RAD51C, RAD51D, RB1, RECQL4, RET, RUNX1, SDHAF2, SDHA (sequence changes only), SDHB, SDHC, SDHD, SMAD4, SMARCA4, SMARCB1, SMARCE1, STK11, SUFU, TERC, TERT, TMEM127, TP53, TSC1, TSC2, VHL, WRN and WT1.  The report date is September 17, 2018.     FAMILY HISTORY:  We obtained a detailed, 4-generation family history.  Significant diagnoses are listed below: Family History  Problem Relation Age of Onset   Heart attack Father        d. 89   Diabetes Brother    Colon polyps Brother    Diabetes Maternal Grandmother        d. 91   Lung cancer Mother    Breast cancer Mother 89       ? bony mets; d. 16   Breast cancer Maternal Aunt        dx over 43   Colon cancer Maternal Uncle        dx over 30   Breast cancer Maternal Aunt        dx over 56   Cancer Cousin        mat first cousin; unknown type in their 20's   Cancer Cousin  mat first cousin; Lung in their 95's   Colon cancer Maternal Uncle        dx over 103   COPD Maternal Grandfather    Other Paternal Grandmother        childbirth bed fever, d. 61   Breast cancer Cousin        pat first cousin d. 19s    The patient has three daughters and a son who are all cancer free.  One daughter has a daughter  who had a benign breast tumor and a benign ovarian tumor at age 48.  She has two brothers. One brother has colon polyps.  Both parents are deceased.   The patient's mother had breast cancer at 56.  She had three sisters, eight brothers and three paternal half brothers.  Two full brothers had colon cancer over 90 and two sisters had breast cancer over 11's.  One of the brothers had two children with unknown cancers.  Both grandparents are deceased.  The grandmother died in her 83's and the grandfather died in his 73's-90's.   The patient's father had two brothers, two sisters and two paternal half brothers who were all cancer free. His mother died of childbirth bed fever, and the grandfather died at 59.   Ms. Nutter is unaware of previous family history of genetic testing for hereditary cancer risks. Patient's maternal ancestors are of Caucasian and native American descent, and paternal ancestors are of English descent. There is no reported Ashkenazi Jewish ancestry. There is no known consanguinity.  GENETIC TEST RESULTS: Genetic testing reported out on September 17, 2018 through the multi-cancer panel found no deleterious mutations. The Multi-Gene Panel offered by [facility] includes sequencing and/or deletion duplication testing of the following 84 genes: AIP, ALK, APC, ATM, AXIN2,BAP1,  BARD1, BLM, BMPR1A, BRCA1, BRCA2, BRIP1, CASR, CDC73, CDH1, CDK4, CDKN1B, CDKN1C, CDKN2A (p14ARF), CDKN2A (p16INK4a), CEBPA, CHEK2, CTNNA1, DICER1, DIS3L2, EGFR (c.2369C>T, p.Thr790Met variant only), EPCAM (Deletion/duplication testing only), FH, FLCN, GATA2, GPC3, GREM1 (Promoter region deletion/duplication testing only), HOXB13 (c.251G>A, p.Gly84Glu), HRAS, KIT, MAX, MEN1, MET, MITF (c.952G>A, p.Glu318Lys variant only), MLH1, MSH2, MSH3, MSH6, MUTYH, NBN, NF1, NF2, NTHL1, PALB2, PDGFRA, PHOX2B, PMS2, POLD1, POLE, POT1, PRKAR1A, PTCH1, PTEN, RAD50, RAD51C, RAD51D, RB1, RECQL4, RET, RUNX1, SDHAF2, SDHA (sequence changes only),  SDHB, SDHC, SDHD, SMAD4, SMARCA4, SMARCB1, SMARCE1, STK11, SUFU, TERC, TERT, TMEM127, TP53, TSC1, TSC2, VHL, WRN and WT1. The test report has been scanned into EPIC and is located under the Molecular Pathology section of the Results Review tab.    We discussed with Ms. Wenk that since the current genetic testing is not perfect, it is possible there may be a gene mutation in one of these genes that current testing cannot detect, but that chance is small.  We also discussed, that it is possible that another gene that has not yet been discovered, or that we have not yet tested, is responsible for the cancer diagnoses in the family, and it is, therefore, important to remain in touch with cancer genetics in the future so that we can continue to offer Ms. Holzmann the most up to date genetic testing.   Genetic testing did detect a Variant of Unknown Significance in the CASR gene called c.2301G>A (Silent).  At this time, it is unknown if this variant is associated with increased cancer risk or if this is a normal finding, but most variants such as this get reclassified to being inconsequential. It should not be used to make medical management decisions. With time,  we suspect the lab will determine the significance of this variant, if any. If we do learn more about it, we will try to contact Ms. Whittaker to discuss it further. However, it is important to stay in touch with [place] periodically and keep the address and phone number up to date.  UPDATE: CASR c.2301G>A (Silent) VUS has been reclassified as Likely Benign.  The amended report date is June 18, 2021.   CANCER SCREENING RECOMMENDATIONS: This result is reassuring and indicates that Ms. Coletta likely does not have an increased risk for a future cancer due to a mutation in one of these genes. This normal test also suggests that Ms. Catalina's cancer was most likely not due to an inherited predisposition associated with one of these genes.  Most cancers happen by  chance and this negative test suggests that her cancer falls into this category.  We, therefore, recommended she continue to follow the cancer management and screening guidelines provided by her oncology and primary healthcare provider.   An individuals cancer risk and medical management are not determined by genetic test results alone. Overall cancer risk assessment incorporates additional factors, including personal medical history, family history, and any available genetic information that may result in a personalized plan for cancer prevention and surveillance.  RECOMMENDATIONS FOR FAMILY MEMBERS:  Women in this family might be at some increased risk of developing cancer, over the general population risk, simply due to the family history of cancer.  We recommended women in this family have a yearly mammogram beginning at age 89, or 61 years younger than the earliest onset of cancer, an annual clinical breast exam, and perform monthly breast self-exams. Women in this family should also have a gynecological exam as recommended by their primary provider. All family members should have a colonoscopy by age 53.  FOLLOW-UP: Lastly, we discussed with Ms. Vanevery that cancer genetics is a rapidly advancing field and it is possible that new genetic tests will be appropriate for her and/or her family members in the future. We encouraged her to remain in contact with cancer genetics on an annual basis so we can update her personal and family histories and let her know of advances in cancer genetics that may benefit this family.   Our contact number was provided. Ms. Walthall questions were answered to her satisfaction, and she knows she is welcome to call us at anytime with additional questions or concerns.   Roma Kayser, MS, [facility] Certified Genetic Counselor [email or web address]

## 2018-09-25 NOTE — Telephone Encounter (Signed)
Revealed negative genetic testing.  Discussed that we do not know why she has had cancer or why there is cancer in the family. It could be due to a different gene that we are not testing, or maybe our current technology may not be able to pick something up.  It will be important for her to keep in contact with genetics to keep up with whether additional testing may be needed.  Revealed VUS in CASR. This is still a normal test result.

## 2018-09-28 ENCOUNTER — Ambulatory Visit: Payer: Medicare HMO | Admitting: Radiation Oncology

## 2018-10-05 DIAGNOSIS — Z01818 Encounter for other preprocedural examination: Secondary | ICD-10-CM | POA: Diagnosis not present

## 2018-10-08 ENCOUNTER — Ambulatory Visit
Admission: RE | Admit: 2018-10-08 | Discharge: 2018-10-08 | Disposition: A | Discharge status: Home / Self Care | Payer: Medicare HMO | Source: Ambulatory Visit | Attending: Radiation Oncology | Admitting: Radiation Oncology

## 2018-10-08 ENCOUNTER — Encounter: Payer: Self-pay | Admitting: Radiation Oncology

## 2018-10-08 ENCOUNTER — Other Ambulatory Visit: Payer: Self-pay

## 2018-10-08 VITALS — BP 136/69 | HR 98 | Temp 97.9°F | Resp 18 | Ht 65.0 in | Wt 212.1 lb

## 2018-10-08 DIAGNOSIS — Z7984 Long term (current) use of oral hypoglycemic drugs: Secondary | ICD-10-CM | POA: Insufficient documentation

## 2018-10-08 DIAGNOSIS — C541 Malignant neoplasm of endometrium: Secondary | ICD-10-CM

## 2018-10-08 DIAGNOSIS — R300 Dysuria: Secondary | ICD-10-CM

## 2018-10-08 DIAGNOSIS — Z79899 Other long term (current) drug therapy: Secondary | ICD-10-CM | POA: Diagnosis not present

## 2018-10-08 DIAGNOSIS — Z885 Allergy status to narcotic agent status: Secondary | ICD-10-CM | POA: Insufficient documentation

## 2018-10-08 LAB — URINALYSIS, COMPLETE (UACMP) WITH MICROSCOPIC
Bilirubin Urine: NEGATIVE
Glucose, UA: 50 mg/dL — AB
Hgb urine dipstick: NEGATIVE
KETONES UR: NEGATIVE mg/dL
Nitrite: NEGATIVE
PH: 5 (ref 5.0–8.0)
Protein, ur: NEGATIVE mg/dL
SPECIFIC GRAVITY, URINE: 1.015 (ref 1.005–1.030)

## 2018-10-08 NOTE — Progress Notes (Signed)
Radiation Oncology         [phone] ________________________________  Name: Andrea Hoffman MRN: [id number]  Date: 10/08/2018  DOB: 1945/12/30  Follow-Up Visit Note  CC: Andrea Hoffman., MD  Andrea Hoffman., MD    ICD-10-CM   1. Endometrial adenocarcinoma (HCC) C54.1     Diagnosis:   Stage IB, Grade IEndometrial Adenocarcinoma  Interval Since Last Radiation:  1 month 08/17/18 - 09/07/18: Brachytherapy/ HDR, Ir-192/ Vaginal Cuff, 3 cm cylinder with treatment length of 3 cm/ 30 Gy delivered in 5 fractions of 6 Gy  Narrative:  The patient returns today for routine follow-up. She reports her fatigue is improving. She reports urine odor, and denies hematuria or dysuria, rectal bleeding, and bowel issues. She also denies vaginal itching, bleeding, or discharge. She notes a tenderness to her inner lips.  She has also been followed by Dr. Gerarda Hoffman and her doctor in [facility] regarding a left breast mass. 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 Lilia Hoffman.   She states she and her family are planning a trip to [facility] on Thanksgiving week.                        ALLERGIES:  is allergic to hydrocodone.  Meds: Current Outpatient Medications  Medication Sig Dispense Refill  . allopurinol (ZYLOPRIM) 100 MG tablet Take 100 mg by mouth daily.    . Alpha-Lipoic Acid 200 MG CAPS Take 200-400 mg by mouth See admin instructions. Take 200 mg by mouth in the morning and take 400 mg by mouth at bedtime    . amLODipine (NORVASC) 10 MG tablet Take 10 mg by mouth daily.     . carvedilol (COREG) 6.25 MG tablet Take 6.25 mg by mouth 2 (two) times daily with a meal.     . citalopram (CELEXA) 40 MG tablet Take 0.5 tablets (20 mg total) by mouth at bedtime. Take 20 mg once daily    . glucose blood (ONE TOUCH ULTRA TEST) test strip     . hydrochlorothiazide (HYDRODIURIL) 25 MG tablet Take 25 mg by mouth daily.     Marland Kitchen lisinopril (PRINIVIL,ZESTRIL) 20  MG tablet Take 20 mg by mouth daily.     . metFORMIN (GLUCOPHAGE) 500 MG tablet Take 1 tablet (500 mg total) by mouth 2 (two) times daily with a meal. Take one tablet (500 mg total) in the am and 500 mg in the pm until seen in the office for repeat creatinine level.    . nitroGLYCERIN (NITROSTAT) 0.4 MG SL tablet Place 0.4 mg under the tongue every 5 (five) minutes as needed for chest pain.     [name] DELICA LANCETS 55D MISC     . simvastatin (ZOCOR) 40 MG tablet Take 0.5 tablets (20 mg total) by mouth at bedtime. Until you see your primary care doctor    . ONETOUCH VERIO test strip USE STRIP TO CHECK GLUCOSE AS DIRECTED ONCE DAILY IN THE MORNING  99   No current facility-administered medications for this encounter.     Physical Findings: The patient is in no acute distress. Patient is alert and oriented.  height is 5\' 5"  (1.651 m) and weight is 212 lb 2 oz (96.2 kg). Her oral temperature is 97.9 F (36.6 C). Her blood pressure is 136/69 and her pulse is 98. Her respiration is 18 and oxygen saturation is 96%.  Lungs are clear to auscultation  bilaterally. Heart has regular rate and rhythm. No palpable cervical, supraclavicular, or axillary adenopathy. Abdomen soft, non-tender, normal bowel sounds. Pelvic exam deferred in light of recent treatment completion.   Lab Findings: Lab Results  Component Value Date   WBC 9.2 07/02/2018   HGB 8.9 (L) 07/02/2018   HCT 30.4 (L) 07/02/2018   MCV 72.8 (L) 07/02/2018   PLT 275 07/02/2018    Radiographic Findings: No results found.  Impression:  Stage IB, Grade I Endometrial Adenocarcinoma The patient is recovering from the effects of radiation. Clinically stable. Vaginal dilator and education given today. Patient notes a strong urinary odor. We will send her to labs for urine analysis, culture, and sensitivity.  Plan:  Follow up with radiation oncology in March. Patient will follow up with Dr. Gerarda Hoffman in  December.  -----------------------------------  Blair Promise, PhD, MD   This document serves as a record of services personally performed by Gery Pray, MD. It was created on his behalf by Wilburn Mylar, a trained medical scribe. The creation of this record is based on the scribe's personal observations and the provider's statements to them. This document has been checked and approved by the attending provider.

## 2018-10-08 NOTE — Progress Notes (Signed)
  Home Care Instructions for the Insertion and Care of Your Vaginal Dilator  Why Do I Need a Vaginal Dilator?  Internal radiation therapy may cause scar tissue to form at the top of your vagina (vaginal cuff).  This may make vaginal examinations difficult in the future. You can prevent scar tissue from forming by using a vaginal dilator (a smooth plastic rod), and/or by having regular sexual intercourse.  If not using the dilator you should be having intercourse two or three times a week.  If you are unable to have intercourse, you should use your vaginal dilator.  You may have some spotting or bleeding from your dilator or intercourse the first few times. You may also have some discomfort. If discomfort occurs with intercourse, you and your partner may need to stop for a while and try again later.  How to Use Your Vaginal Dilator  - Wash the dilator with soap and water before and after each use. - Check the dilator to be sure it is smooth. Do not use the dilator if you find any roughspots. - Coat the dilator with K-Y Jelly, Astroglide, or Replens. Do not use Vaseline, baby oil, or other oil based lubricants. They are not water-soluble and can be irritating to the tissues in the vagina. - Lie on your back with your knees bent and legs apart. - Insert the rounded end of the dilator into your vagina as far as it will go without causing pain or discomfort. - Close your knees and slowly straighten your legs. - Keep the dilator in your vagina for about 10 to 15 minutes.  Please use 3 times a week, for example: Monday, Wednesday and Friday evenings. - Bend your knees, open your legs, and gently remove the dilator. - Gently cleanse the skin around the vaginal opening. - Wash the dilator after each use. -  It is important that you use the dilator routinely until instructed otherwise by your doctor.   Marisela Line W Peyten Weare, RN BSN   

## 2018-10-08 NOTE — Progress Notes (Signed)
Pt presents today for f/u with Dr. Sondra Come. Pt is unaccompanied. Pt reports PAC on RIGHT chest was removed and breast mass was removed on LEFT Monday, 10/05/18, in [place] by Dr. Lilia Pro. Pt reports pain in bilateral chest rated 2/10. Pt denies dysuria/hematuria. Pt reports vaginal odor. Pt denies vaginal itching, bleeding, or discharge. Pt reports bowel habits are per usual since colon CA. Pt denies rectal bleeding.   Vaginal dilator teaching given with good understanding.   BP 136/69 (BP Location: Left Arm, Patient Position: Sitting)   Pulse 98   Temp 97.9 F (36.6 C) (Oral)   Resp 18   Ht 5\' 5"  (1.651 m)   Wt 212 lb 2 oz (96.2 kg)   SpO2 96%   BMI 35.30 kg/m   Wt Readings from Last 3 Encounters:  10/08/18 212 lb 2 oz (96.2 kg)  08/17/18 215 lb (97.5 kg)  08/10/18 210 lb (95.3 kg)   Loma Sousa, RN BSN

## 2018-10-09 LAB — URINE CULTURE: Culture: NO GROWTH

## 2018-11-11 ENCOUNTER — Encounter: Payer: Self-pay | Admitting: Obstetrics

## 2018-11-11 ENCOUNTER — Inpatient Hospital Stay (HOSPITAL_BASED_OUTPATIENT_CLINIC_OR_DEPARTMENT_OTHER): Payer: Medicare HMO | Admitting: Obstetrics

## 2018-11-11 ENCOUNTER — Inpatient Hospital Stay: Payer: Medicare HMO | Attending: Obstetrics

## 2018-11-11 VITALS — BP 130/63 | HR 90 | Temp 98.2°F | Resp 20 | Ht 65.0 in | Wt 216.0 lb

## 2018-11-11 DIAGNOSIS — C562 Malignant neoplasm of left ovary: Secondary | ICD-10-CM | POA: Insufficient documentation

## 2018-11-11 DIAGNOSIS — C541 Malignant neoplasm of endometrium: Secondary | ICD-10-CM | POA: Insufficient documentation

## 2018-11-11 DIAGNOSIS — Z90710 Acquired absence of both cervix and uterus: Secondary | ICD-10-CM

## 2018-11-11 DIAGNOSIS — R970 Elevated carcinoembryonic antigen [CEA]: Secondary | ICD-10-CM | POA: Diagnosis not present

## 2018-11-11 DIAGNOSIS — Z90722 Acquired absence of ovaries, bilateral: Secondary | ICD-10-CM | POA: Diagnosis not present

## 2018-11-11 DIAGNOSIS — Z923 Personal history of irradiation: Secondary | ICD-10-CM | POA: Insufficient documentation

## 2018-11-11 DIAGNOSIS — C561 Malignant neoplasm of right ovary: Secondary | ICD-10-CM

## 2018-11-11 DIAGNOSIS — Z85038 Personal history of other malignant neoplasm of large intestine: Secondary | ICD-10-CM

## 2018-11-11 NOTE — Progress Notes (Addendum)
Progress Note: Established Patient Surveillance Visit   Consult was originally requested by Dr. Ashby Dawes  Chief Complaint  Patient presents with  . Endometrial adenocarcinoma [facility])   Gyn Oncologic Summary 1. 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 HIR Stage IB, Grade 1 Endometrioid Endometrial Adenocarcinoma   07/01/18 Robotic-assisted TLH/BSO pelvic lymphadenectomy and SLN biopsy, Pelvic washings, Omentectomy   08/17/18 - 09/07/18 Vaginal brachytherapy (Dr. Sondra Come) - 5 Fxn 3. Genetic testing negative (VUS in CASR)  HPI: [name]  is a very nice 72 y.o.  P4  . Interval History Since her last visit she has had breast workup negative, genetic testing negative (one VUS), and finished up her vaginal brachytherapy.  She notes some episodes of depression with some crying. No suicidal/homicidal thoughts. Her PCP has her on Celexa.  She presents for surveillance today. No bleeding, no pelvic pain. She asks me if she needs to use the dilator.  . Oncologic Course  She noted postmenopausal bleeding described as spotting a few months prior to presentation. Noted some cramps, otherwise no significant symptoms. She called her PCP to report the bleeding and then was referred to Dr. Octaviano Glow for work-up. A Pap smear was done revealing AGUS with HRHPV negative. A cervical polyp was removed and found to be benign.  Followup TVUS showing thickened EM lining and bilateral adnexal masses. Endometrial biopsy  (read by [facility]) revealed grade 1 endometrial adenocarcinoma.   CA125 elevated for the lab at 30.7 (lab normal <21) CEA mildly elevated at 3.3 (lab normal <3 for nonsmokers)  Denies nausea, vomiting, change in appetite. Has lost about 7 pounds in 6 months. Some pelvic cramping.  Has history of colon cancer operated on in [facility]. States had normal colonoscopy 2018.  She underwent on 07/01/18 Robotic-assisted TLH/BSO pelvic lymphadenectomy and SLN  biopsy, Pelvic washings, Omentectomy (partial) Findings included ; Adhesions of omentum to anterior abdominal wall. Normal sized uterus. Normal appearing left ovary. Right ovary enlarged and firm; bilobed. Frozen on uterus >50% depth of invasion. 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 Sentinel uptake left and right external iliac vessels, both proximal and distal, so 4 total sentinel lymph nodes. Mild adenopathy in right proximal external node. No gross extrapelvic disease  There were no perioperative complications.   Final pathology revealed: Uterus, cervix and bilateral fallopian tubes, left ovary - ENDOMETRIOID ADENOCARCINOMA, 4.1 CM, FIGO GRADE 1. - CARCINOMA INVADES FOR A DEPTH OF 1.3 CM WHERE MYOMETRIAL THICKNESS IS 2.0 CM (GREATER THAN 50%). - NEGATIVE FOR LYMPHOVASCULAR OR PERINEURAL INVASION. - BENIGN LEIOMYOMATA. - BENIGN CERVICAL POLYP. - BENIGN UNREMARKABLE BILATERAL FALLOPIAN TUBES AND LEFT OVARY. - SEE ONCOLOGY TABLE. - SEE NOTE. 2. 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 - OVARIAN SURFACE IS NOT INVOLVED BY TUMOR. - NO SIGNIFICANT MITOTIC ACTIVITY OR NECROSIS. - SEE ONCOLOGY TABLE. - SEE NOTE. 3. Lymph node, sentinel, biopsy, right external iliac - LYMPH NODE, NEGATIVE FOR CARCINOMA (0/1). - IMMUNOHISTOCHEMICAL STAIN FOR PANKERATIN (AE1/AE3) IS NEGATIVE 4. Lymph node, sentinel, biopsy, right distal external iliac - LYMPH NODE, NEGATIVE FOR CARCINOMA (0/1). - IMMUNOHISTOCHEMICAL STAIN FOR PANKERATIN (AE1/AE3) IS NEGATIVE 5. Lymph node, sentinel, biopsy, left external iliac - LYMPH NODE, NEGATIVE FOR CARCINOMA (0/1). - IMMUNOHISTOCHEMICAL STAIN FOR PANKERATIN (AE1/AE3) IS NEGATIVE. 6. Lymph node, sentinel, biopsy, left distal external iliac - LYMPH NODE, NEGATIVE FOR CARCINOMA (0/1). - IMMUNOHISTOCHEMICAL STAIN FOR PANKERATIN (  AE1/AE3) IS NEGATIVE 7.  Lymph nodes, regional resection, left pelvic - FIVE LYMPH NODES, NEGATIVE FOR CARCINOMA (0/5). 8. Lymph nodes, regional resection, right pelvic - THREE LYMPH NODES, NEGATIVE FOR TUMOR (0/3). 9. Omentum, resection for tumor - PORTION OF OMENTUM, NEGATIVE FOR TUMOR. Microscopic Comment 1. UTERUS, CARCINOMA OR CARCINOSARCOMA Procedure: Total hysterectomy with bilateral salpingo-oophorectomy. Histologic type: Endometrioid adenocarcinoma. Histologic Grade: FIGO grade 1. Myometrial invasion: Present. Depth of invasion: 13 mm. Myometrial thickness: 20 mm. Uterine Serosa Involvement: Not identified. Cervical stromal involvement: Not identified. Extent of involvement of other organs: Not applicable. Lymphovascular invasion: Not identified.  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   She was presented at Tumor Board (see separate notes in EPIC). Agreement wass for radiation (brachy), genetics, and for her breast followup.   Imported EPIC Oncologic History:     Granulosa cell carcinoma of right ovary ([place])   07/10/2018 Initial Diagnosis    Granulosa cell carcinoma of right ovary (HCC)    09/17/2018 Genetic Testing    CAS[facility] c.2301G>A (Silent) VUS identified on the Multi-cancer panel.  The Multi-Gene Panel offered by [facility] includes sequencing and/or deletion duplication testing of the following 84 genes: AIP, ALK, APC, ATM, AXIN2,BAP1,  BARD1, BLM, BMPR1A, BRCA1, BRCA2, BRIP1, CASR, CDC73, CDH1, CDK4, CDKN1B, CDKN1C, CDKN2A (p14ARF), CDKN2A (p16INK4a), CEBPA, CHEK2, CTNNA1, DICER1, DIS3L2, EGFR (c.2369C>T, p.Thr790Met variant only), EPCAM (Deletion/duplication testing only), FH, FLCN, GATA2, GPC3, GREM1 (Promoter region deletion/duplication testing only), HOXB13 (c.251G>A, p.Gly84Glu), HRAS,  KIT, MAX, MEN1, MET, MITF (c.952G>A, p.Glu318Lys variant only), MLH1, MSH2, MSH3, MSH6, MUTYH, NBN, NF1, NF2, NTHL1, PALB2, PDGFRA, PHOX2B, PMS2, POLD1, POLE, POT1, PRKAR1A, PTCH1, PTEN, RAD50, RAD51C, RAD51D, RB1, RECQL4, RET, RUNX1, SDHAF2, SDHA (sequence changes only), SDHB, SDHC, SDHD, SMAD4, SMARCA4, SMARCB1, SMARCE1, STK11, SUFU, TERC, TERT, TMEM127, TP53, TSC1, TSC2, VHL, WRN and WT1.  The report date is September 17, 2018.     Endometrial adenocarcinoma ([place])   06/10/2018 Initial Diagnosis    Endometrial adenocarcinoma ([place])    09/17/2018 Genetic Testing    CAS[facility] c.2301G>A (Silent) VUS identified on the Multi-cancer panel.  The Multi-Gene Panel offered by [facility] includes sequencing and/or deletion duplication testing of the following 84 genes: AIP, ALK, APC, ATM, AXIN2,BAP1,  BARD1, BLM, BMPR1A, BRCA1, BRCA2, BRIP1, CASR, CDC73, CDH1, CDK4, CDKN1B, CDKN1C, CDKN2A (p14ARF), CDKN2A (p16INK4a), CEBPA, CHEK2, CTNNA1, DICER1, DIS3L2, EGFR (c.2369C>T, p.Thr790Met variant only), EPCAM (Deletion/duplication testing only), FH, FLCN, GATA2, GPC3, GREM1 (Promoter region deletion/duplication testing only), HOXB13 (c.251G>A, p.Gly84Glu), HRAS, KIT, MAX, MEN1, MET, MITF (c.952G>A, p.Glu318Lys variant only), MLH1, MSH2, MSH3, MSH6, MUTYH, NBN, NF1, NF2, NTHL1, PALB2, PDGFRA, PHOX2B, PMS2, POLD1, POLE, POT1, PRKAR1A, PTCH1, PTEN, RAD50, RAD51C, RAD51D, RB1, RECQL4, RET, RUNX1, SDHAF2, SDHA (sequence changes only), SDHB, SDHC, SDHD, SMAD4, SMARCA4, SMARCB1, SMARCE1, STK11, SUFU, TERC, TERT, TMEM127, TP53, TSC1, TSC2, VHL, WRN and WT1.  The report date is September 17, 2018.     Measurement of disease:  Inhibin B 06/25/18 - preop 510.6 Recent Labs    06/25/18 1409  INHBB 510.6*    CA125 05/21/18 - Preop CA125 elevated for the lab at 30.7 (lab normal <21)     Radiology:  05/19/18 - TVUS -  8.7 x 5 x 4cm uterus , with 2 cm Em lining avascular. 2 complex masses in CDS 5.5x5x6cm and 6x6.3x6.4 cm  vascular  (ovaries versus other).  06/2018 - CT CAPIMPRESSION:1. Thickened irregular endometrium consistent with patient's knownendometrial cancer. I do not see any obvious extension beyondmyometrium.2. Large right adnexal mass could represent a solid ovarian neoplasmor a large exophytic fibroid.MRI may be helpful for furtherevaluation. The left ovary is normal.3. Enhancing 17 mm left medial breast mass. Recommend correlationwith physical exam, diagnostic mammography and ultrasound.4. Numerous small pulmonary nodules are likely benign as above.5.Small pelvic lymph nodes are likely benign.6. Status post right hemicolectomy.7. Cholelithiasis.   No results found.    Current Meds:  Outpatient Encounter Medications as of 11/11/2018  Medication Sig  . allopurinol (ZYLOPRIM) 100 MG tablet Take 100 mg by mouth daily.  . Alpha-Lipoic Acid 200 MG CAPS Take 200-400 mg by mouth See admin instructions. Take 200 mg by mouth in the morning and take 400 mg by mouth at bedtime  . amLODipine (NORVASC) 10 MG tablet Take 10 mg by mouth daily.   . carvedilol (COREG) 6.25 MG tablet Take 6.25 mg by mouth 2 (two) times daily with a meal.   . citalopram (CELEXA) 40 MG tablet Take 0.5 tablets (20 mg total) by mouth at bedtime. Take 20 mg once daily  . glucose blood (ONE TOUCH ULTRA TEST) test strip   . hydrochlorothiazide (HYDRODIURIL) 25 MG tablet Take 25 mg by mouth daily.   Marland Kitchen lisinopril (PRINIVIL,ZESTRIL) 20 MG tablet Take 20 mg by mouth daily.   . metFORMIN (GLUCOPHAGE) 500 MG tablet Take 1 tablet (500 mg total) by mouth 2 (two) times daily with a meal. Take one tablet (500 mg total) in the am and 500 mg in the pm until seen in the office for repeat creatinine level.  . nitroGLYCERIN (NITROSTAT) 0.4 MG SL tablet Place 0.4 mg under the tongue every 5 (five) minutes as needed for chest pain.   Andrea Hoffman DELICA LANCETS 36I MISC   . ONETOUCH VERIO test strip USE STRIP TO CHECK GLUCOSE AS DIRECTED ONCE DAILY IN THE MORNING  .  simvastatin (ZOCOR) 40 MG tablet Take 0.5 tablets (20 mg total) by mouth at bedtime. Until you see your primary care doctor   No facility-administered encounter medications on file as of 11/11/2018.     Allergy:  Allergies  Allergen Reactions  . Hydrocodone Other (See Comments)    Hallucinations    Past Surgical Hx:  Past Surgical History:  Procedure Laterality Date  . AMPUTATION TOE  2014   2 toes amputated  . COLONOSCOPY    . LAPAROTOMY WITH STAGING N/A 07/01/2018   Procedure: POSSIBLE LAPAROTOMY WITH STAGING;  Surgeon: [name], MD;  Location: [facility];  Service: Gynecology;  Laterality: N/A;  . PARTIAL COLECTOMY  2002 or 2003   Colon cancer [facility] - "took 18-22 inches"  . ROBOTIC ASSISTED TOTAL HYSTERECTOMY WITH BILATERAL SALPINGO OOPHERECTOMY Bilateral 07/01/2018   Procedure: XI ROBOTIC ASSISTED TOTAL HYSTERECTOMY WITH BILATERAL SALPINGO OOPHORECTOMY;  Surgeon: [name], MD;  Location: [facility];  Service: Gynecology;  Laterality: Bilateral;  . SENTINEL NODE BIOPSY N/A 07/01/2018   Procedure: SENTINEL NODE BIOPSY;  Surgeon: [name], MD;  Location: [facility];  Service: Gynecology;  Laterality: N/A;  . TONSILLECTOMY     Patient had her tonsils taken out at age 31  . TUBAL LIGATION  1974  . UPPER GI ENDOSCOPY      Past Medical Hx:  Past Medical History:  Diagnosis Date  . Anemia   . Broken arm    Right shoulder  .  Bunion   . Colon cancer ([place])    surgery followed by chemo for "6 months"  . Cyanocobalamin deficiency   . Depression   . Diabetes mellitus without complication ([place])   . Diabetic neuropathy ([place])   . Dyspnea    with exertion  . Endometrial cancer ([place])   . Family history of breast cancer   . Family history of colon cancer   . Family history of colonic polyps   . Hammer toe   . High cholesterol   . History of pneumonia   . Hypertension   . Migraines    history of  . Pulmonary nodules    Numerous small  . Rheumatic fever     age 32    Past Gynecological History:   GYNECOLOGIC HISTORY:  No LMP recorded. Patient is postmenopausal. Menarche: 72 years old P 4 LMP 20 years ago, then again with referral diagnosis Contraceptive never HRT 1 year  Last Pap  Referral Pap 05/2018 AGUS with negative HRHPV  Family Hx:  Family History  Problem Relation Age of Onset  . Heart attack Father        d. 59  . Diabetes Brother   . Colon polyps Brother   . Diabetes Maternal Grandmother        d. 27  . Lung cancer Mother   . Breast cancer Mother 31       ? bony mets; d. 40  . Breast cancer Maternal Aunt        dx over 65  . Colon cancer Maternal Uncle        dx over 93  . Breast cancer Maternal Aunt        dx over 15  . Cancer Cousin        mat first cousin; unknown type in their 38's  . Cancer Cousin        mat first cousin; Lung in their 41's  . Colon cancer Maternal Uncle        dx over 62  . COPD Maternal Grandfather   . Other Paternal Grandmother        childbirth bed fever, d. 51  . Breast cancer Cousin        pat first cousin d. 42s    Social Hx:  Tobacco use: never Alcohol use: never Illicit Drug use: denies Illicit IV Drug use: denies  Review of Systems:  Review of Systems  Constitutional: Negative.   HENT:  Negative.   Eyes: Negative.   Respiratory: Negative.   Cardiovascular: Negative.   Gastrointestinal: Negative.   Endocrine: Negative.   Genitourinary: Negative.    Musculoskeletal: Negative.   Skin: Negative.   Neurological: Negative.   Hematological: Negative.   Psychiatric/Behavioral: Positive for depression.    Vitals:  Vitals:   11/11/18 1400  BP: 130/63  Pulse: 90  Resp: 20  Temp: 98.2 F (36.8 C)  SpO2: 98%   Body mass index is 35.94 kg/m.   Physical Exam:  ECOG PERFORMANCE STATUS: 0 - Asymptomatic   General :  Obese. Well developed, 72 y.o., female in no apparent distress HEENT:  Normocephalic/atraumatic, symmetric, EOMI, eyelids normal Neck:   Supple,  no masses.  Lymphatics:  No cervical/ submandibular/ supraclavicular/ infraclavicular/ inguinal adenopathy Respiratory:  Respirations unlabored, no use of accessory muscles CV:   Deferred Breast:  Deferred Musculoskeletal: No CVA tenderness, normal muscle strength. Abdomen:  Trocar sites are soft without lesion. Soft, non-tender and nondistended. No evidence of hernia. No masses. Extremities:  No lymphedema, no erythema, non-tender. Skin:   Normal inspection Neuro/Psych:  No focal motor deficit, no abnormal mental status. Normal gait. Normal affect. Alert and oriented to person, place, and time  Genito Urinary: Vulva: Normal external female genitalia.  Bladder/urethra: Urethral meatus normal in size and location. No lesions or   masses, well supported bladder Speculum exam: Vagina: Vaginal cuff no lesions or masses. Vaginal caliber sufficient. No lesion. Bimanual exam: no mass  Cervix/Uterus/Adnexa: Surgically absent Rectovaginal:  No mass, normal tone. No parametrial or sidewall disease.   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 Path was previously reviewed and she was given a copy of that and the operative note 2. Stage IA Granulosa Cell Tumor o No further treatment pending tumor board 3. Stage IA Endometrial Cancer o HIR -- >50% DOI and >70 yo o Cleared for radiation - brachy to decrease her risk of local recurrence 4. Surveillance plan o Dr. Sondra Come will see her in March and I will see her back in June o Given the granulosa cell tumor I will check labs today and in 3 months when she sees Dr. Sondra Come. RTC 3 months for first surveillance o If brachy then no benefit to annual Pap o I think she can hold off for now on the dilator. She may have some emotional swings tied to the use and I think she has adequate caliber. 5. Depression per PCP. We discussed the removal of her ovaries may have  taken a small bit of hormones away. PCP may need to increase her dosing. Likely this is adjustment to facing another (2) diagnosis of cancer. 6. 06/2018 scan 7m pulmonary nodules - we'll repeat imaging per FRobin Searingguidelines  Face to face time with patient was 15 minutes. Over 50% of this time was spent on counseling and coordination of care.  SIsabel Caprice MD  11/11/2018, 2:31 PM    Cc: CAshby Dawes DO (Referring Ob/Gyn) JCecille Amsterdam DO (PCP) JGery Pray MD (Radiation Oncologist)

## 2018-11-11 NOTE — Patient Instructions (Signed)
1. Keep your appointment with Dr.Kinard March 2. We will want blood work when he sees you 3. Today we will be checking the same bloodwork and let you know the results 4. Back to see Dr. Gerarda Fraction in 6 months

## 2018-11-12 ENCOUNTER — Encounter: Payer: Self-pay | Admitting: Obstetrics

## 2018-11-12 LAB — INHIBIN B: Inhibin B: <7 pg/mL (ref 0.0–16.9)

## 2018-11-12 LAB — CA 125: CANCER ANTIGEN (CA) 125: 13.3 U/mL (ref 0.0–38.1)

## 2018-11-13 ENCOUNTER — Telehealth: Payer: Self-pay | Admitting: *Deleted

## 2018-11-13 NOTE — Telephone Encounter (Signed)
Called patient to inform her of her lab results from 11/11/18.  Per Joylene John, NP lab results are within normal range, Inhibin B is 7.0, and CA 125 are 13.3, both of these are within normal range.  Patient verbalized understanding.

## 2019-02-11 ENCOUNTER — Ambulatory Visit: Payer: Medicare HMO | Admitting: Radiation Oncology

## 2019-02-11 ENCOUNTER — Telehealth: Payer: Self-pay | Admitting: *Deleted

## 2019-02-11 ENCOUNTER — Other Ambulatory Visit: Payer: Medicare HMO

## 2019-02-11 DIAGNOSIS — I951 Orthostatic hypotension: Secondary | ICD-10-CM

## 2019-02-11 DIAGNOSIS — R197 Diarrhea, unspecified: Secondary | ICD-10-CM

## 2019-02-11 DIAGNOSIS — R112 Nausea with vomiting, unspecified: Secondary | ICD-10-CM

## 2019-02-11 DIAGNOSIS — I10 Essential (primary) hypertension: Secondary | ICD-10-CM

## 2019-02-11 DIAGNOSIS — E871 Hypo-osmolality and hyponatremia: Secondary | ICD-10-CM

## 2019-02-11 DIAGNOSIS — E119 Type 2 diabetes mellitus without complications: Secondary | ICD-10-CM

## 2019-02-11 DIAGNOSIS — E860 Dehydration: Secondary | ICD-10-CM | POA: Diagnosis not present

## 2019-02-11 NOTE — Telephone Encounter (Signed)
Attempted to call the patient to reschedule her appt, husband answered the phone and stated "Andrea Hoffman is at the hospital." Explained that I would call her back tomorrow

## 2019-02-12 DIAGNOSIS — I951 Orthostatic hypotension: Secondary | ICD-10-CM | POA: Diagnosis not present

## 2019-02-12 DIAGNOSIS — E119 Type 2 diabetes mellitus without complications: Secondary | ICD-10-CM | POA: Diagnosis not present

## 2019-02-12 DIAGNOSIS — E871 Hypo-osmolality and hyponatremia: Secondary | ICD-10-CM | POA: Diagnosis not present

## 2019-02-12 DIAGNOSIS — R112 Nausea with vomiting, unspecified: Secondary | ICD-10-CM | POA: Diagnosis not present

## 2019-02-16 ENCOUNTER — Telehealth: Payer: Self-pay | Admitting: *Deleted

## 2019-02-16 NOTE — Telephone Encounter (Signed)
Called and spoke with the patient. Scheduled her appt with Dr. Gerarda Fraction on 6/22 to Dr. Skeet Latch on 6/11. Patient needed to reschedule her appt with Dr. Sondra Come, transferred her to radiation

## 2019-02-16 NOTE — Telephone Encounter (Signed)
XXX

## 2019-02-18 ENCOUNTER — Other Ambulatory Visit: Payer: Medicare HMO

## 2019-03-02 ENCOUNTER — Other Ambulatory Visit: Payer: Medicare HMO

## 2019-05-11 NOTE — Progress Notes (Signed)
Progress Note: Established Patient Surveillance Visit  Referring Physician:  Dr. Ashby Dawes  Chief Complaint  Patient presents with  . 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 Endometrial adenocarcinoma [facility])   Gyn Oncologic Summary 1. 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 HIR Stage IB, Grade 1 Endometrioid Endometrial Adenocarcinoma   07/01/18 Robotic-assisted TLH/BSO pelvic lymphadenectomy and SLN biopsy, Pelvic washings, Omentectomy   08/17/18 - 09/07/18 Vaginal brachytherapy (Dr. Sondra Come) - 5 Fxn 3. Genetic testing negative (VUS in CASR)  HPI: [name]  is a very nice 73 y.o.  P4  . Interval History  She presents for surveillance today. No bleeding, no pelvic pain.    . Oncologic Course  She noted postmenopausal bleeding described as spotting a few months prior to presentation. Noted some cramps, otherwise no significant symptoms. She called her PCP to report the bleeding and then was referred to Dr. Octaviano Glow for work-up. A Pap smear was done revealing AGUS with HRHPV negative. A cervical polyp was removed and found to be benign.  05/19/18 - TVUS -  8.7 x 5 x 4cm uterus , with 2 cm Em lining avascular. 2 complex masses in CDS 5.5x5x6cm and 6x6.3x6.4 cm vascular (ovaries versus other).  06/2018 - CT CAPIMPRESSION:1. Thickened irregular endometrium consistent with patient's knownendometrial cancer. I do not see any obvious extension beyondmyometrium.2. Large right adnexal mass could represent a solid ovarian neoplasmor a large exophytic fibroid.MRI may be helpful for furtherevaluation. The left ovary is normal.3. Enhancing 17 mm left medial breast mass. Recommend correlationwith physical exam, diagnostic mammography and ultrasound.4. Numerous small pulmonary nodules are likely benign as above.5.Small pelvic lymph nodes are likely benign.6. Status post right hemicolectomy.7. Cholelithiasis  Followup TVUS showing thickened EM lining and  bilateral adnexal masses. Endometrial biopsy  (read by [facility]) revealed grade 1 endometrial adenocarcinoma.   CA125 elevated for the lab at 30.7 (lab normal <21) CEA mildly elevated at 3.3 (lab normal <3 for nonsmokers)  She underwent on 07/01/18 Robotic-assisted TLH/BSO pelvic lymphadenectomy and SLN biopsy, Pelvic washings, Omentectomy (partial) Findings included ; Adhesions of omentum to anterior abdominal wall. Normal sized uterus. Normal appearing left ovary. Right ovary enlarged and firm; bilobed. Frozen on uterus >50% depth of invasion. 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 Sentinel uptake left and right external iliac vessels, both proximal and distal, so 4 total sentinel lymph nodes. Mild adenopathy in right proximal external node. No gross extrapelvic disease    Final pathology revealed: Uterus, cervix and bilateral fallopian tubes, left ovary - ENDOMETRIOID ADENOCARCINOMA, 4.1 CM, FIGO GRADE 1. - CARCINOMA INVADES FOR A DEPTH OF 1.3 CM WHERE MYOMETRIAL THICKNESS IS 2.0 CM (GREATER THAN 50%). - NEGATIVE FOR LYMPHOVASCULAR OR PERINEURAL INVASION. - BENIGN LEIOMYOMATA. - BENIGN CERVICAL POLYP. - BENIGN UNREMARKABLE BILATERAL FALLOPIAN TUBES AND LEFT OVARY. - SEE ONCOLOGY TABLE. - SEE NOTE. 2. 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 - OVARIAN SURFACE IS NOT INVOLVED BY TUMOR. - NO SIGNIFICANT MITOTIC ACTIVITY OR NECROSIS. - SEE ONCOLOGY TABLE. - SEE NOTE. 3. Lymph node, sentinel, biopsy, right external iliac - LYMPH NODE, NEGATIVE FOR CARCINOMA (0/1). - IMMUNOHISTOCHEMICAL STAIN FOR PANKERATIN (AE1/AE3) IS NEGATIVE 4. Lymph node, sentinel, biopsy, right distal external iliac - LYMPH NODE, NEGATIVE FOR CARCINOMA (0/1). - IMMUNOHISTOCHEMICAL STAIN FOR PANKERATIN (AE1/AE3) IS NEGATIVE 5. Lymph node, sentinel, biopsy, left external iliac - LYMPH NODE, NEGATIVE  FOR CARCINOMA (0/1). - IMMUNOHISTOCHEMICAL STAIN FOR PANKERATIN (AE1/AE3) IS NEGATIVE. 6. Lymph node, sentinel, biopsy, left distal external iliac - LYMPH NODE, NEGATIVE FOR CARCINOMA (0/1). - IMMUNOHISTOCHEMICAL STAIN FOR PANKERATIN (AE1/AE3) IS NEGATIVE 7. Lymph nodes, regional resection, left pelvic - FIVE LYMPH NODES, NEGATIVE FOR CARCINOMA (0/5). 8. Lymph nodes, regional resection, right pelvic - THREE LYMPH NODES, NEGATIVE FOR TUMOR (0/3). 9. Omentum, resection for tumor - PORTION OF OMENTUM, NEGATIVE FOR TUMOR. Microscopic Comment 1. UTERUS, CARCINOMA OR CARCINOSARCOMA Procedure: Total hysterectomy with bilateral salpingo-oophorectomy. Histologic type: Endometrioid adenocarcinoma. Histologic Grade: FIGO grade 1. Myometrial invasion: Present. Depth of invasion: 13 mm. Myometrial thickness: 20 mm. Uterine Serosa Involvement: Not identified. Cervical stromal involvement: Not identified. Extent of involvement of other organs: Not applicable. Lymphovascular invasion: Not identified.  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   She was presented at [facility] (see separate notes in EPIC). Agreement wass for radiation (brachy), genetics, and for her breast followup.   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  The Multi-Gene Panel offered by [facility] includes sequencing and/or deletion duplication testing of the following 84 genes: AIP, ALK, APC, ATM, AXIN2,BAP1,  BARD1, BLM, BMPR1A, BRCA1, BRCA2, BRIP1, CASR, CDC73, CDH1, CDK4, CDKN1B, CDKN1C, CDKN2A  (p14ARF), CDKN2A (p16INK4a), CEBPA, CHEK2, CTNNA1, DICER1, DIS3L2, EGFR (c.2369C>T, p.Thr790Met variant only), EPCAM (Deletion/duplication testing only), FH, FLCN, GATA2, GPC3, GREM1 (Promoter region deletion/duplication testing only), HOXB13 (c.251G>A, p.Gly84Glu), HRAS, KIT, MAX, MEN1, MET, MITF (c.952G>A, p.Glu318Lys variant only), MLH1, MSH2, MSH3, MSH6, MUTYH, NBN, NF1, NF2, NTHL1, PALB2, PDGFRA, PHOX2B, PMS2, POLD1, POLE, POT1, PRKAR1A, PTCH1, PTEN, RAD50, RAD51C, RAD51D, RB1, RECQL4, RET, RUNX1, SDHAF2, SDHA (sequence changes only), SDHB, SDHC, SDHD, SMAD4, SMARCA4, SMARCB1, SMARCE1, STK11, SUFU, TERC, TERT, TMEM127, TP53, TSC1, TSC2, VHL, WRN and WT1.  The report date is September 17, 2018.     Endometrial adenocarcinoma ([place])   06/10/2018 Initial Diagnosis    Endometrial adenocarcinoma ([place])    09/17/2018 Genetic Testing    CASR c.2301G>A (Silent) VUS identified on the Multi-cancer panel.  The Multi-Gene Panel offered by [facility] includes sequencing and/or deletion duplication testing of the following 84 genes: AIP, ALK, APC, ATM, AXIN2,BAP1,  BARD1, BLM, BMPR1A, BRCA1, BRCA2, BRIP1, CASR, CDC73, CDH1, CDK4, CDKN1B, CDKN1C, CDKN2A (p14ARF), CDKN2A (p16INK4a), CEBPA, CHEK2, CTNNA1, DICER1, DIS3L2, EGFR (c.2369C>T, p.Thr790Met variant only), EPCAM (Deletion/duplication testing only), FH, FLCN, GATA2, GPC3, GREM1 (Promoter region deletion/duplication testing only), HOXB13 (c.251G>A, p.Gly84Glu), HRAS, KIT, MAX, MEN1, MET, MITF (c.952G>A, p.Glu318Lys variant only), MLH1, MSH2, MSH3, MSH6, MUTYH, NBN, NF1, NF2, NTHL1, PALB2, PDGFRA, PHOX2B, PMS2, POLD1, POLE, POT1, PRKAR1A, PTCH1, PTEN, RAD50, RAD51C, RAD51D, RB1, RECQL4, RET, RUNX1, SDHAF2, SDHA (sequence changes only), SDHB, SDHC, SDHD, SMAD4, SMARCA4, SMARCB1, SMARCE1, STK11, SUFU, TERC, TERT, TMEM127, TP53, TSC1, TSC2, VHL, WRN and WT1.  The report date is September 17, 2018.     Measurement of disease:  Inhibin B 06/25/18 - preop 510.6 Recent Labs     06/25/18 1409 11/11/18 1512  CAN125  --  13.3  INHBB 510.6* <7.0     Current Meds:  Outpatient Encounter  Medications as of 05/13/2019  Medication Sig  . allopurinol (ZYLOPRIM) 100 MG tablet Take 100 mg by mouth daily.  . Alpha-Lipoic Acid 200 MG CAPS Take 200-400 mg by mouth See admin instructions. Take 200 mg by mouth in the morning and take 400 mg by mouth at bedtime  . amLODipine (NORVASC) 10 MG tablet Take 10 mg by mouth daily.   . carvedilol (COREG) 6.25 MG tablet Take 6.25 mg by mouth 2 (two) times daily with a meal.   . citalopram (CELEXA) 40 MG tablet Take 0.5 tablets (20 mg total) by mouth at bedtime. Take 20 mg once daily  . glucose blood (ONE TOUCH ULTRA TEST) test strip   . hydrochlorothiazide (HYDRODIURIL) 25 MG tablet Take 25 mg by mouth daily.   Marland Kitchen lisinopril (PRINIVIL,ZESTRIL) 20 MG tablet Take 20 mg by mouth daily.   . metFORMIN (GLUCOPHAGE) 500 MG tablet Take 1 tablet (500 mg total) by mouth 2 (two) times daily with a meal. Take one tablet (500 mg total) in the am and 500 mg in the pm until seen in the office for repeat creatinine level.  . nitroGLYCERIN (NITROSTAT) 0.4 MG SL tablet Place 0.4 mg under the tongue every 5 (five) minutes as needed for chest pain.   [name] DELICA LANCETS 27O MISC   . ONETOUCH VERIO test strip USE STRIP TO CHECK GLUCOSE AS DIRECTED ONCE DAILY IN THE MORNING  . simvastatin (ZOCOR) 40 MG tablet Take 0.5 tablets (20 mg total) by mouth at bedtime. Until you see your primary care doctor   No facility-administered encounter medications on file as of 05/13/2019.     Allergy:  Allergies  Allergen Reactions  . Hydrocodone Other (See Comments)    Hallucinations    Past Surgical Hx:  Past Surgical History:  Procedure Laterality Date  . AMPUTATION TOE  2014   2 toes amputated  . COLONOSCOPY    . LAPAROTOMY WITH STAGING N/A 07/01/2018   Procedure: POSSIBLE LAPAROTOMY WITH STAGING;  Surgeon: [name], MD;  Location: [facility];   Service: Gynecology;  Laterality: N/A;  . PARTIAL COLECTOMY  2002 or 2003   Colon cancer [facility] - "took 18-22 inches"  . ROBOTIC ASSISTED TOTAL HYSTERECTOMY WITH BILATERAL SALPINGO OOPHERECTOMY Bilateral 07/01/2018   Procedure: XI ROBOTIC ASSISTED TOTAL HYSTERECTOMY WITH BILATERAL SALPINGO OOPHORECTOMY;  Surgeon: [name], MD;  Location: [facility];  Service: Gynecology;  Laterality: Bilateral;  . SENTINEL NODE BIOPSY N/A 07/01/2018   Procedure: SENTINEL NODE BIOPSY;  Surgeon: [name], MD;  Location: [facility];  Service: Gynecology;  Laterality: N/A;  . TONSILLECTOMY     Patient had her tonsils taken out at age 66  . TUBAL LIGATION  1974  . UPPER GI ENDOSCOPY      Past Medical Hx:  Past Medical History:  Diagnosis Date  . Anemia   . Broken arm    Right shoulder  . Bunion   . Colon cancer ([place])    surgery followed by chemo for "6 months"  . Cyanocobalamin deficiency   . Depression   . Diabetes mellitus without complication ([place])   . Diabetic neuropathy ([place])   . Dyspnea    with exertion  . Endometrial cancer ([place])   . Hammer toe   . High cholesterol   . History of pneumonia   . Hypertension   . Migraines    history of  . Pulmonary nodules    Numerous small  . Rheumatic fever  age 37    Past Gynecological History:   GYNECOLOGIC HISTORY:  No LMP recorded. Patient is postmenopausal. Menarche: 73 years old P 4 LMP 20 years ago, then again with referral diagnosis Contraceptive never HRT 1 year  Last Pap  Referral Pap 05/2018 AGUS with negative HRHPV  Family Hx:  Family History  Problem Relation Age of Onset  . Heart attack Father        d. 13  . Diabetes Brother   . Colon polyps Brother   . Diabetes Maternal Grandmother        d. 54  . Lung cancer Mother   . Breast cancer Mother 68       ? bony mets; d. 84  . Breast cancer Maternal Aunt        dx over 46  . Colon cancer Maternal Uncle        dx over 30  . Breast cancer Maternal Aunt         dx over 69  . Cancer Cousin        mat first cousin; unknown type in their 56's  . Cancer Cousin        mat first cousin; Lung in their 38's  . Colon cancer Maternal Uncle        dx over 63  . COPD Maternal Grandfather   . Other Paternal Grandmother        childbirth bed fever, d. 19  . Breast cancer Cousin        pat first cousin d. 62s    Social Hx:  Tobacco use: never Alcohol use: never Illicit Drug use: denies Illicit IV Drug use: denies  Review of Systems:  Review of Systems  Constitutional: Positive for malaise/fatigue. Negative for chills, fever and weight loss.  HENT: Negative for congestion and sinus pain.   Eyes: Negative for blurred vision and pain.  Respiratory: Negative for cough, sputum production and shortness of breath.   Cardiovascular: Negative for leg swelling and PND.  Gastrointestinal: Negative for abdominal pain, blood in stool, constipation, diarrhea, heartburn, nausea and vomiting.  Genitourinary: Negative for dysuria, frequency and urgency.       No vaginal bleeding  Musculoskeletal: Negative.   Skin: Negative for itching and rash.  Neurological: Negative.      Vitals:  Vitals:   05/13/19 1316  BP: (!) 112/44  Pulse: 88  Resp: 19  Temp: 98.7 F (37.1 C)  SpO2: 98%   Body mass index is 35.81 kg/m.   Physical Exam:  ECOG PERFORMANCE STATUS: 0 - Asymptomatic   General :  Obese. Well developed, 73 y.o., female in no apparent distress HEENT:  Normocephalic/atraumatic, symmetric, EOMI, eyelids normal Neck:   Supple, no masses.  Lymphatics:  No cervical/ submandibular/ supraclavicular/ infraclavicular/ inguinal adenopathy Respiratory:  Respirations unlabored, no use of accessory muscles CV:   Deferred Breast:  Deferred Musculoskeletal: No CVA tenderness, normal muscle strength. Abdomen:  Trocar sites are soft without lesion. Soft, non-tender and nondistended. No evidence of hernia. No masses. Extremities:  No lymphedema, no erythema,  non-tender. Skin:   Normal inspection Neuro/Psych:  No focal motor deficit, no abnormal mental status. Normal gait. Normal affect. Alert and oriented to person, place, and time  Genito Urinary: Vulva: Normal external female genitalia.  Bladder/urethra: Urethral meatus normal in size and location. No lesions or   masses, well supported bladder Speculum exam: Vagina: Vaginal cuff no lesions or masses. Vaginal caliber sufficient. Vaginal wall prolapse Bimanual exam: no mass  Cervix/Uterus/Adnexa: Surgically absent Rectovaginal:  No mass, normal tone. No parametrial or sidewall disease.   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 Path was previously reviewed and she was given a copy of that and the operative note 2. Stage IA Granulosa Cell Tumor 3. No further treatment  4. Stage IA Endometrial Cancer o HIR -- >50% DOI and >70 yo o S/p vaginal brachy to decrease her risk of local recurrence 5. Surveillance plan o Dr. Sondra Come will see her in Sept 2020  and I will see in Dec 2020 o Given the granulosa cell tumor I will check labs every 6 months  6. 06/2018 scan 48m pulmonary nodules - we'll repeat imaging per FRobin Searingguidelines   WJanie Morning MD  05/11/2019, 4:27 PM    Cc: CAshby Dawes DO (Referring Ob/Gyn) JCecille Amsterdam DO (PCP) JGery Pray MD (Radiation Oncologist)

## 2019-05-13 ENCOUNTER — Other Ambulatory Visit: Payer: Self-pay | Admitting: Gynecologic Oncology

## 2019-05-13 ENCOUNTER — Encounter: Payer: Self-pay | Admitting: Gynecologic Oncology

## 2019-05-13 ENCOUNTER — Inpatient Hospital Stay: Payer: Medicare HMO

## 2019-05-13 ENCOUNTER — Other Ambulatory Visit: Payer: Self-pay

## 2019-05-13 ENCOUNTER — Inpatient Hospital Stay: Payer: Medicare HMO | Attending: Obstetrics | Admitting: Gynecologic Oncology

## 2019-05-13 VITALS — BP 112/44 | HR 88 | Temp 98.7°F | Resp 19 | Ht 65.0 in | Wt 215.2 lb

## 2019-05-13 DIAGNOSIS — Z90722 Acquired absence of ovaries, bilateral: Secondary | ICD-10-CM | POA: Diagnosis not present

## 2019-05-13 DIAGNOSIS — C561 Malignant neoplasm of right ovary: Secondary | ICD-10-CM

## 2019-05-13 DIAGNOSIS — R918 Other nonspecific abnormal finding of lung field: Secondary | ICD-10-CM

## 2019-05-13 DIAGNOSIS — Z923 Personal history of irradiation: Secondary | ICD-10-CM

## 2019-05-13 DIAGNOSIS — Z85038 Personal history of other malignant neoplasm of large intestine: Secondary | ICD-10-CM | POA: Insufficient documentation

## 2019-05-13 DIAGNOSIS — Z90710 Acquired absence of both cervix and uterus: Secondary | ICD-10-CM

## 2019-05-13 DIAGNOSIS — C541 Malignant neoplasm of endometrium: Secondary | ICD-10-CM | POA: Diagnosis not present

## 2019-05-13 LAB — BASIC METABOLIC PANEL
Anion gap: 15 (ref 5–15)
BUN: 27 mg/dL — ABNORMAL HIGH (ref 8–23)
CO2: 19 mmol/L — ABNORMAL LOW (ref 22–32)
Calcium: 8.9 mg/dL (ref 8.9–10.3)
Chloride: 100 mmol/L (ref 98–111)
Creatinine, Ser: 1.41 mg/dL — ABNORMAL HIGH (ref 0.44–1.00)
GFR calc Af Amer: 43 mL/min — ABNORMAL LOW (ref >60)
GFR calc non Af Amer: 37 mL/min — ABNORMAL LOW (ref >60)
Glucose, Bld: 288 mg/dL — ABNORMAL HIGH (ref 70–99)
Potassium: 4.7 mmol/L (ref 3.5–5.1)
Sodium: 134 mmol/L — ABNORMAL LOW (ref 135–145)

## 2019-05-13 NOTE — Patient Instructions (Addendum)
Plan to have a CT scan of the chest to follow up on the pulmonary nodules seen on the last scan.  Plan to follow up in six months or sooner if needed with lab work before.  Please call for any questions or concerns or new/persistent symptoms such as abdominal pain/bloating/change in bowel or bladder habits/vaginal bleeding/bleeding from your bladder and rectum/ or any new persistent symptoms.

## 2019-05-14 ENCOUNTER — Telehealth: Payer: Self-pay

## 2019-05-14 LAB — CA 125: Cancer Antigen (CA) 125: 10.4 U/mL (ref 0.0–38.1)

## 2019-05-14 NOTE — Telephone Encounter (Signed)
Told Andrea Andrea Hoffman that Dr. Skeet Latch wants to follow up on the pulmonary nodules seen on the msost recent CT scan to make sure they are stable. A CT scan is scheduled for 06-02-19 at [facility] at 1200. Arrive at [facility] radiology at 1145 am.  Liquids only 4 hours prior to CT. The B-met from yesterday showed that her BS was elevated at 288 and that her kidney function was elevated at 1.41. A copy of the labs were faxed to her PCP.  She should call him and see if he has any recommendations. She needs to increase her water intake to 64 ounces of fluid a day. Her kidney function tests will need to be repeated prior to the CT scan.  This is scheduled for 05-31-19 at the [facility] at 1100. Will let her know if the kidney function is ok for CT scan.  Told Andrea Hoffman that she will need to call the office in  October to schedule a follow up with Dr. Skeet Latch in December.   Pt verbalized understanding and wrote information down.

## 2019-05-17 ENCOUNTER — Ambulatory Visit (HOSPITAL_COMMUNITY): Admission: RE | Admit: 2019-05-17 | Payer: Medicare HMO | Source: Ambulatory Visit

## 2019-05-18 LAB — INHIBIN B: Inhibin B: <7 pg/mL (ref 0.0–16.9)

## 2019-05-21 ENCOUNTER — Telehealth: Payer: Self-pay

## 2019-05-21 NOTE — Telephone Encounter (Signed)
Told Husband that the tumor markers for her cancer are stable and WNL. Husband will relay this information to Ms Mignogna.  She is with her brother who is dying from cancer.

## 2019-05-24 ENCOUNTER — Other Ambulatory Visit: Payer: Medicare HMO

## 2019-05-24 ENCOUNTER — Ambulatory Visit: Payer: Medicare HMO | Admitting: Obstetrics

## 2019-05-27 ENCOUNTER — Telehealth: Payer: Self-pay | Admitting: *Deleted

## 2019-05-27 NOTE — Telephone Encounter (Signed)
Patient called and left a message stating "I have been down in [place] working on my brothers stuff. He died of cancer. I have been here a couple weeks, just wanted to let you know." Called and spoke with the patient. Explained that as long has she haven't been around anyone with the virus or has tested positive for the virus. Explained to monitor herself and call if she has anything that comes up.

## 2019-05-31 ENCOUNTER — Telehealth: Payer: Self-pay

## 2019-05-31 ENCOUNTER — Other Ambulatory Visit: Payer: Self-pay

## 2019-05-31 ENCOUNTER — Inpatient Hospital Stay: Payer: Medicare HMO

## 2019-05-31 DIAGNOSIS — C561 Malignant neoplasm of right ovary: Secondary | ICD-10-CM

## 2019-05-31 DIAGNOSIS — C541 Malignant neoplasm of endometrium: Secondary | ICD-10-CM | POA: Diagnosis not present

## 2019-05-31 LAB — BASIC METABOLIC PANEL
Anion gap: 11 (ref 5–15)
BUN: 27 mg/dL — ABNORMAL HIGH (ref 8–23)
CO2: 25 mmol/L (ref 22–32)
Calcium: 9.3 mg/dL (ref 8.9–10.3)
Chloride: 101 mmol/L (ref 98–111)
Creatinine, Ser: 1.37 mg/dL — ABNORMAL HIGH (ref 0.44–1.00)
GFR calc Af Amer: 45 mL/min — ABNORMAL LOW (ref >60)
GFR calc non Af Amer: 38 mL/min — ABNORMAL LOW (ref >60)
Glucose, Bld: 232 mg/dL — ABNORMAL HIGH (ref 70–99)
Potassium: 4.3 mmol/L (ref 3.5–5.1)
Sodium: 137 mmol/L (ref 135–145)

## 2019-05-31 NOTE — Telephone Encounter (Signed)
Routed B-met to patient's PCP Dr. Cecille Amsterdam.

## 2019-05-31 NOTE — Telephone Encounter (Signed)
Kidney function has improved on B-met from today. Andrea John, NP stated that [name] is ok to have CT chest as scheduled on 06-02-19 at 1200. Liquids only after 0800 on 06-02-19 until scan completed. Pt verbalized understanding.

## 2019-06-02 ENCOUNTER — Ambulatory Visit (HOSPITAL_COMMUNITY)
Admission: RE | Admit: 2019-06-02 | Discharge: 2019-06-02 | Disposition: A | Discharge status: Home / Self Care | Payer: Medicare HMO | Source: Ambulatory Visit | Attending: Gynecologic Oncology | Admitting: Gynecologic Oncology

## 2019-06-02 ENCOUNTER — Encounter (HOSPITAL_COMMUNITY): Payer: Self-pay

## 2019-06-02 ENCOUNTER — Other Ambulatory Visit: Payer: Self-pay

## 2019-06-02 DIAGNOSIS — R918 Other nonspecific abnormal finding of lung field: Secondary | ICD-10-CM

## 2019-06-02 MED ORDER — SODIUM CHLORIDE (PF) 0.9 % IJ SOLN
INTRAMUSCULAR | Status: AC
  Filled 2019-06-02: qty 50

## 2019-06-02 MED ORDER — IOHEXOL 300 MG/ML  SOLN
75.0000 mL | Freq: Once | INTRAMUSCULAR | Status: AC | PRN
  Administered 2019-06-02: 13:00:00 75 mL via INTRAVENOUS

## 2019-06-07 ENCOUNTER — Telehealth: Payer: Self-pay

## 2019-06-07 NOTE — Telephone Encounter (Signed)
Told Andrea Hoffman that the CT scan showed that the nodules in her chest are stable in size and number from 06-19-18.  They are probably benign. No sign of any cancer in her chest per Joylene John, NP. The shows some aortic atherosclerosis and some calcifications of the mitrial annulus and mitral subvalvular apparatus.  These results will be sent to her PCP to review to see if further evaluation is needed per Joylene John, NP. CT scan faxed to PCP Dr. Wendie Agreste.

## 2019-10-27 ENCOUNTER — Telehealth: Payer: Self-pay | Admitting: *Deleted

## 2019-10-27 NOTE — Telephone Encounter (Signed)
Patient called and scheduled an appt for lab/MD visit in December with Dr Berline Lopes

## 2019-11-22 ENCOUNTER — Inpatient Hospital Stay: Payer: Medicare HMO

## 2019-11-22 ENCOUNTER — Inpatient Hospital Stay: Payer: Medicare HMO | Admitting: Gynecologic Oncology

## 2019-12-10 ENCOUNTER — Telehealth: Payer: Self-pay | Admitting: *Deleted

## 2019-12-10 NOTE — Telephone Encounter (Signed)
Patient called and rescheduled her appt from 1/12 to 2/17, due to having COVID

## 2019-12-14 ENCOUNTER — Inpatient Hospital Stay: Payer: Medicare HMO

## 2019-12-14 ENCOUNTER — Inpatient Hospital Stay: Payer: Medicare HMO | Admitting: Gynecologic Oncology

## 2020-01-14 ENCOUNTER — Telehealth: Payer: Self-pay | Admitting: *Deleted

## 2020-01-14 NOTE — Telephone Encounter (Signed)
Called and rescheduled her appt from 2/17 to 3/3

## 2020-01-19 ENCOUNTER — Ambulatory Visit: Payer: Medicare HMO | Admitting: Obstetrics & Gynecology

## 2020-01-19 ENCOUNTER — Other Ambulatory Visit: Payer: Medicare HMO

## 2020-02-02 ENCOUNTER — Inpatient Hospital Stay (HOSPITAL_BASED_OUTPATIENT_CLINIC_OR_DEPARTMENT_OTHER): Payer: Medicare HMO | Admitting: Obstetrics & Gynecology

## 2020-02-02 ENCOUNTER — Other Ambulatory Visit: Payer: Self-pay

## 2020-02-02 ENCOUNTER — Encounter: Payer: Self-pay | Admitting: Obstetrics & Gynecology

## 2020-02-02 ENCOUNTER — Inpatient Hospital Stay: Payer: Medicare HMO | Attending: Obstetrics & Gynecology

## 2020-02-02 VITALS — BP 134/66 | Temp 98.5°F | Resp 17 | Ht 65.0 in | Wt 210.8 lb

## 2020-02-02 DIAGNOSIS — Z90722 Acquired absence of ovaries, bilateral: Secondary | ICD-10-CM | POA: Insufficient documentation

## 2020-02-02 DIAGNOSIS — Z85038 Personal history of other malignant neoplasm of large intestine: Secondary | ICD-10-CM | POA: Diagnosis not present

## 2020-02-02 DIAGNOSIS — R971 Elevated cancer antigen 125 [CA 125]: Secondary | ICD-10-CM | POA: Insufficient documentation

## 2020-02-02 DIAGNOSIS — Z885 Allergy status to narcotic agent status: Secondary | ICD-10-CM | POA: Diagnosis not present

## 2020-02-02 DIAGNOSIS — D259 Leiomyoma of uterus, unspecified: Secondary | ICD-10-CM | POA: Diagnosis not present

## 2020-02-02 DIAGNOSIS — Z836 Family history of other diseases of the respiratory system: Secondary | ICD-10-CM | POA: Insufficient documentation

## 2020-02-02 DIAGNOSIS — C541 Malignant neoplasm of endometrium: Secondary | ICD-10-CM | POA: Diagnosis not present

## 2020-02-02 DIAGNOSIS — Z79899 Other long term (current) drug therapy: Secondary | ICD-10-CM | POA: Diagnosis not present

## 2020-02-02 DIAGNOSIS — R918 Other nonspecific abnormal finding of lung field: Secondary | ICD-10-CM | POA: Diagnosis not present

## 2020-02-02 DIAGNOSIS — Z801 Family history of malignant neoplasm of trachea, bronchus and lung: Secondary | ICD-10-CM | POA: Diagnosis not present

## 2020-02-02 DIAGNOSIS — Z8249 Family history of ischemic heart disease and other diseases of the circulatory system: Secondary | ICD-10-CM | POA: Insufficient documentation

## 2020-02-02 DIAGNOSIS — Z833 Family history of diabetes mellitus: Secondary | ICD-10-CM | POA: Diagnosis not present

## 2020-02-02 DIAGNOSIS — Z8371 Family history of colonic polyps: Secondary | ICD-10-CM | POA: Insufficient documentation

## 2020-02-02 DIAGNOSIS — R599 Enlarged lymph nodes, unspecified: Secondary | ICD-10-CM | POA: Insufficient documentation

## 2020-02-02 DIAGNOSIS — Z803 Family history of malignant neoplasm of breast: Secondary | ICD-10-CM | POA: Insufficient documentation

## 2020-02-02 DIAGNOSIS — C561 Malignant neoplasm of right ovary: Secondary | ICD-10-CM | POA: Insufficient documentation

## 2020-02-02 NOTE — Progress Notes (Signed)
Progress Note: Established Patient Surveillance Visit  Referring Physician:  Dr. Ashby Dawes CC: Returns for routine f/u   Gyn Oncologic Summary 1. 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 HIR Stage IB, Grade 1 Endometrioid Endometrial Adenocarcinoma   07/01/18 Robotic-assisted TLH/BSO pelvic lymphadenectomy and SLN biopsy, Pelvic washings, Omentectomy   08/17/18 - 09/07/18 Vaginal brachytherapy (Dr. Sondra Come) - 5 Fxn 3. Genetic testing negative (VUS in CASR)  HPI: Andrea Hoffman  is a very nice 74 y.o.  P4.    . Interval History  She presents for surveillance today. She denies vaginal bleeding, cough, pain, lethargy, weight loss, abdominal pain, bloating, nausea or vomiting, or changes in bowel or bladder habits  or HA.     . Oncologic Course  She noted postmenopausal bleeding described as spotting a few months prior to presentation. Noted some cramps, otherwise no significant symptoms. She called her PCP to report the bleeding and then was referred to Dr. Octaviano Glow for work-up. A Pap smear was done revealing AGUS with HRHPV negative. A cervical polyp was removed and found to be benign.  05/19/18 - TVUS -  8.7 x 5 x 4cm uterus , with 2 cm Em lining avascular. 2 complex masses in CDS 5.5x5x6cm and 6x6.3x6.4 cm vascular (ovaries versus other).  06/2018 - CT CAPIMPRESSION:1. Thickened irregular endometrium consistent with patient's knownendometrial cancer. I do not see any obvious extension beyondmyometrium.2. Large right adnexal mass could represent a solid ovarian neoplasmor a large exophytic fibroid.MRI may be helpful for furtherevaluation. The left ovary is normal.3. Enhancing 17 mm left medial breast mass. Recommend correlationwith physical exam, diagnostic mammography and ultrasound.4. Numerous small pulmonary nodules are likely benign as above.5.Small pelvic lymph nodes are likely benign.6. Status post right hemicolectomy.7. Cholelithiasis  7/20-CT CHEST  IMPRESSION:    All previously noted 2-4 mm pulmonary nodules  appear stable in size and number compared to the prior  examination from 06/19/2018, which is reassuring.  These are presumably benign. No definite signs of metastatic disease are noted in the thorax.  Followup TVUS showing thickened EM lining and bilateral adnexal masses. Endometrial biopsy  (read by [facility]) revealed grade 1 endometrial adenocarcinoma.   CA125 elevated for the lab at 30.7 (lab normal <21) CEA mildly elevated at 3.3 (lab normal <3 for nonsmokers)  She underwent on 07/01/18 Robotic-assisted TLH/BSO pelvic lymphadenectomy and SLN biopsy, Pelvic washings, Omentectomy (partial) Findings included ; Adhesions of omentum to anterior abdominal wall. Normal sized uterus. Normal appearing left ovary. Right ovary enlarged and firm; bilobed. Frozen on uterus >50% depth of invasion. 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 Sentinel uptake left and right external iliac vessels, both proximal and distal, so 4 total sentinel lymph nodes. Mild adenopathy in right proximal external node. No gross extrapelvic disease    Final pathology revealed: Uterus, cervix and bilateral fallopian tubes, left ovary - ENDOMETRIOID ADENOCARCINOMA, 4.1 CM, FIGO GRADE 1. - CARCINOMA INVADES FOR A DEPTH OF 1.3 CM WHERE MYOMETRIAL THICKNESS IS 2.0 CM (GREATER THAN 50%). - NEGATIVE FOR LYMPHOVASCULAR OR PERINEURAL INVASION. - BENIGN LEIOMYOMATA. - BENIGN CERVICAL POLYP. - BENIGN UNREMARKABLE BILATERAL FALLOPIAN TUBES AND LEFT OVARY. - SEE ONCOLOGY TABLE. - SEE NOTE. 2. 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 - OVARIAN SURFACE IS NOT INVOLVED BY TUMOR. - NO SIGNIFICANT MITOTIC ACTIVITY OR NECROSIS. - SEE ONCOLOGY TABLE. - SEE NOTE. 3. Lymph node, sentinel, biopsy,  right external iliac - LYMPH NODE, NEGATIVE FOR CARCINOMA (0/1). -  IMMUNOHISTOCHEMICAL STAIN FOR PANKERATIN (AE1/AE3) IS NEGATIVE 4. Lymph node, sentinel, biopsy, right distal external iliac - LYMPH NODE, NEGATIVE FOR CARCINOMA (0/1). - IMMUNOHISTOCHEMICAL STAIN FOR PANKERATIN (AE1/AE3) IS NEGATIVE 5. Lymph node, sentinel, biopsy, left external iliac - LYMPH NODE, NEGATIVE FOR CARCINOMA (0/1). - IMMUNOHISTOCHEMICAL STAIN FOR PANKERATIN (AE1/AE3) IS NEGATIVE. 6. Lymph node, sentinel, biopsy, left distal external iliac - LYMPH NODE, NEGATIVE FOR CARCINOMA (0/1). - IMMUNOHISTOCHEMICAL STAIN FOR PANKERATIN (AE1/AE3) IS NEGATIVE 7. Lymph nodes, regional resection, left pelvic - FIVE LYMPH NODES, NEGATIVE FOR CARCINOMA (0/5). 8. Lymph nodes, regional resection, right pelvic - THREE LYMPH NODES, NEGATIVE FOR TUMOR (0/3). 9. Omentum, resection for tumor - PORTION OF OMENTUM, NEGATIVE FOR TUMOR. Microscopic Comment 1. UTERUS, CARCINOMA OR CARCINOSARCOMA Procedure: Total hysterectomy with bilateral salpingo-oophorectomy. Histologic type: Endometrioid adenocarcinoma. Histologic Grade: FIGO grade 1. Myometrial invasion: Present. Depth of invasion: 13 mm. Myometrial thickness: 20 mm. Uterine Serosa Involvement: Not identified. Cervical stromal involvement: Not identified. Extent of involvement of other organs: Not applicable. Lymphovascular invasion: Not identified.  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   She was presented at [facility] (see separate notes in EP[facility]). Agreement wass for radiation (brachy), genetics, and for her breast followup.   Imported EPIC Oncologic History:   Oncology History  Granulosa cell carcinoma of right ovary ([place])  07/10/2018 Initial Diagnosis   Granulosa cell carcinoma of right ovary (HCC)   09/17/2018 Genetic  Testing   CASR c.2301G>A (Silent) VUS identified on the Multi-cancer panel.  The Multi-Gene Panel offered by [facility] includes sequencing and/or deletion duplication testing of the following 84 genes: AIP, ALK, APC, ATM, AXIN2,BAP1,  BARD1, BLM, BMPR1A, BRCA1, BRCA2, BRIP1, CASR, CDC73, CDH1, CDK4, CDKN1B, CDKN1C, CDKN2A (p14ARF), CDKN2A (p16INK4a), CEBPA, CHEK2, CTNNA1, DICER1, DIS3L2, EGFR (c.2369C>T, p.Thr790Met variant only), EPCAM (Deletion/duplication testing only), FH, FLCN, GATA2, GPC3, GREM1 (Promoter region deletion/duplication testing only), HOXB13 (c.251G>A, p.Gly84Glu), HRAS, KIT, MAX, MEN1, MET, MITF (c.952G>A, p.Glu318Lys variant only), MLH1, MSH2, MSH3, MSH6, MUTYH, NBN, NF1, NF2, NTHL1, PALB2, PDGFRA, PHOX2B, PMS2, POLD1, POLE, POT1, PRKAR1A, PTCH1, PTEN, RAD50, RAD51C, RAD51D, RB1, RECQL4, RET, RUNX1, SDHAF2, SDHA (sequence changes only), SDHB, SDHC, SDHD, SMAD4, SMARCA4, SMARCB1, SMARCE1, STK11, SUFU, TERC, TERT, TMEM127, TP53, TSC1, TSC2, VHL, WRN and WT1.  The report date is September 17, 2018.   Endometrial adenocarcinoma ([place])  06/10/2018 Initial Diagnosis   Endometrial adenocarcinoma ([place])   09/17/2018 Genetic Testing   CASR c.2301G>A (Silent) VUS identified on the Multi-cancer panel.  The Multi-Gene Panel offered by [facility] includes sequencing and/or deletion duplication testing of the following 84 genes: AIP, ALK, APC, ATM, AXIN2,BAP1,  BARD1, BLM, BMPR1A, BRCA1, BRCA2, BRIP1, CASR, CDC73, CDH1, CDK4, CDKN1B, CDKN1C, CDKN2A (p14ARF), CDKN2A (p16INK4a), CEBPA, CHEK2, CTNNA1, DICER1, DIS3L2, EGFR (c.2369C>T, p.Thr790Met variant only), EPCAM (Deletion/duplication testing only), FH, FLCN, GATA2, GPC3, GREM1 (Promoter region deletion/duplication testing only), HOXB13 (c.251G>A, p.Gly84Glu), HRAS, KIT, MAX, MEN1, MET, MITF (c.952G>A, p.Glu318Lys variant only), MLH1, MSH2, MSH3, MSH6, MUTYH, NBN, NF1, NF2, NTHL1, PALB2, PDGFRA, PHOX2B, PMS2, POLD1, POLE, POT1, PRKAR1A, PTCH1, PTEN, RAD50,  RAD51C, RAD51D, RB1, RECQL4, RET, RUNX1, SDHAF2, SDHA (sequence changes only), SDHB, SDHC, SDHD, SMAD4, SMARCA4, SMARCB1, SMARCE1, STK11, SUFU, TERC, TERT, TMEM127, TP53, TSC1, TSC2, VHL, WRN and WT1.  The report date  is September 17, 2018.     Measurement of disease:  Inhibin B 06/25/18 - preop 510.6 Recent Labs    05/13/19 1256  CAN125 10.4  INHBB <7.0     Current Meds:  Outpatient Encounter Medications as of 02/02/2020  Medication Sig  . allopurinol (ZYLOPRIM) 100 MG tablet Take 100 mg by mouth daily.  . Alpha-Lipoic Acid 200 MG CAPS Take 200-400 mg by mouth See admin instructions. Take 200 mg by mouth in the morning and take 400 mg by mouth at bedtime  . amLODipine (NORVASC) 10 MG tablet Take 10 mg by mouth daily.   . carvedilol (COREG) 6.25 MG tablet Take 6.25 mg by mouth 2 (two) times daily with a meal.   . citalopram (CELEXA) 40 MG tablet Take 0.5 tablets (20 mg total) by mouth at bedtime. Take 20 mg once daily  . glucose blood (ONE TOUCH ULTRA TEST) test strip   . hydrochlorothiazide (HYDRODIURIL) 25 MG tablet Take 25 mg by mouth daily.   Marland Kitchen lisinopril (PRINIVIL,ZESTRIL) 20 MG tablet Take 20 mg by mouth daily.   . metFORMIN (GLUCOPHAGE) 500 MG tablet Take 1 tablet (500 mg total) by mouth 2 (two) times daily with a meal. Take one tablet (500 mg total) in the am and 500 mg in the pm until seen in the office for repeat creatinine level.  [name] DELICA LANCETS 60F MISC   . ONETOUCH VERIO test strip USE STRIP TO CHECK GLUCOSE AS DIRECTED ONCE DAILY IN THE MORNING  . simvastatin (ZOCOR) 40 MG tablet Take 0.5 tablets (20 mg total) by mouth at bedtime. Until you see your primary care doctor  . [DISCONTINUED] nitroGLYCERIN (NITROSTAT) 0.4 MG SL tablet Place 0.4 mg under the tongue every 5 (five) minutes as needed for chest pain.    No facility-administered encounter medications on file as of 02/02/2020.    Allergy:  Allergies  Allergen Reactions  . Hydrocodone Other (See Comments)     Hallucinations    Past Surgical Hx:  Past Surgical History:  Procedure Laterality Date  . AMPUTATION TOE  2014   2 toes amputated  . COLONOSCOPY    . LAPAROTOMY WITH STAGING N/A 07/01/2018   Procedure: POSSIBLE LAPAROTOMY WITH STAGING;  Surgeon: [name], MD;  Location: [facility];  Service: Gynecology;  Laterality: N/A;  . PARTIAL COLECTOMY  2002 or 2003   Colon cancer [facility] - "took 18-22 inches"  . ROBOTIC ASSISTED TOTAL HYSTERECTOMY WITH BILATERAL SALPINGO OOPHERECTOMY Bilateral 07/01/2018   Procedure: XI ROBOTIC ASSISTED TOTAL HYSTERECTOMY WITH BILATERAL SALPINGO OOPHORECTOMY;  Surgeon: [name], MD;  Location: [facility];  Service: Gynecology;  Laterality: Bilateral;  . SENTINEL NODE BIOPSY N/A 07/01/2018   Procedure: SENTINEL NODE BIOPSY;  Surgeon: [name], MD;  Location: [facility];  Service: Gynecology;  Laterality: N/A;  . TONSILLECTOMY     Patient had her tonsils taken out at age 48  . TUBAL LIGATION  1974  . UPPER GI ENDOSCOPY      Past Medical Hx:  Past Medical History:  Diagnosis Date  . Anemia   . Broken arm    Right shoulder  . Bunion   . Colon cancer ([place])    surgery followed by chemo for "6 months"  . Cyanocobalamin deficiency   . Depression   . Diabetes mellitus without complication ([place])   . Diabetic neuropathy ([place])   . Dyspnea    with exertion  . Endometrial cancer ([place])   . Hammer toe   .  High cholesterol   . History of pneumonia   . Hypertension   . Migraines    history of  . Pulmonary nodules    Numerous small  . Rheumatic fever    age 5    Past Gynecological History:   GYNECOLOGIC HISTORY:  No LMP recorded. Patient is postmenopausal. Menarche: 74 years old P 4 LMP 20 years ago, then again with referral diagnosis Contraceptive never HRT 1 year  Last Pap  Referral Pap 05/2018 AGUS with negative HRHPV  Family Hx:  Family History  Problem Relation Age of Onset  . Heart attack Father        d. 29  . Diabetes  Brother   . Colon polyps Brother   . Diabetes Maternal Grandmother        d. 7  . Lung cancer Mother   . Breast cancer Mother 55       ? bony mets; d. 61  . Breast cancer Maternal Aunt        dx over 101  . Colon cancer Maternal Uncle        dx over 35  . Breast cancer Maternal Aunt        dx over 46  . Cancer Cousin        mat first cousin; unknown type in their 50's  . Cancer Cousin        mat first cousin; Lung in their 34's  . Colon cancer Maternal Uncle        dx over 42  . COPD Maternal Grandfather   . Other Paternal Grandmother        childbirth bed fever, d. 46  . Breast cancer Cousin        pat first cousin d. 22s    Social Hx:  Tobacco use: never Alcohol use: never Illicit Drug use: denies Illicit IV Drug use: denies  Review of Systems:  Review of Systems  Constitutional: Negative for chills, fever, malaise/fatigue and weight loss.  HENT: Negative for congestion and sinus pain.   Eyes: Negative for blurred vision and pain.  Respiratory: Negative for cough, sputum production and shortness of breath.   Cardiovascular: Negative for leg swelling and PND.  Gastrointestinal: Negative for abdominal pain, blood in stool, constipation, diarrhea, heartburn, nausea and vomiting.  Genitourinary: Negative for dysuria, frequency and urgency.       No vaginal bleeding  Musculoskeletal: Negative.   Skin: Negative for itching and rash.  Neurological: Negative.      Vitals:  Vitals:   02/02/20 1142  BP: 134/66  Resp: 17  Temp: 98.5 F (36.9 C)  SpO2: 98%   Body mass index is 35.08 kg/m.   Physical Exam:  ECOG PERFORMANCE STATUS: 0 - Asymptomatic  Physical Exam  Constitutional: She is well-developed, well-nourished, and in no distress. No distress.  Abdominal: Soft. She exhibits no mass. There is no abdominal tenderness.  Genitourinary:    Vulval erythema present.     No lesions in the vagina.     Genitourinary Comments: No palpable abnormality    Lymphadenopathy:       Right: Inguinal adenopathy present. No supraclavicular adenopathy present.       Left: No inguinal and no supraclavicular adenopathy present.      Assessment Problem List Items Addressed This Visit    Granulosa cell carcinoma of right ovary (HCC)    Endometrial adenocarcinoma ([place]) - Primary Stage IB, G1 Endometrial cancer     Negative symptom review, normal exam  Plan: 1. Review the tumor markers 2. The patient prefers to return in 6 mths  I personally spent 25 minutes face-to-face and non-face-to-face in the care of this patient, which includes all pre, intra, and post visit time on the date of service.  Lahoma Crocker, MD  02/02/2020, 11:57 AM    Cc: Ashby Dawes, DO (Referring Ob/Gyn) Cecille Amsterdam, DO (PCP) Gery Pray, MD (Radiation Oncologist)

## 2020-02-02 NOTE — Patient Instructions (Signed)
Return 6 months

## 2020-02-03 ENCOUNTER — Telehealth: Payer: Self-pay

## 2020-02-03 LAB — INHIBIN B: Inhibin B: 10.5 pg/mL (ref 0.0–16.9)

## 2020-02-03 LAB — CA 125: Cancer Antigen (CA) 125: 12 U/mL (ref 0.0–38.1)

## 2020-02-03 NOTE — Telephone Encounter (Signed)
Told Mr. Koskela that Mrs. Monceaux's CA-125 was good at 12.0 yesterday. He will let his wife know the good news as she was not currently at home.

## 2020-02-03 NOTE — Telephone Encounter (Signed)
Told Ms Brandell that the Inhibin B was 10.5 which is WNL per Melissa Cross,NP.

## 2020-07-04 ENCOUNTER — Telehealth: Payer: Self-pay | Admitting: *Deleted

## 2020-07-04 NOTE — Telephone Encounter (Signed)
Returned the patient's call and scheduled lab/follow up appt for October

## 2020-09-05 ENCOUNTER — Other Ambulatory Visit: Payer: Self-pay | Admitting: Gynecologic Oncology

## 2020-09-05 DIAGNOSIS — C561 Malignant neoplasm of right ovary: Secondary | ICD-10-CM

## 2020-09-05 NOTE — Assessment & Plan Note (Signed)
Negative symptom review, normal exam.  Normal tumor markers.  No evidence of recurrence  >review the repeat tumor markers >return in 6 mths

## 2020-09-05 NOTE — Progress Notes (Signed)
Follow Up Note: Gyn-Onc  Andrea Hoffman 74 y.o. 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  The Multi-Gene Panel offered by [facility] includes sequencing and/or deletion duplication testing of the following 84 genes: AIP, ALK, APC, ATM, AXIN2,BAP1,  BARD1, BLM, BMPR1A, BRCA1, BRCA2, BRIP1, CASR, CDC73, CDH1, CDK4, CDKN1B, CDKN1C, CDKN2A (p14ARF), CDKN2A (p16INK4a), CEBPA, CHEK2, CTNNA1, DICER1, DIS3L2, EGFR (c.2369C>T, p.Thr790Met variant only), EPCAM (Deletion/duplication testing only), FH, FLCN, GATA2, GPC3, GREM1 (Promoter region deletion/duplication testing only), HOXB13 (c.251G>A, p.Gly84Glu), HRAS, KIT, MAX, MEN1, MET, MITF (c.952G>A, p.Glu318Lys variant only), MLH1, MSH2, MSH3, MSH6, MUTYH, NBN, NF1, NF2, NTHL1, PALB2, PDGFRA, PHOX2B, PMS2, POLD1, POLE, POT1, PRKAR1A, PTCH1, PTEN, RAD50, RAD51C, RAD51D, RB1, RECQL4, RET, RUNX1, SDHAF2, SDHA (sequence changes only), SDHB, SDHC, SDHD, SMAD4, SMARCA4, SMARCB1, SMARCE1, STK11, SUFU, TERC, TERT, TMEM127, TP53, TSC1, TSC2, VHL, WRN and WT1.  The report date is September 17, 2018.   02/02/2020 Tumor Marker   Inhibin B: 10.5 CA 125: 12.0     Cancer Staging   1. Stage IA Granulosa Cell Tumor of the left ovary 1. Stage IB, Grade 1 Endometrioid Endometrial Adenocarcinoma    Endometrial adenocarcinoma ([place])  06/10/2018 Initial Diagnosis   Endometrial adenocarcinoma ([place])   07/01/2018 Surgery    Robotic-assisted TLH/BSO pelvic lymphadenectomy  and SLN biopsy, Pelvic washings, Omentectomy    08/17/2018 - 09/07/2018 Radiation Therapy    Vaginal brachytherapy    09/17/2018 Genetic Testing   CASR c.2301G>A (Silent) VUS identified on the Multi-cancer panel.  The Multi-Gene Panel offered by [facility] includes sequencing and/or deletion duplication testing of the following 84 genes: AIP, ALK, APC, ATM, AXIN2,BAP1,  BARD1, BLM, BMPR1A, BRCA1, BRCA2, BRIP1, CASR, CDC73, CDH1, CDK4, CDKN1B, CDKN1C, CDKN2A (p14ARF), CDKN2A (p16INK4a), CEBPA, CHEK2, CTNNA1, DICER1, DIS3L2, EGFR (c.2369C>T, p.Thr790Met variant only), EPCAM (Deletion/duplication testing only), FH, FLCN, GATA2, GPC3, GREM1 (Promoter region deletion/duplication testing only), HOXB13 (c.251G>A, p.Gly84Glu), HRAS, KIT, MAX, MEN1, MET, MITF (c.952G>A, p.Glu318Lys variant only), MLH1, MSH2, MSH3, MSH6, MUTYH, NBN, NF1, NF2, NTHL1, PALB2, PDGFRA, PHOX2B, PMS2, POLD1, POLE, POT1, PRKAR1A, PTCH1, PTEN, RAD50, RAD51C, RAD51D, RB1, RECQL4, RET, RUNX1, SDHAF2, SDHA (sequence changes only), SDHB, SDHC, SDHD, SMAD4, SMARCA4, SMARCB1, SMARCE1, STK11, SUFU, TERC, TERT, TMEM127, TP53, TSC1, TSC2, VHL, WRN and WT1.  The report date is September 17, 2018.   02/02/2020 Tumor Marker   Inhibin B: 10.5 CA 125: 12.0     Cancer Staging   1. Stage IA Granulosa Cell Tumor of the left ovary 1. Stage IB, Grade 1 Endometrioid Endometrial Adenocarcinoma      Interval History:  She denies vaginal bleeding, cough, lethargy, weight loss, abdominal pain, bloating, nausea or vomiting, or changes in bowel or bladder habits  or HA.  Review of Systems Review of Systems  Constitutional: Negative for malaise/fatigue and weight loss.  Respiratory: Negative for cough.   Gastrointestinal: Negative for abdominal pain, nausea and vomiting.  Genitourinary: Negative for dysuria, frequency and urgency.       Negative for vaginal bleeding  Neurological: Negative for headaches.     Current Meds:  Outpatient Encounter  Medications  as of 09/06/2020  Medication Sig   allopurinol (ZYLOPRIM) 100 MG tablet Take 100 mg by mouth daily.   Alpha-Lipoic Acid 200 MG CAPS Take 200-400 mg by mouth See admin instructions. Take 200 mg by mouth in the morning and take 400 mg by mouth at bedtime   amLODipine (NORVASC) 10 MG tablet Take 10 mg by mouth daily.    carvedilol (COREG) 6.25 MG tablet Take 6.25 mg by mouth 2 (two) times daily with a meal.    citalopram (CELEXA) 40 MG tablet Take 0.5 tablets (20 mg total) by mouth at bedtime. Take 20 mg once daily   glucose blood (ONE TOUCH ULTRA TEST) test strip    hydrochlorothiazide (HYDRODIURIL) 25 MG tablet Take 25 mg by mouth daily.    lisinopril (PRINIVIL,ZESTRIL) 20 MG tablet Take 20 mg by mouth daily.    metFORMIN (GLUCOPHAGE) 500 MG tablet Take 1 tablet (500 mg total) by mouth 2 (two) times daily with a meal. Take one tablet (500 mg total) in the am and 500 mg in the pm until seen in the office for repeat creatinine level.   ONETOUCH DELICA LANCETS 82X MISC    ONETOUCH VERIO test strip USE STRIP TO CHECK GLUCOSE AS DIRECTED ONCE DAILY IN THE MORNING   simvastatin (ZOCOR) 40 MG tablet Take 0.5 tablets (20 mg total) by mouth at bedtime. Until you see your primary care doctor   No facility-administered encounter medications on file as of 09/06/2020.    Allergy:  Allergies  Allergen Reactions   Hydrocodone Other (See Comments)    Hallucinations    Social Hx:   Social History   Socioeconomic History   Marital status: Married    Spouse name: Not on file   Number of children: Not on file   Years of education: Not on file   Highest education level: Not on file  Occupational History   Not on file  Tobacco Use   Smoking status: Never Smoker   Smokeless tobacco: Never Used  Vaping Use   Vaping Use: Never used  Substance and Sexual Activity   Alcohol use: Never   Drug use: Never   Sexual activity: Not Currently    Birth control/protection:  Post-menopausal  Other Topics Concern   Not on file  Social History Narrative   Not on file   Social Determinants of Health   Financial Resource Strain:    Difficulty of Paying Living Expenses: Not on file  Food Insecurity:    Worried About [place] in the Last Year: Not on file   Ran Out of Food in the Last Year: Not on file  Transportation Needs:    Lack of Transportation (Medical): Not on file   Lack of Transportation (Non-Medical): Not on file  Physical Activity:    Days of Exercise per Week: Not on file   Minutes of Exercise per Session: Not on file  Stress:    Feeling of Stress : Not on file  Social Connections:    Frequency of Communication with Friends and Family: Not on file   Frequency of Social Gatherings with Friends and Family: Not on file   Attends Religious Services: Not on file   Active Member of Clubs or Organizations: Not on file   Attends Archivist Meetings: Not on file   Marital Status: Not on file  Intimate Partner Violence:    Fear of Current or Ex-Partner: Not on file   Emotionally Abused: Not on file  Physically Abused: Not on file   Sexually Abused: Not on file    Past Surgical Hx:  Past Surgical History:  Procedure Laterality Date   AMPUTATION TOE  2014   2 toes amputated   COLONOSCOPY     LAPAROTOMY WITH STAGING N/A 07/01/2018   Procedure: POSSIBLE LAPAROTOMY WITH STAGING;  Surgeon: [name], MD;  Location: [facility];  Service: Gynecology;  Laterality: N/A;   PARTIAL COLECTOMY  2002 or 2003   Colon cancer [facility] - "took 18-22 inches"   ROBOTIC ASSISTED TOTAL HYSTERECTOMY WITH BILATERAL SALPINGO OOPHERECTOMY Bilateral 07/01/2018   Procedure: XI ROBOTIC ASSISTED TOTAL HYSTERECTOMY WITH BILATERAL SALPINGO OOPHORECTOMY;  Surgeon: [name], MD;  [facility];  Service: Gynecology;  Laterality: Bilateral;   SENTINEL NODE BIOPSY N/A 07/01/2018   Procedure: SENTINEL NODE BIOPSY;   Surgeon: [name], MD;  [facility];  Service: Gynecology;  Laterality: N/A;   TONSILLECTOMY     Patient had her tonsils taken out at age 40   TUBAL LIGATION  1974   UPPER GI ENDOSCOPY      Past Medical Hx:  Past Medical History:  Diagnosis Date   Anemia    Broken arm    Right shoulder   Bunion    Colon cancer ([place])    surgery followed by chemo for "6 months"   Cyanocobalamin deficiency    Depression    Diabetes mellitus without complication ([place])    Diabetic neuropathy ([place])    Dyspnea    with exertion   Endometrial cancer ([place])    Hammer toe    High cholesterol    History of pneumonia    Hypertension    Migraines    history of   Pulmonary nodules    Numerous small   Rheumatic fever    age 11    Family Hx:  Family History  Problem Relation Age of Onset   Heart attack Father        d. 28   Diabetes Brother    Colon polyps Brother    Diabetes Maternal Grandmother        d. 59   Lung cancer Mother    Breast cancer Mother 62       ? bony mets; d. 27   Breast cancer Maternal Aunt        dx over 48   Colon cancer Maternal Uncle        dx over 64   Breast cancer Maternal Aunt        dx over 13   Cancer Cousin        mat first cousin; unknown type in their 61's   Cancer Cousin        mat first cousin; Lung in their 35's   Colon cancer Maternal Uncle        dx over 47   COPD Maternal Grandfather    Other Paternal Grandmother        childbirth bed fever, d. 73   Breast cancer Cousin        pat first cousin d. 71s    Vitals: BP 127/66 (BP Location: Left Arm, Patient Position: Sitting)    Pulse 88    Temp 98.9 F (37.2 C) (Tympanic)    Resp 20    Ht _0  (1.676 m)    Wt 210 lb (95.3 kg)    SpO2 98% Comment: RA   BMI 33.89 kg/m   Physical Exam: Physical Exam  Abdominal:     General: There is no distension.     Palpations: Abdomen is soft. There is no mass.     Tenderness: There is no abdominal tenderness.   Genitourinary:    General: Normal vulva.     Vagina: No vaginal discharge, bleeding or lesions.     Adnexa:        Right: No tenderness.         Left: No mass or tenderness.    Musculoskeletal:     Right lower leg: No edema.     Left lower leg: No edema.  Lymphadenopathy:     Upper Body:     Right upper body: No supraclavicular adenopathy.     Left upper body: No supraclavicular adenopathy.     Lower Body: No right inguinal adenopathy. No left inguinal adenopathy.  Neurological:     Mental Status: She is alert.     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  Normal tumor markers.  No evidence of recurrence  >review the repeat tumor markers >return in 6 mths    I personally spent 25 minutes face-to-face and non-face-to-face in the care of this patient, which includes all pre, intra, and post visit time on the date of service.   Lahoma Crocker, MD  09/05/2020, 1:59 PM

## 2020-09-06 ENCOUNTER — Inpatient Hospital Stay: Payer: Medicare HMO

## 2020-09-06 ENCOUNTER — Other Ambulatory Visit: Payer: Self-pay

## 2020-09-06 ENCOUNTER — Encounter: Payer: Self-pay | Admitting: Obstetrics & Gynecology

## 2020-09-06 ENCOUNTER — Inpatient Hospital Stay: Payer: Medicare HMO | Attending: Obstetrics & Gynecology | Admitting: Obstetrics & Gynecology

## 2020-09-06 VITALS — BP 127/66 | HR 88 | Temp 98.9°F | Resp 20 | Ht 66.0 in | Wt 210.0 lb

## 2020-09-06 DIAGNOSIS — Z79899 Other long term (current) drug therapy: Secondary | ICD-10-CM | POA: Diagnosis not present

## 2020-09-06 DIAGNOSIS — C541 Malignant neoplasm of endometrium: Secondary | ICD-10-CM | POA: Insufficient documentation

## 2020-09-06 DIAGNOSIS — Z7984 Long term (current) use of oral hypoglycemic drugs: Secondary | ICD-10-CM | POA: Insufficient documentation

## 2020-09-06 DIAGNOSIS — I10 Essential (primary) hypertension: Secondary | ICD-10-CM | POA: Diagnosis not present

## 2020-09-06 DIAGNOSIS — Z923 Personal history of irradiation: Secondary | ICD-10-CM | POA: Diagnosis not present

## 2020-09-06 DIAGNOSIS — Z90722 Acquired absence of ovaries, bilateral: Secondary | ICD-10-CM | POA: Insufficient documentation

## 2020-09-06 DIAGNOSIS — C561 Malignant neoplasm of right ovary: Secondary | ICD-10-CM | POA: Insufficient documentation

## 2020-09-06 DIAGNOSIS — F329 Major depressive disorder, single episode, unspecified: Secondary | ICD-10-CM | POA: Insufficient documentation

## 2020-09-06 DIAGNOSIS — Z9221 Personal history of antineoplastic chemotherapy: Secondary | ICD-10-CM | POA: Insufficient documentation

## 2020-09-06 DIAGNOSIS — Z90710 Acquired absence of both cervix and uterus: Secondary | ICD-10-CM | POA: Diagnosis not present

## 2020-09-06 DIAGNOSIS — E119 Type 2 diabetes mellitus without complications: Secondary | ICD-10-CM | POA: Diagnosis not present

## 2020-09-06 NOTE — Patient Instructions (Addendum)
Return in 6 months. Call the office at [phone] in February 2022 to scheduled an appointment for April 2022 with Dr. Delsa Sale.  The office will call you with the lab results when available.

## 2020-09-07 LAB — CA 125: Cancer Antigen (CA) 125: 10.8 U/mL (ref 0.0–38.1)

## 2020-09-12 LAB — INHIBIN B: Inhibin B: <7 pg/mL (ref 0.0–16.9)

## 2020-09-13 ENCOUNTER — Telehealth: Payer: Self-pay

## 2020-09-13 NOTE — Telephone Encounter (Signed)
Told Ms Sprenkle that the tumor markers are stable and  WNL per Joylene John, NP. Pt verbalized understanding.

## 2020-09-14 ENCOUNTER — Telehealth: Payer: Self-pay

## 2020-09-14 NOTE — Telephone Encounter (Signed)
ENCOUNTER OPENED IN ERROR

## 2021-01-24 ENCOUNTER — Telehealth: Payer: Self-pay | Admitting: *Deleted

## 2021-01-24 NOTE — Telephone Encounter (Signed)
Returned the patient's call and scheduled a follow up appt for 4/6 at 11 am with Dr Delsa Sale

## 2021-02-14 IMAGING — CT CT CHEST WITH CONTRAST
2 of 4 series · 15 of 36 positions shown, 18 images · IV contrast (OMNIPAQUE)
Comparison: Chest CT 06/19/2018.

CLINICAL DATA: 72-year-old female with history of endometrial
carcinoma diagnosed in 0645. Follow-up study.

EXAM:
CT CHEST WITH CONTRAST
TECHNIQUE: Multidetector CT imaging of the chest was performed during
intravenous contrast administration.
CONTRAST:  75mL OMNIPAQUE IOHEXOL 300 MG/ML  SOLN

[Series 2: axial st · axial · 0.77mm/px · z∈[-345,-47]mm · 12 of 175 slices shown, 15 images]
[im 13/175  mediastinal]
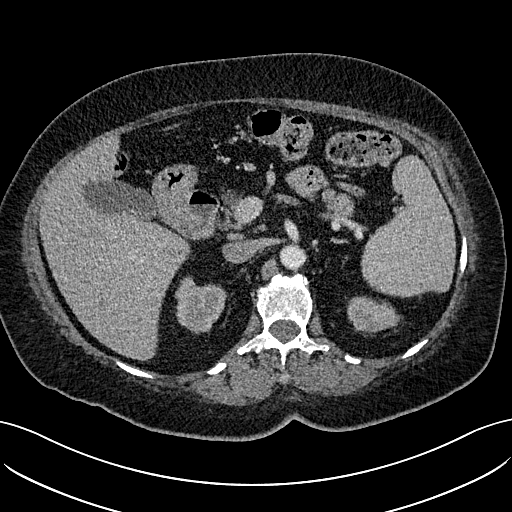
[im 13/175  lung]
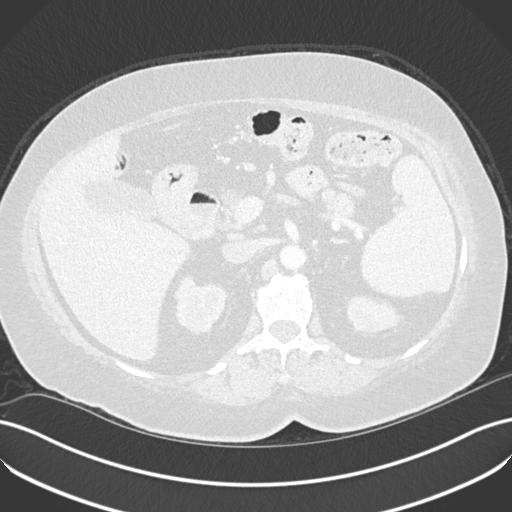
[im 25/175  lung]
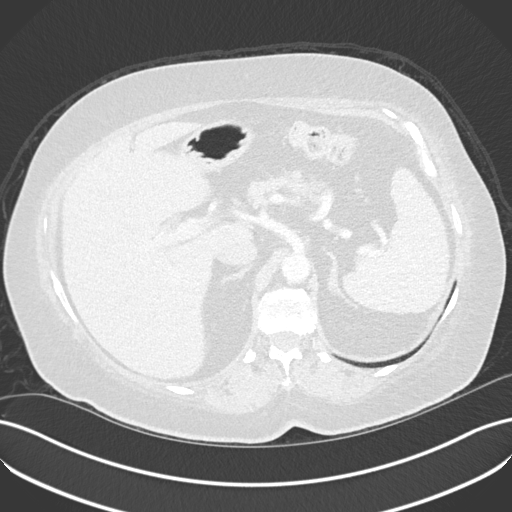
[im 38/175  lung]
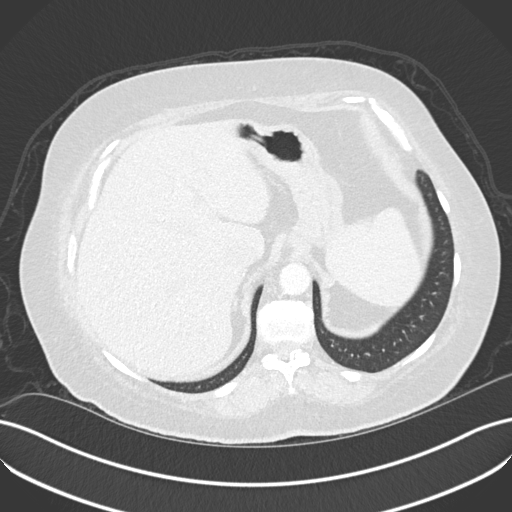
[im 50/175  lung]
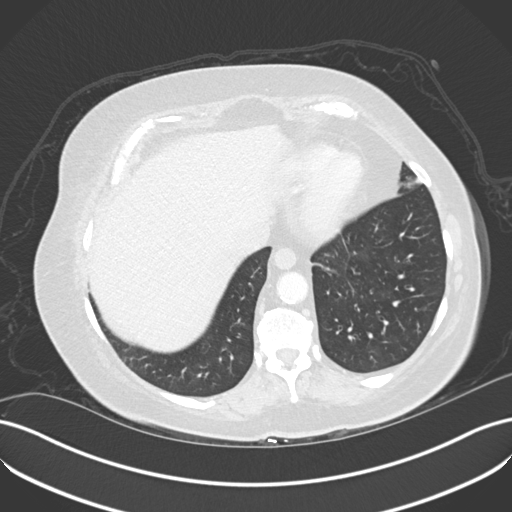
[im 63/175  mediastinal]
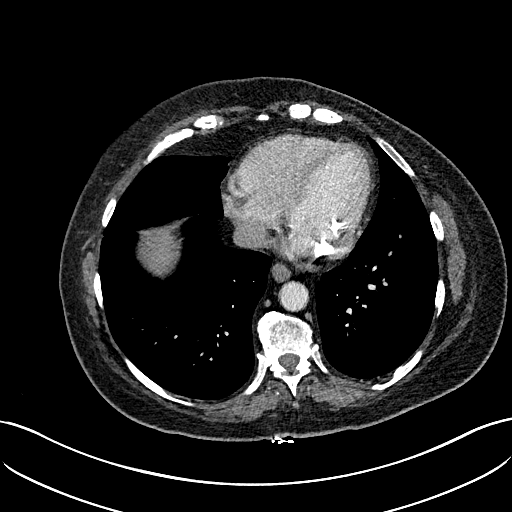
[im 63/175  lung]
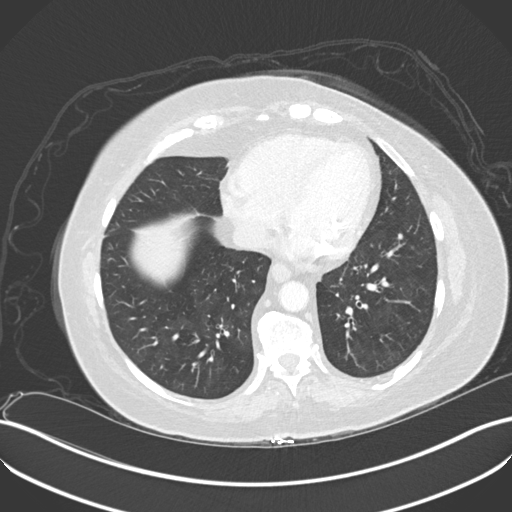
[im 75/175  lung]
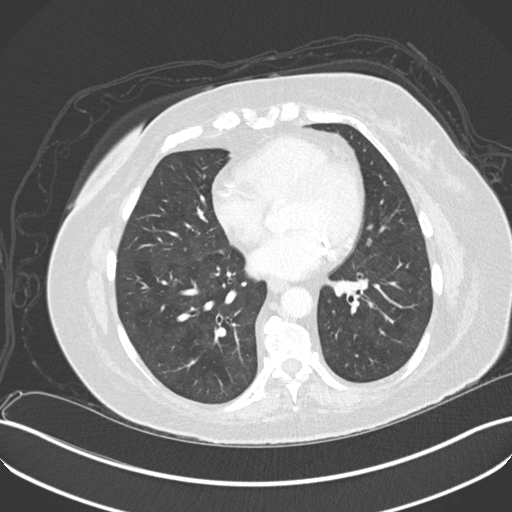
[im 100/175  lung]
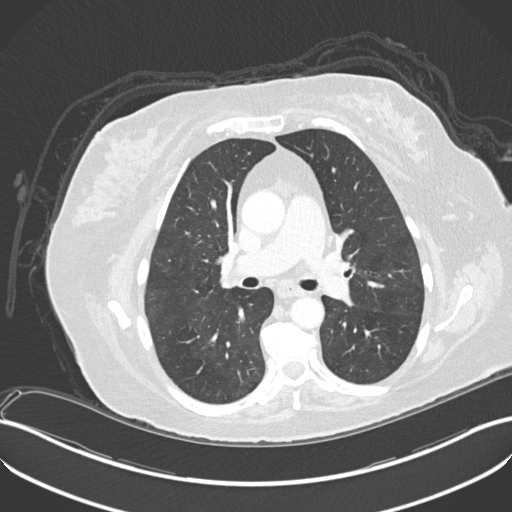
[im 112/175  lung]
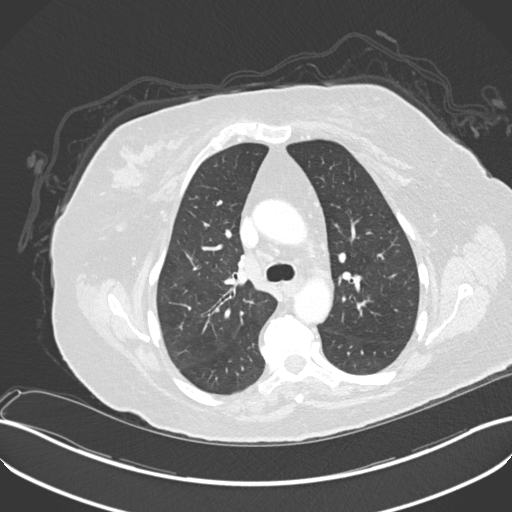
[im 125/175  mediastinal]
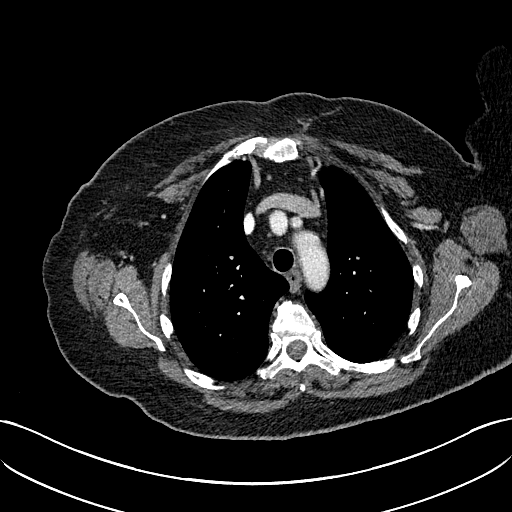
[im 125/175  lung]
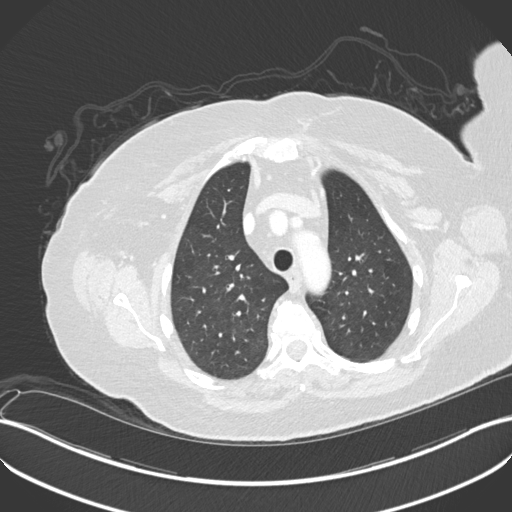
[im 137/175  lung]
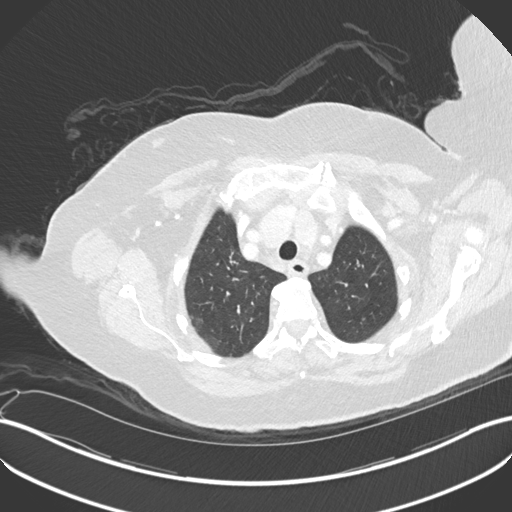
[im 150/175  lung]
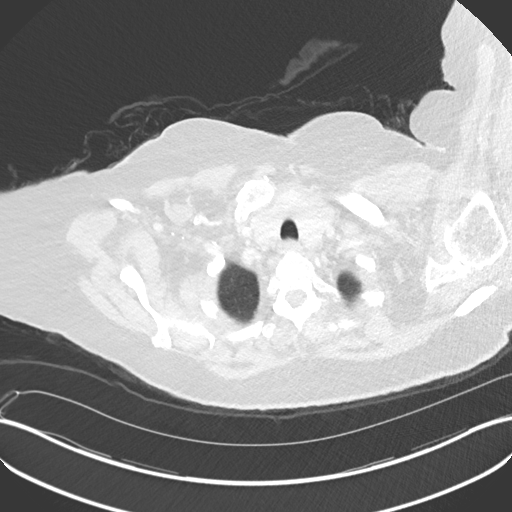
[im 162/175  lung]
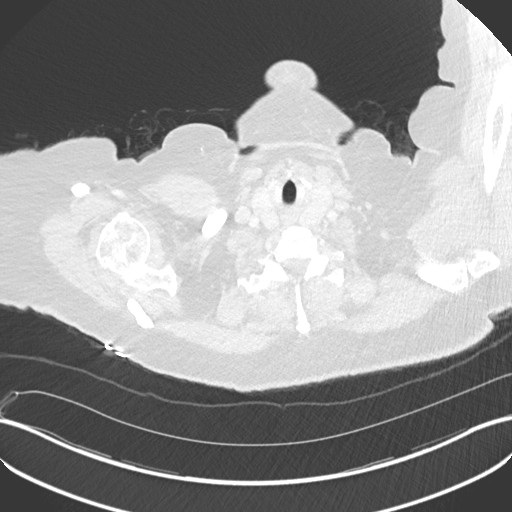

[Series 5: coronal · coronal · 0.69mm/px · 3 of 156 slices shown]
[im 32/156  lung]
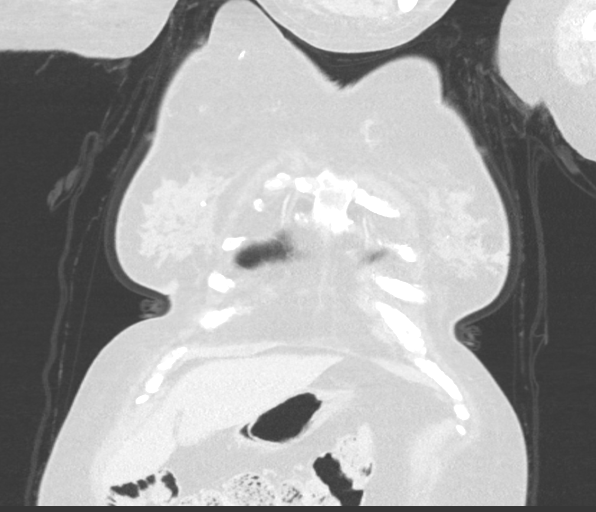
[im 63/156  lung]
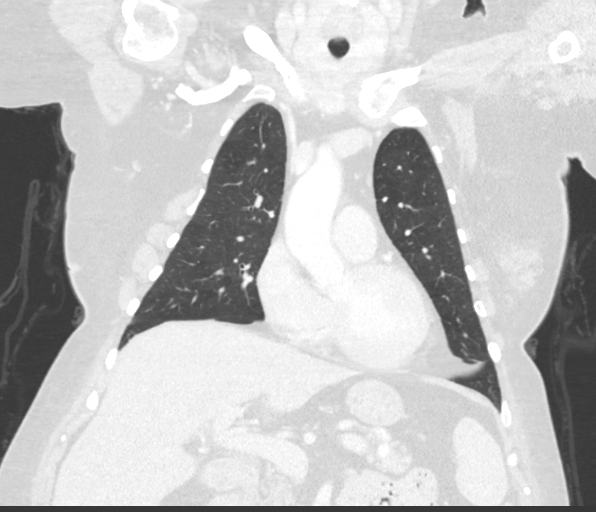
[im 94/156  lung]
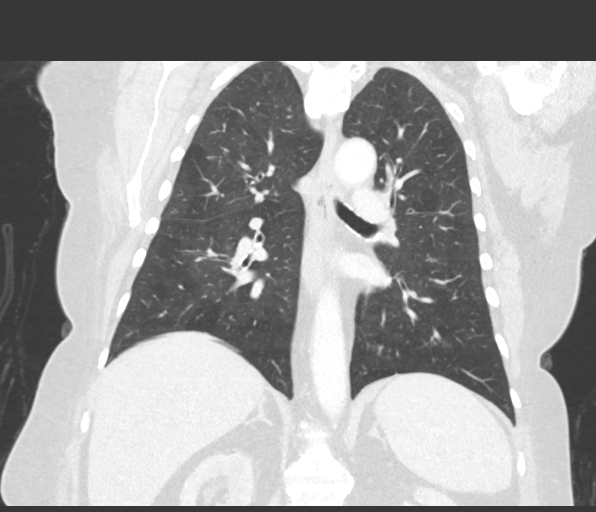

[15 of 36 positions shown; findings below may reference images not displayed]

FINDINGS: Cardiovascular: Heart size is normal. There is no significant
pericardial fluid, thickening or pericardial calcification. There is
aortic atherosclerosis, as well as atherosclerosis of the great
vessels of the mediastinum and the coronary arteries, including
calcified atherosclerotic plaque in the left anterior descending,
left circumflex and right coronary arteries. Severe calcifications
of the mitral annulus and mitral subvalvular apparatus.

Mediastinum/Nodes: No pathologically enlarged mediastinal or hilar
lymph nodes. Esophagus is unremarkable in appearance. No axillary
lymphadenopathy. Peripherally calcified 1.8 x 1.2 cm centrally
intermediate attenuation lesion in the right lobe of the thyroid
gland, stable compared to the prior study (nonspecific).

Lungs/Pleura: Multiple tiny 2-4 mm pulmonary nodules scattered
throughout the lungs bilaterally, stable in size, number and
distribution compared to the prior study. No other new suspicious
appearing pulmonary nodules or masses are noted. No acute
consolidative airspace disease. No pleural effusions.

Upper Abdomen: Calcified granuloma in segment 8 of the liver near
the dome. Aortic atherosclerosis.

Musculoskeletal: There are no aggressive appearing lytic or blastic
lesions noted in the visualized portions of the skeleton.
IMPRESSION: 1. All previously noted 2-4 mm pulmonary nodules appear stable in
size and number compared to the prior examination from 06/19/2018,
which is reassuring. These are presumably benign. No definite signs
of metastatic disease are noted in the thorax.
2. Aortic atherosclerosis, in addition to three-vessel coronary
artery disease. Assessment for potential risk factor modification,
dietary therapy or pharmacologic therapy may be warranted, if
clinically indicated.
3. There are calcifications of the mitral annulus and mitral
subvalvular apparatus. Echocardiographic correlation for evaluation
of potential valvular dysfunction may be warranted if clinically
indicated.
4. Additional incidental findings, as above.

## 2021-03-06 NOTE — Assessment & Plan Note (Signed)
Negative symptom review, normal exam.  Normal al tumor markers.  No evidence of recurrence.   >review the repeat tumor markers >return in 6 mths

## 2021-03-06 NOTE — Progress Notes (Signed)
Follow Up Note: Gyn-Onc  Andrea Hoffman 75 y.o. 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  The Multi-Gene Panel offered by [facility] includes sequencing and/or deletion duplication testing of the following 84 genes: AIP, ALK, APC, ATM, AXIN2,BAP1,  BARD1, BLM, BMPR1A, BRCA1, BRCA2, BRIP1, CASR, CDC73, CDH1, CDK4, CDKN1B, CDKN1C, CDKN2A (p14ARF), CDKN2A (p16INK4a), CEBPA, CHEK2, CTNNA1, DICER1, DIS3L2, EGFR (c.2369C>T, p.Thr790Met variant only), EPCAM (Deletion/duplication testing only), FH, FLCN, GATA2, GPC3, GREM1 (Promoter region deletion/duplication testing only), HOXB13 (c.251G>A, p.Gly84Glu), HRAS, KIT, MAX, MEN1, MET, MITF (c.952G>A, p.Glu318Lys variant only), MLH1, MSH2, MSH3, MSH6, MUTYH, NBN, NF1, NF2, NTHL1, PALB2, PDGFRA, PHOX2B, PMS2, POLD1, POLE, POT1, PRKAR1A, PTCH1, PTEN, RAD50, RAD51C, RAD51D, RB1, RECQL4, RET, RUNX1, SDHAF2, SDHA (sequence changes only), SDHB, SDHC, SDHD, SMAD4, SMARCA4, SMARCB1, SMARCE1, STK11, SUFU, TERC, TERT, TMEM127, TP53, TSC1, TSC2, VHL, WRN and WT1.  The report date is September 17, 2018.   02/02/2020 Tumor Marker   Inhibin B: 10.5 CA 125: 12.0     Cancer Staging   1. Stage IA Granulosa Cell Tumor of the left ovary 1. Stage IB, Grade 1 Endometrioid Endometrial Adenocarcinoma    09/06/2020 Tumor Marker   Inhibin B:< 7 CA 125: 10.8   Endometrial adenocarcinoma ([place])  06/10/2018 Initial Diagnosis   Endometrial adenocarcinoma ([place])   07/01/2018  Surgery    Robotic-assisted TLH/BSO pelvic lymphadenectomy and SLN biopsy, Pelvic washings, Omentectomy    08/17/2018 - 09/07/2018 Radiation Therapy    Vaginal brachytherapy    09/17/2018 Genetic Testing   CASR c.2301G>A (Silent) VUS identified on the Multi-cancer panel.  The Multi-Gene Panel offered by [facility] includes sequencing and/or deletion duplication testing of the following 84 genes: AIP, ALK, APC, ATM, AXIN2,BAP1,  BARD1, BLM, BMPR1A, BRCA1, BRCA2, BRIP1, CASR, CDC73, CDH1, CDK4, CDKN1B, CDKN1C, CDKN2A (p14ARF), CDKN2A (p16INK4a), CEBPA, CHEK2, CTNNA1, DICER1, DIS3L2, EGFR (c.2369C>T, p.Thr790Met variant only), EPCAM (Deletion/duplication testing only), FH, FLCN, GATA2, GPC3, GREM1 (Promoter region deletion/duplication testing only), HOXB13 (c.251G>A, p.Gly84Glu), HRAS, KIT, MAX, MEN1, MET, MITF (c.952G>A, p.Glu318Lys variant only), MLH1, MSH2, MSH3, MSH6, MUTYH, NBN, NF1, NF2, NTHL1, PALB2, PDGFRA, PHOX2B, PMS2, POLD1, POLE, POT1, PRKAR1A, PTCH1, PTEN, RAD50, RAD51C, RAD51D, RB1, RECQL4, RET, RUNX1, SDHAF2, SDHA (sequence changes only), SDHB, SDHC, SDHD, SMAD4, SMARCA4, SMARCB1, SMARCE1, STK11, SUFU, TERC, TERT, TMEM127, TP53, TSC1, TSC2, VHL, WRN and WT1.  The report date is September 17, 2018.   02/02/2020 Tumor Marker   Inhibin B: 10.5 CA 125: 12.0     Cancer Staging   1. Stage IA Granulosa Cell Tumor of the left ovary 1. Stage IB, Grade 1 Endometrioid Endometrial Adenocarcinoma      Interval History:  She denies vaginal bleeding, cough, lethargy, weight loss, abdominal pain, bloating, nausea or vomiting, or changes in bowel or bladder habits  or HA.  Review of Systems Review of Systems  Constitutional: Negative for malaise/fatigue and weight loss.  Respiratory: Negative for cough.   Gastrointestinal: Negative for abdominal pain, nausea and vomiting.  Genitourinary: Negative for dysuria, frequency and urgency.       Negative for vaginal bleeding  Neurological: Negative  for  headaches.     Current Meds:  Outpatient Encounter Medications as of 03/07/2021  Medication Sig  . allopurinol (ZYLOPRIM) 100 MG tablet Take 100 mg by mouth daily.  . Alpha-Lipoic Acid 200 MG CAPS Take 200-400 mg by mouth See admin instructions. Take 200 mg by mouth in the morning and take 400 mg by mouth at bedtime  . amLODipine (NORVASC) 10 MG tablet Take 10 mg by mouth daily.   . carvedilol (COREG) 6.25 MG tablet Take 6.25 mg by mouth 2 (two) times daily with a meal.   . citalopram (CELEXA) 40 MG tablet Take 0.5 tablets (20 mg total) by mouth at bedtime. Take 20 mg once daily  . glucose blood (ONE TOUCH ULTRA TEST) test strip   . hydrochlorothiazide (HYDRODIURIL) 25 MG tablet Take 25 mg by mouth daily.   Marland Kitchen lisinopril (PRINIVIL,ZESTRIL) 20 MG tablet Take 20 mg by mouth daily.   . metFORMIN (GLUCOPHAGE) 1000 MG tablet Take 1,000 mg by mouth 2 (two) times daily.  [name] DELICA LANCETS 84T MISC   . ONETOUCH VERIO test strip USE STRIP TO CHECK GLUCOSE AS DIRECTED ONCE DAILY IN THE MORNING  . simvastatin (ZOCOR) 20 MG tablet Take 20 mg by mouth at bedtime.   No facility-administered encounter medications on file as of 03/07/2021.    Allergy:  Allergies  Allergen Reactions  . Hydrocodone Other (See Comments)    Hallucinations    Social Hx:   Social History   Socioeconomic History  . Marital status: Married    Spouse name: Not on file  . Number of children: Not on file  . Years of education: Not on file  . Highest education level: Not on file  Occupational History  . Not on file  Tobacco Use  . Smoking status: Never Smoker  . Smokeless tobacco: Never Used  Vaping Use  . Vaping Use: Never used  Substance and Sexual Activity  . Alcohol use: Never  . Drug use: Never  . Sexual activity: Not Currently    Birth control/protection: Post-menopausal  Other Topics Concern  . Not on file  Social History Narrative  . Not on file   Social Determinants of Health   Financial  Resource Strain: Not on file  Food Insecurity: Not on file  Transportation Needs: Not on file  Physical Activity: Not on file  Stress: Not on file  Social Connections: Not on file  Intimate Partner Violence: Not on file    Past Surgical Hx:  Past Surgical History:  Procedure Laterality Date  . AMPUTATION TOE  2014   2 toes amputated  . COLONOSCOPY    . LAPAROTOMY WITH STAGING N/A 07/01/2018   Procedure: POSSIBLE LAPAROTOMY WITH STAGING;  Surgeon: [name], MD;  [facility];  Service: Gynecology;  Laterality: N/A;  . PARTIAL COLECTOMY  2002 or 2003   Colon cancer [facility] - "took 18-22 inches"  . ROBOTIC ASSISTED TOTAL HYSTERECTOMY WITH BILATERAL SALPINGO OOPHERECTOMY Bilateral 07/01/2018   Procedure: XI ROBOTIC ASSISTED TOTAL HYSTERECTOMY WITH BILATERAL SALPINGO OOPHORECTOMY;  Surgeon: [name], MD;  [facility];  Service: Gynecology;  Laterality: Bilateral;  . SENTINEL NODE BIOPSY N/A 07/01/2018   Procedure: SENTINEL NODE BIOPSY;  Surgeon: [name], MD;  [facility];  Service: Gynecology;  Laterality: N/A;  . TONSILLECTOMY     Patient had her tonsils taken out at age 26  . TUBAL LIGATION  1974  . UPPER GI ENDOSCOPY  Past Medical Hx:  Past Medical History:  Diagnosis Date  . Anemia   . Broken arm    Right shoulder  . Bunion   . Colon cancer ([place])    surgery followed by chemo for "6 months"  . Cyanocobalamin deficiency   . Depression   . Diabetes mellitus without complication ([place])   . Diabetic neuropathy ([place])   . Dyspnea    with exertion  . Endometrial cancer ([place])   . Hammer toe   . High cholesterol   . History of pneumonia   . Hypertension   . Migraines    history of  . Pulmonary nodules    Numerous small  . Rheumatic fever    age 70    Family Hx:  Family History  Problem Relation Age of Onset  . Heart attack Father        d. 3  . Diabetes Brother   . Colon polyps Brother   . Diabetes Maternal  Grandmother        d. 71  . Lung cancer Mother   . Breast cancer Mother 51       ? bony mets; d. 4  . Breast cancer Maternal Aunt        dx over 42  . Colon cancer Maternal Uncle        dx over 71  . Breast cancer Maternal Aunt        dx over 69  . Cancer Cousin        mat first cousin; unknown type in their 41's  . Cancer Cousin        mat first cousin; Lung in their 46's  . Colon cancer Maternal Uncle        dx over 54  . COPD Maternal Grandfather   . Other Paternal Grandmother        childbirth bed fever, d. 52  . Breast cancer Cousin        pat first cousin d. 78s    Vitals: BP (!) 111/54 (BP Location: Left Arm, Patient Position: Sitting)   Pulse 95   Temp 98 F (36.7 C) (Tympanic)   Resp (!) 24   Ht _0  (1.676 m)   Wt 205 lb (93 kg)   SpO2 98%   BMI 33.09 kg/m   Physical Exam: Physical Exam Chest:  Breasts:     Right: No supraclavicular adenopathy.     Left: No supraclavicular adenopathy.    Abdominal:     General: There is no distension.     Palpations: Abdomen is soft. There is no mass.     Tenderness: There is no abdominal tenderness.  Genitourinary:    General: Normal vulva.     Vagina: No vaginal discharge, bleeding or lesions.     Adnexa:        Right: No tenderness.         Left: No mass or tenderness.    Musculoskeletal:     Right lower leg: No edema.     Left lower leg: No edema.  Lymphadenopathy:     Upper Body:     Right upper body: No supraclavicular adenopathy.     Left upper body: No supraclavicular adenopathy.     Lower Body: No right inguinal adenopathy. No left inguinal adenopathy.  Neurological:     Mental Status: She is alert.     Assessment/Plan:  Endometrial adenocarcinoma ([place]) Negative symptom review, normal exam.  Normal al tumor markers.  No  evidence of recurrence.   >review the repeat tumor markers >return in 6 mths   I personally spent 25 minutes face-to-face and non-face-to-face in the care of this patient,  which includes all pre, intra, and post visit time on the date of service.   Lahoma Crocker, MD  03/06/2021, 11:23 AM

## 2021-03-07 ENCOUNTER — Inpatient Hospital Stay: Payer: Medicare HMO | Attending: Obstetrics & Gynecology | Admitting: Obstetrics & Gynecology

## 2021-03-07 ENCOUNTER — Encounter: Payer: Self-pay | Admitting: Obstetrics & Gynecology

## 2021-03-07 ENCOUNTER — Other Ambulatory Visit: Payer: Self-pay

## 2021-03-07 ENCOUNTER — Inpatient Hospital Stay: Payer: Medicare HMO

## 2021-03-07 VITALS — BP 111/54 | HR 95 | Temp 98.0°F | Resp 24 | Ht 66.0 in | Wt 205.0 lb

## 2021-03-07 DIAGNOSIS — E7800 Pure hypercholesterolemia, unspecified: Secondary | ICD-10-CM | POA: Diagnosis not present

## 2021-03-07 DIAGNOSIS — Z79899 Other long term (current) drug therapy: Secondary | ICD-10-CM | POA: Insufficient documentation

## 2021-03-07 DIAGNOSIS — E1140 Type 2 diabetes mellitus with diabetic neuropathy, unspecified: Secondary | ICD-10-CM | POA: Insufficient documentation

## 2021-03-07 DIAGNOSIS — Z8542 Personal history of malignant neoplasm of other parts of uterus: Secondary | ICD-10-CM | POA: Diagnosis present

## 2021-03-07 DIAGNOSIS — Z7984 Long term (current) use of oral hypoglycemic drugs: Secondary | ICD-10-CM | POA: Diagnosis not present

## 2021-03-07 DIAGNOSIS — Z90710 Acquired absence of both cervix and uterus: Secondary | ICD-10-CM | POA: Insufficient documentation

## 2021-03-07 DIAGNOSIS — Z923 Personal history of irradiation: Secondary | ICD-10-CM | POA: Insufficient documentation

## 2021-03-07 DIAGNOSIS — C541 Malignant neoplasm of endometrium: Secondary | ICD-10-CM

## 2021-03-07 DIAGNOSIS — Z8543 Personal history of malignant neoplasm of ovary: Secondary | ICD-10-CM | POA: Diagnosis present

## 2021-03-07 DIAGNOSIS — Z90722 Acquired absence of ovaries, bilateral: Secondary | ICD-10-CM | POA: Diagnosis not present

## 2021-03-07 DIAGNOSIS — F32A Depression, unspecified: Secondary | ICD-10-CM | POA: Diagnosis not present

## 2021-03-07 DIAGNOSIS — I10 Essential (primary) hypertension: Secondary | ICD-10-CM | POA: Insufficient documentation

## 2021-03-07 DIAGNOSIS — C561 Malignant neoplasm of right ovary: Secondary | ICD-10-CM

## 2021-03-07 NOTE — Patient Instructions (Signed)
Return in 6 mths.

## 2021-03-08 LAB — INHIBIN B: Inhibin B: <7 pg/mL (ref 0.0–16.9)

## 2021-03-08 LAB — CA 125: Cancer Antigen (CA) 125: 10.6 U/mL (ref 0.0–38.1)

## 2021-03-09 ENCOUNTER — Telehealth: Payer: Self-pay | Admitting: *Deleted

## 2021-03-09 NOTE — Telephone Encounter (Signed)
Left message for patient to call GYN office for lab results, [phone].

## 2021-03-13 ENCOUNTER — Telehealth: Payer: Self-pay | Admitting: *Deleted

## 2021-03-13 NOTE — Telephone Encounter (Signed)
Pt called back for labs results. CA-125 level given to pt.

## 2021-06-21 ENCOUNTER — Encounter: Payer: Self-pay | Admitting: Genetic Counselor

## 2021-06-26 DIAGNOSIS — E11621 Type 2 diabetes mellitus with foot ulcer: Secondary | ICD-10-CM | POA: Diagnosis not present

## 2021-06-27 DIAGNOSIS — M86271 Subacute osteomyelitis, right ankle and foot: Secondary | ICD-10-CM | POA: Diagnosis not present

## 2021-06-27 DIAGNOSIS — L02611 Cutaneous abscess of right foot: Secondary | ICD-10-CM | POA: Diagnosis not present

## 2021-06-27 DIAGNOSIS — I4891 Unspecified atrial fibrillation: Secondary | ICD-10-CM

## 2021-06-28 DIAGNOSIS — I342 Nonrheumatic mitral (valve) stenosis: Secondary | ICD-10-CM | POA: Diagnosis not present

## 2021-06-28 DIAGNOSIS — I361 Nonrheumatic tricuspid (valve) insufficiency: Secondary | ICD-10-CM

## 2021-06-28 DIAGNOSIS — I340 Nonrheumatic mitral (valve) insufficiency: Secondary | ICD-10-CM

## 2021-07-06 ENCOUNTER — Other Ambulatory Visit: Payer: Self-pay

## 2021-07-06 ENCOUNTER — Ambulatory Visit: Payer: Medicare HMO | Admitting: Sports Medicine

## 2021-07-06 ENCOUNTER — Encounter: Payer: Self-pay | Admitting: Sports Medicine

## 2021-07-06 DIAGNOSIS — M869 Osteomyelitis, unspecified: Secondary | ICD-10-CM | POA: Insufficient documentation

## 2021-07-06 DIAGNOSIS — Z9889 Other specified postprocedural states: Secondary | ICD-10-CM

## 2021-07-06 DIAGNOSIS — S98131A Complete traumatic amputation of one right lesser toe, initial encounter: Secondary | ICD-10-CM

## 2021-07-06 DIAGNOSIS — M79671 Pain in right foot: Secondary | ICD-10-CM

## 2021-07-06 DIAGNOSIS — M86171 Other acute osteomyelitis, right ankle and foot: Secondary | ICD-10-CM

## 2021-07-06 DIAGNOSIS — J690 Pneumonitis due to inhalation of food and vomit: Secondary | ICD-10-CM | POA: Insufficient documentation

## 2021-07-06 DIAGNOSIS — R197 Diarrhea, unspecified: Secondary | ICD-10-CM | POA: Insufficient documentation

## 2021-07-06 DIAGNOSIS — I4891 Unspecified atrial fibrillation: Secondary | ICD-10-CM | POA: Insufficient documentation

## 2021-07-06 DIAGNOSIS — E669 Obesity, unspecified: Secondary | ICD-10-CM | POA: Insufficient documentation

## 2021-07-06 DIAGNOSIS — E871 Hypo-osmolality and hyponatremia: Secondary | ICD-10-CM | POA: Insufficient documentation

## 2021-07-06 DIAGNOSIS — E1142 Type 2 diabetes mellitus with diabetic polyneuropathy: Secondary | ICD-10-CM

## 2021-07-06 DIAGNOSIS — D649 Anemia, unspecified: Secondary | ICD-10-CM | POA: Insufficient documentation

## 2021-07-06 DIAGNOSIS — L0291 Cutaneous abscess, unspecified: Secondary | ICD-10-CM | POA: Insufficient documentation

## 2021-07-06 DIAGNOSIS — A419 Sepsis, unspecified organism: Secondary | ICD-10-CM | POA: Insufficient documentation

## 2021-07-06 DIAGNOSIS — L97511 Non-pressure chronic ulcer of other part of right foot limited to breakdown of skin: Secondary | ICD-10-CM

## 2021-07-06 DIAGNOSIS — J9601 Acute respiratory failure with hypoxia: Secondary | ICD-10-CM | POA: Insufficient documentation

## 2021-07-06 DIAGNOSIS — I951 Orthostatic hypotension: Secondary | ICD-10-CM | POA: Insufficient documentation

## 2021-07-06 DIAGNOSIS — R0902 Hypoxemia: Secondary | ICD-10-CM | POA: Insufficient documentation

## 2021-07-06 DIAGNOSIS — N390 Urinary tract infection, site not specified: Secondary | ICD-10-CM | POA: Insufficient documentation

## 2021-07-06 DIAGNOSIS — R531 Weakness: Secondary | ICD-10-CM | POA: Insufficient documentation

## 2021-07-06 NOTE — Progress Notes (Signed)
Subjective: Andrea Hoffman is a 75 y.o. female patient seen today in office for POV #1 (DOS 06/27/2021), S/P right foot incision and drainage of abscess and removal of bone for bone culture performed at [facility]. Patient denies pain at surgical site, denies calf pain, denies headache, chest pain, shortness of breath, nausea, vomiting, fever, or chills.  Patient is assisted by husband this visit and has home nursing that is coming to help change the dressings twice weekly as well as continue with antibiotics.  Patient Active Problem List   Diagnosis Date Noted   Abscess 07/06/2021   Acute respiratory failure with hypoxia (HCC) 07/06/2021   Afib ([place]) 07/06/2021   Aspiration pneumonia ([place]) 07/06/2021   Diarrhea 07/06/2021   Generalized weakness 07/06/2021   Hyponatremia 07/06/2021   Hypoxia 07/06/2021   Anemia 07/06/2021   Nocturnal hypoxemia due to obesity 07/06/2021   Orthostatic hypotension 07/06/2021   Osteomyelitis ([place]) 07/06/2021   Sepsis ([place]) 07/06/2021   UTI (urinary tract infection) 07/06/2021   Genetic testing 09/25/2018   History of colon cancer 09/07/2018   Family history of breast cancer    Family history of colon cancer    Family history of colonic polyps    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   Breast mass, left 07/10/2018   Elevated serum creatinine 07/02/2018   Endometrial adenocarcinoma ([place]) 06/10/2018   Benign essential hypertension 05/21/2017   Hollenhorst plaque, 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     metFORMIN (GLUCOPHAGE) 500 MG tablet 1,000 mg.     vitamin B-12 (CYANOCOBALAMIN) 500 MCG tablet 1,000 mcg.     allopurinol (ZYLOPRIM) 100 MG tablet Take 100 mg by mouth daily.     Alpha-Lipoic Acid 200 MG CAPS Take 200-400 mg by mouth See admin instructions. Take 200 mg by mouth in the morning and take 400 mg by mouth at bedtime     amLODipine (NORVASC) 10 MG tablet Take 10 mg by mouth daily.      amoxicillin-clavulanate (AUGMENTIN) 875-125 MG tablet Take 1 tablet by mouth 2 (two) times daily.     carvedilol (COREG) 6.25 MG tablet Take 6.25 mg by mouth 2 (two) times daily with a meal.      citalopram (CELEXA) 40 MG tablet Take 0.5 tablets (20 mg total) by mouth at bedtime. Take 20 mg once daily     ELIQUIS 5 MG TABS tablet Take 5 mg by mouth 2 (two) times daily.     glucose blood test strip      hydrochlorothiazide (HYDRODIURIL) 25 MG tablet Take 25 mg by mouth daily.      lisinopril (PRINIVIL,ZESTRIL) 20 MG tablet Take 20 mg by mouth daily.      magnesium oxide (MAG-OX) 400 (240 Mg) MG tablet Take 1 tablet by mouth daily.     metFORMIN (GLUCOPHAGE) 1000 MG tablet Take 1,000 mg by mouth 2 (two) times daily.     ONETOUCH DELICA LANCETS [id number] MISC      ONETOUCH VERIO test strip USE STRIP TO CHECK  GLUCOSE AS DIRECTED ONCE DAILY IN THE MORNING  99   potassium chloride (KLOR-CON) 10 MEQ tablet Take 10 mEq by mouth daily.     simvastatin (ZOCOR) 20 MG tablet Take 20 mg by mouth at bedtime.     No current facility-administered medications on file prior to visit.    Allergies  Allergen Reactions   Hydrocodone Other (See Comments)    Hallucinations    Objective: There were no vitals filed for this visit.  General: No acute distress, AAOx3  Right foot: Sutures and staples intact over the amputation stump site with no gapping or dehiscence at surgical site, there is a plantar ulceration that measures 0.5 x 0.5 and probes 1 cm to soft tissue in range at area  of previous abscess that is healing by secondary intention there is mild bloody drainage no malodor no pus no significant redness or warmth but there is mild swelling to the amputation stump site.  Capillary fill time intact to all lesser toes of the right foot there is significant digital deformity and dislocation from patient having previous amputations.  There is a preulcerative callus noted to the ball of the right foot.  No pain with calf compression.    Assessment and Plan:  Problem List Items Addressed This Visit       Musculoskeletal and Integument   Osteomyelitis ([employer])     Other   Amputated toe of right foot ([place])   Other Visit Diagnoses     S/P foot surgery, right    -  Primary   Ulcer of right foot, limited to breakdown of skin (HCC)       Right foot pain       Diabetic polyneuropathy associated with type 2 diabetes mellitus ([place])       Relevant Medications   metFORMIN (GLUCOPHAGE) 500 MG tablet        -Patient seen and evaluated -Applied packing to the plantar wound surrounded by Betadine to the surgical incision site and to the plantar forefoot on the right I will cover it with dry sterile dressing to surgical site right foot secured with ACE wrap and stockinet  -Advised patient to make sure to keep dressings clean, dry, and intact to right surgical site allowing nursing to change as directed twice weekly -Advised patient to continue with post-op shoe on right with weightbearing to the heel -Advised patient to limit activity to necessity -Advised patient to ice and elevate as directed and may continue with Tylenol for pain -Continue with oral antibiotics until completed -Will plan for x-rays and possible suture removal at next office visit. In the meantime, patient to call office if any issues or problems arise.   Landis Martins, DPM

## 2021-07-20 ENCOUNTER — Other Ambulatory Visit: Payer: Self-pay

## 2021-07-20 ENCOUNTER — Encounter: Payer: Self-pay | Admitting: Sports Medicine

## 2021-07-20 ENCOUNTER — Ambulatory Visit (INDEPENDENT_AMBULATORY_CARE_PROVIDER_SITE_OTHER): Payer: Medicare HMO

## 2021-07-20 ENCOUNTER — Ambulatory Visit (INDEPENDENT_AMBULATORY_CARE_PROVIDER_SITE_OTHER): Payer: Medicare HMO | Admitting: Sports Medicine

## 2021-07-20 DIAGNOSIS — M86171 Other acute osteomyelitis, right ankle and foot: Secondary | ICD-10-CM

## 2021-07-20 DIAGNOSIS — Z9889 Other specified postprocedural states: Secondary | ICD-10-CM

## 2021-07-20 DIAGNOSIS — L97511 Non-pressure chronic ulcer of other part of right foot limited to breakdown of skin: Secondary | ICD-10-CM | POA: Diagnosis not present

## 2021-07-20 DIAGNOSIS — M79671 Pain in right foot: Secondary | ICD-10-CM

## 2021-07-20 DIAGNOSIS — E1142 Type 2 diabetes mellitus with diabetic polyneuropathy: Secondary | ICD-10-CM

## 2021-07-20 DIAGNOSIS — S98131A Complete traumatic amputation of one right lesser toe, initial encounter: Secondary | ICD-10-CM

## 2021-07-20 NOTE — Progress Notes (Signed)
Subjective: Andrea Hoffman is a 75 y.o. female patient seen today in office for POV # 2 (DOS 06/27/2021), S/P right foot incision and drainage of abscess and removal of bone for bone culture performed at [facility]. Patient denies pain at surgical site, denies calf pain, denies headache, chest pain, shortness of breath, nausea, vomiting, fever, or chills.  Patient is assisted by husband this visit.  No other pedal complaints noted.  Patient Active Problem List   Diagnosis Date Noted   Abscess 07/06/2021   Acute respiratory failure with hypoxia (HCC) 07/06/2021   Afib ([place]) 07/06/2021   Aspiration pneumonia (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   Breast mass, left 07/10/2018   Elevated serum creatinine 07/02/2018   Endometrial adenocarcinoma ([place]) 06/10/2018   Benign essential hypertension 05/21/2017   Hollenhorst plaque, 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     Alpha-Lipoic  Acid 200 MG CAPS Take 200-400 mg by mouth See admin instructions. Take 200 mg by mouth in the morning and take 400 mg by mouth at bedtime     amLODipine (NORVASC) 10 MG tablet Take 10 mg by mouth daily.      amoxicillin-clavulanate (AUGMENTIN) 875-125 MG tablet Take 1 tablet by mouth 2 (two) times daily.     carvedilol (COREG) 6.25 MG tablet Take 6.25 mg by mouth 2 (two) times daily with a meal.      citalopram (CELEXA) 40 MG tablet Take 0.5 tablets (20 mg total) by mouth at bedtime. Take 20 mg once daily     ELIQUIS 5 MG TABS tablet Take 5 mg by mouth 2 (two) times daily.     ferrous sulfate 324 MG TBEC 324 mg.     glucose blood test strip      hydrochlorothiazide (HYDRODIURIL) 25 MG tablet Take 25 mg by mouth daily.      lisinopril (PRINIVIL,ZESTRIL) 20 MG tablet Take 20 mg by mouth daily.      magnesium oxide (MAG-OX) 400 (240 Mg) MG tablet Take 1 tablet by mouth daily.     metFORMIN (GLUCOPHAGE) 1000 MG tablet Take 1,000 mg by mouth 2 (two) times daily.     metFORMIN (GLUCOPHAGE) 500 MG tablet 1,000 mg.     ONETOUCH DELICA LANCETS [id number] MISC      ONETOUCH VERIO test strip USE STRIP TO CHECK GLUCOSE AS DIRECTED ONCE DAILY IN THE MORNING  99   potassium chloride (KLOR-CON) 10 MEQ tablet Take 10 mEq by mouth daily.  simvastatin (ZOCOR) 20 MG tablet Take 20 mg by mouth at bedtime.     vitamin B-12 (CYANOCOBALAMIN) 500 MCG tablet 1,000 mcg.     No current facility-administered medications on file prior to visit.    Allergies  Allergen Reactions   Hydrocodone Other (See Comments)    Hallucinations    Objective: There were no vitals filed for this visit.  General: No acute distress, AAOx3  Right foot: Sutures and staples intact over the amputation stump site with no gapping or dehiscence at surgical site, there is a plantar ulceration that measures 0.5 x 0.5 and probes 0.5 cm to soft tissue in range at area of previous abscess that is healing by secondary intention there is mild bloody  drainage no malodor no pus no significant redness or warmth but there is mild swelling to the amputation stump site.  Capillary fill time intact to all lesser toes of the right foot there is significant digital deformity and dislocation from patient having previous amputations.  There is a preulcerative callus noted to the ball of the right foot.  No pain with calf compression.   X-rays consistent with amputation status and digital deformity   Assessment and Plan:  Problem List Items Addressed This Visit       Musculoskeletal and Integument   Osteomyelitis (HCC)     Other   Amputated toe of right foot ([place])   Other Visit Diagnoses     Ulcer of right foot, limited to breakdown of skin ([place])    -  Primary   Relevant Orders   DG Foot Complete Right   S/P foot surgery, right       Right foot pain       Diabetic polyneuropathy associated with type 2 diabetes mellitus ([place])            -Patient seen and evaluated -X-rays reviewed -Sutures and staples removed -Applied packing to the plantar wound surrounded by Betadine to the surgical incision site and to the plantar forefoot on the right I will cover it with dry sterile dressing to surgical site right foot secured with ACE wrap and stockinet  -Advised patient to make sure to keep dressings clean, dry, and intact to right surgical site allowing nursing to change as directed twice weekly -Advised patient to continue with post-op shoe on right with weightbearing to the heel -Advised patient to limit activity to necessity -Advised patient to ice and elevate as directed and may continue with Tylenol for pain like previous -Will plan for wound care using packing at next office visit. In the meantime, patient to call office if any issues or problems arise.   Landis Martins, DPM

## 2021-08-07 ENCOUNTER — Telehealth: Payer: Self-pay | Admitting: Sports Medicine

## 2021-08-07 NOTE — Telephone Encounter (Signed)
Pt's spouse came by stating pt was in hospital with hip fx.  She will be there for a little and then rehab ans he wanted to know what needed to be done as far as her wound care.

## 2021-08-07 NOTE — Telephone Encounter (Signed)
Spouse notified/reb

## 2021-08-08 ENCOUNTER — Ambulatory Visit: Payer: Medicare HMO | Admitting: Sports Medicine

## 2021-08-31 ENCOUNTER — Ambulatory Visit: Payer: Medicare HMO | Admitting: Sports Medicine

## 2021-08-31 ENCOUNTER — Encounter: Payer: Self-pay | Admitting: Sports Medicine

## 2021-08-31 ENCOUNTER — Other Ambulatory Visit: Payer: Self-pay

## 2021-08-31 DIAGNOSIS — L97511 Non-pressure chronic ulcer of other part of right foot limited to breakdown of skin: Secondary | ICD-10-CM

## 2021-08-31 DIAGNOSIS — M86171 Other acute osteomyelitis, right ankle and foot: Secondary | ICD-10-CM

## 2021-08-31 DIAGNOSIS — Z9889 Other specified postprocedural states: Secondary | ICD-10-CM

## 2021-08-31 DIAGNOSIS — M79671 Pain in right foot: Secondary | ICD-10-CM

## 2021-08-31 DIAGNOSIS — E1142 Type 2 diabetes mellitus with diabetic polyneuropathy: Secondary | ICD-10-CM

## 2021-08-31 DIAGNOSIS — S98131A Complete traumatic amputation of one right lesser toe, initial encounter: Secondary | ICD-10-CM

## 2021-08-31 NOTE — Progress Notes (Signed)
Subjective: Andrea Hoffman is a 75 y.o. female patient seen today in office for POV # 3 (DOS 06/27/2021), S/P right foot incision and drainage of abscess and removal of bone for bone culture performed at [facility]. Patient was recently in hospital for fall and admitted to SNF and then went back into hospital again and was discharged on yesterday,  denies calf pain, denies headache, chest pain, shortness of breath, nausea, vomiting, fever, or chills.  Patient is assisted by husband this visit.  No other pedal complaints noted.  Patient Active Problem List   Diagnosis Date Noted   Abscess 07/06/2021   Acute respiratory failure with hypoxia (HCC) 07/06/2021   Afib ([place]) 07/06/2021   Aspiration pneumonia ([place]) 07/06/2021   Diarrhea 07/06/2021   Generalized weakness 07/06/2021   Hyponatremia 07/06/2021   Hypoxia 07/06/2021   Anemia 07/06/2021   Nocturnal hypoxemia due to obesity 07/06/2021   Orthostatic hypotension 07/06/2021   Osteomyelitis ([place]) 07/06/2021   Sepsis ([place]) 07/06/2021   UTI (urinary tract infection) 07/06/2021   Genetic testing 09/25/2018   History of colon cancer 09/07/2018   Family history of breast cancer    Family history of colon cancer    Family history of colonic polyps    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   Breast mass, left 07/10/2018   Elevated serum creatinine 07/02/2018   Endometrial adenocarcinoma ([place]) 06/10/2018   Benign essential hypertension 05/21/2017   Hollenhorst plaque, 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     Alpha-Lipoic Acid 200 MG CAPS Take 200-400 mg by mouth See admin instructions. Take 200 mg by mouth in the morning and take 400 mg by mouth at bedtime     amLODipine (NORVASC) 10 MG tablet Take 10 mg by mouth daily.      amoxicillin-clavulanate (AUGMENTIN) 875-125 MG tablet Take 1 tablet by mouth 2 (two) times daily.     carvedilol (COREG) 6.25 MG tablet Take 6.25 mg by mouth 2 (two) times daily with a meal.      citalopram (CELEXA) 40 MG tablet Take 0.5 tablets (20 mg total) by mouth at bedtime. Take 20 mg once daily     ELIQUIS 5 MG TABS tablet Take 5 mg by mouth 2 (two) times daily.     ferrous sulfate 324 MG TBEC 324 mg.     glucose blood test strip      hydrochlorothiazide (HYDRODIURIL) 25 MG tablet Take 25 mg by mouth daily.      lisinopril (PRINIVIL,ZESTRIL) 20 MG tablet Take 20 mg by mouth daily.      magnesium oxide (MAG-OX) 400 (240 Mg) MG tablet Take 1 tablet by mouth daily.     metFORMIN (GLUCOPHAGE) 1000 MG tablet Take 1,000 mg by mouth 2 (two) times daily.     metFORMIN (GLUCOPHAGE) 500 MG tablet 1,000 mg.     ONETOUCH DELICA LANCETS 16X MISC      ONETOUCH VERIO test strip USE STRIP TO CHECK GLUCOSE AS DIRECTED ONCE DAILY IN THE  MORNING  99   potassium chloride (KLOR-CON) 10 MEQ tablet Take 10 mEq by mouth daily.     simvastatin (ZOCOR) 20 MG tablet Take 20 mg by mouth at bedtime.     vitamin B-12 (CYANOCOBALAMIN) 500 MCG tablet 1,000 mcg.     No current facility-administered medications on file prior to visit.    Allergies  Allergen Reactions   Hydrocodone Other (See Comments)    Hallucinations    Objective: There were no vitals filed for this visit.  General: No acute distress, AAOx3  Right foot: There is a plantar ulceration that measures 1x 0.3x0.1 cm at amputation stump with mild bloody drainage no malodor no pus no significant redness or warmth but there is mild swelling to the  amputation stump site.  Capillary fill time intact to all lesser toes of the right foot there is significant digital deformity and dislocation from patient having previous amputations.  There is a preulcerative callus noted to the ball of the right foot with is almost resolved.  No pain with calf compression.   Assessment and Plan:  Problem List Items Addressed This Visit       Musculoskeletal and Integument   Osteomyelitis ([place])     Other   Amputated toe of right foot ([place])   Other Visit Diagnoses     Ulcer of right foot, limited to breakdown of skin ([place])    -  Primary   S/P foot surgery, right       Right foot pain       Diabetic polyneuropathy associated with type 2 diabetes mellitus ([place])            -Patient seen and evaluated -Cleansed ulceration -Applied prisma to the plantar wound on the right foot secured with coban wrap and sock -Advised patient to make sure to keep dressings clean, dry, and intact to right surgical site allowing nursing to change as directed twice weekly; Orders faxed to [place] patient to continue with post-op shoe on right with weightbearing to the heel -Advised patient to limit activity to necessity -Will plan for wound care at next office visit. In the meantime, patient to call office if any issues or problems arise.   Landis Martins, DPM

## 2021-09-14 ENCOUNTER — Ambulatory Visit: Payer: Medicare HMO | Admitting: Sports Medicine

## 2021-09-18 ENCOUNTER — Ambulatory Visit (INDEPENDENT_AMBULATORY_CARE_PROVIDER_SITE_OTHER): Payer: Medicare HMO | Admitting: Sports Medicine

## 2021-09-18 ENCOUNTER — Other Ambulatory Visit: Payer: Self-pay

## 2021-09-18 DIAGNOSIS — M79671 Pain in right foot: Secondary | ICD-10-CM

## 2021-09-18 DIAGNOSIS — E1142 Type 2 diabetes mellitus with diabetic polyneuropathy: Secondary | ICD-10-CM

## 2021-09-18 DIAGNOSIS — Z9889 Other specified postprocedural states: Secondary | ICD-10-CM

## 2021-09-18 DIAGNOSIS — S98131A Complete traumatic amputation of one right lesser toe, initial encounter: Secondary | ICD-10-CM

## 2021-09-18 DIAGNOSIS — M86171 Other acute osteomyelitis, right ankle and foot: Secondary | ICD-10-CM

## 2021-09-18 DIAGNOSIS — L97511 Non-pressure chronic ulcer of other part of right foot limited to breakdown of skin: Secondary | ICD-10-CM

## 2021-09-18 NOTE — Progress Notes (Signed)
Subjective: COREENA RUBALCAVA is a 75 y.o. female patient seen today in office for POV # 4 (DOS 06/27/2021), S/P right foot incision and drainage of abscess and removal of bone for bone culture performed at [facility].  Patient is assisted by husband this visit and reports that she is doing good denies any other issues at this time.  No other pedal complaints noted.  Patient Active Problem List   Diagnosis Date Noted   Abscess 07/06/2021   Acute respiratory failure with hypoxia (HCC) 07/06/2021   Afib ([place]) 07/06/2021   Aspiration pneumonia ([place]) 07/06/2021   Diarrhea 07/06/2021   Generalized weakness 07/06/2021   Hyponatremia 07/06/2021   Hypoxia 07/06/2021   Anemia 07/06/2021   Nocturnal hypoxemia due to obesity 07/06/2021   Orthostatic hypotension 07/06/2021   Osteomyelitis ([place]) 07/06/2021   Sepsis ([place]) 07/06/2021   UTI (urinary tract infection) 07/06/2021   Genetic testing 09/25/2018   History of colon cancer 09/07/2018   Family history of breast cancer    Family history of colon cancer    Family history of colonic polyps    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   Breast mass, left 07/10/2018   Elevated serum creatinine 07/02/2018   Endometrial adenocarcinoma ([place]) 06/10/2018   Benign essential hypertension 05/21/2017   Hollenhorst plaque, 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     Alpha-Lipoic Acid 200 MG CAPS Take 200-400 mg by mouth See admin instructions. Take  200 mg by mouth in the morning and take 400 mg by mouth at bedtime     amLODipine (NORVASC) 10 MG tablet Take 10 mg by mouth daily.      amoxicillin-clavulanate (AUGMENTIN) 875-125 MG tablet Take 1 tablet by mouth 2 (two) times daily.     carvedilol (COREG) 6.25 MG tablet Take 6.25 mg by mouth 2 (two) times daily with a meal.      celecoxib (CELEBREX) 200 MG capsule Take 200 mg by mouth 2 (two) times daily.     citalopram (CELEXA) 40 MG tablet Take 0.5 tablets (20 mg total) by mouth at bedtime. Take 20 mg once daily     ELIQUIS 5 MG TABS tablet Take 5 mg by mouth 2 (two) times daily.     ferrous sulfate 324 MG TBEC 324 mg.     glucose blood test strip      hydrochlorothiazide (HYDRODIURIL) 25 MG tablet Take 25 mg by mouth daily.      lisinopril (PRINIVIL,ZESTRIL) 20 MG tablet Take 20 mg by mouth daily.      magnesium oxide (MAG-OX) 400 (240 Mg) MG tablet Take 1 tablet by mouth daily.     metFORMIN (GLUCOPHAGE) 1000 MG tablet Take 1,000 mg by mouth 2 (two) times daily.     metFORMIN (GLUCOPHAGE) 500 MG tablet 1,000 mg.     ONETOUCH DELICA LANCETS 00Q MISC      ONETOUCH VERIO test strip USE STRIP TO CHECK GLUCOSE AS DIRECTED ONCE DAILY IN THE MORNING  99   oxyCODONE (OXY  IR/ROXICODONE) 5 MG immediate release tablet Take 5 mg by mouth every 4 (four) hours as needed.     potassium chloride (KLOR-CON) 10 MEQ tablet Take 10 mEq by mouth daily.     simvastatin (ZOCOR) 20 MG tablet Take 20 mg by mouth at bedtime.     vitamin B-12 (CYANOCOBALAMIN) 500 MCG tablet 1,000 mcg.     No current facility-administered medications on file prior to visit.    Allergies  Allergen Reactions   Hydrocodone Other (See Comments)    Hallucinations    Objective: There were no vitals filed for this visit.  General: No acute distress, AAOx3  Right foot: There is a plantar ulceration that measures 0.5 x 0.1x0.1 cm at amputation stump with mild bloody drainage no malodor no pus no significant redness or warmth but  there is mild swelling to the amputation stump site.  Mild maceration in between the toes, capillary fill time intact to all lesser toes of the right foot there is significant digital deformity and dislocation from patient having previous amputations.  There is a preulcerative callus noted to the ball of the right foot with is almost resolved.  No pain with calf compression.   Assessment and Plan:  Problem List Items Addressed This Visit       Musculoskeletal and Integument   Osteomyelitis ([place])     Other   Amputated toe of right foot ([place])   Other Visit Diagnoses     Ulcer of right foot, limited to breakdown of skin ([place])    -  Primary   S/P foot surgery, right       Right foot pain       Diabetic polyneuropathy associated with type 2 diabetes mellitus ([place])            -Patient seen and evaluated -Cleansed ulceration -Applied betadine in between toes on the right foot and periwound and applied Medihoney to the plantar sore.  The wound is healing good therefore Prisma has been discontinued.  Dry dressing was applied to the right foot secured with Coban and sock  -Advised patient to make sure to keep dressings clean, dry, and intact to right surgical site allowing nursing to change as directed twice weekly; Orders faxed to [place] patient to continue with post-op shoe on right with weightbearing to the heel until next visit -Advised patient to limit activity to necessity -Will plan for wound care at next office visit. In the meantime, patient to call office if any issues or problems arise.   Landis Martins, DPM

## 2021-10-09 ENCOUNTER — Telehealth: Payer: Self-pay

## 2021-10-09 ENCOUNTER — Other Ambulatory Visit: Payer: Self-pay

## 2021-10-09 ENCOUNTER — Ambulatory Visit (INDEPENDENT_AMBULATORY_CARE_PROVIDER_SITE_OTHER): Payer: Medicare HMO | Admitting: Sports Medicine

## 2021-10-09 DIAGNOSIS — M79671 Pain in right foot: Secondary | ICD-10-CM | POA: Diagnosis not present

## 2021-10-09 DIAGNOSIS — S98131A Complete traumatic amputation of one right lesser toe, initial encounter: Secondary | ICD-10-CM | POA: Diagnosis not present

## 2021-10-09 DIAGNOSIS — Z9889 Other specified postprocedural states: Secondary | ICD-10-CM | POA: Diagnosis not present

## 2021-10-09 DIAGNOSIS — L97511 Non-pressure chronic ulcer of other part of right foot limited to breakdown of skin: Secondary | ICD-10-CM

## 2021-10-09 DIAGNOSIS — E1142 Type 2 diabetes mellitus with diabetic polyneuropathy: Secondary | ICD-10-CM

## 2021-10-09 NOTE — Progress Notes (Signed)
Subjective: Andrea Hoffman is a 75 y.o. female patient seen today in office for POV #  5 (DOS 06/27/2021), S/P right foot incision and drainage of abscess and removal of bone for bone culture performed at [facility].  Patient is assisted by husband this visit and reports that she is doing good and that the nurses say that the wound is looking good.  Denies any other issues at this time.  No other pedal complaints noted.  Patient Active Problem List   Diagnosis Date Noted   Abscess 07/06/2021   Acute respiratory failure with hypoxia (HCC) 07/06/2021   Afib ([place]) 07/06/2021   Aspiration pneumonia ([place]) 07/06/2021   Diarrhea 07/06/2021   Generalized weakness 07/06/2021   Hyponatremia 07/06/2021   Hypoxia 07/06/2021   Anemia 07/06/2021   Nocturnal hypoxemia due to obesity 07/06/2021   Orthostatic hypotension 07/06/2021   Osteomyelitis ([place]) 07/06/2021   Sepsis ([place]) 07/06/2021   UTI (urinary tract infection) 07/06/2021   Genetic testing 09/25/2018   History of colon cancer 09/07/2018   Family history of breast cancer    Family history of colon cancer    Family history of colonic polyps    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     Alpha-Lipoic Acid 200 MG  CAPS Take 200-400 mg by mouth See admin instructions. Take 200 mg by mouth in the morning and take 400 mg by mouth at bedtime     amLODipine (NORVASC) 10 MG tablet Take 10 mg by mouth daily.      amoxicillin-clavulanate (AUGMENTIN) 875-125 MG tablet Take 1 tablet by mouth 2 (two) times daily.     carvedilol (COREG) 6.25 MG tablet Take 6.25 mg by mouth 2 (two) times daily with a meal.      celecoxib (CELEBREX) 200 MG capsule Take 200 mg by mouth 2 (two) times daily.     citalopram (CELEXA) 40 MG tablet Take 0.5 tablets (20 mg total) by mouth at bedtime. Take 20 mg once daily     ELIQUIS 5 MG TABS tablet Take 5 mg by mouth 2 (two) times daily.     ferrous sulfate 324 MG TBEC 324 mg.     glucose blood test strip      hydrochlorothiazide (HYDRODIURIL) 25 MG tablet Take 25 mg by mouth daily.      lisinopril (PRINIVIL,ZESTRIL) 20 MG tablet Take 20 mg by mouth daily.      magnesium oxide (MAG-OX) 400 (240 Mg) MG tablet Take 1 tablet by mouth daily.     metFORMIN (GLUCOPHAGE) 1000 MG tablet Take 1,000 mg by mouth 2 (two) times daily.     metFORMIN (GLUCOPHAGE) 500 MG tablet 1,000 mg.     ONETOUCH DELICA LANCETS 36R MISC      ONETOUCH VERIO test strip USE STRIP TO CHECK GLUCOSE  AS DIRECTED ONCE DAILY IN THE MORNING  99   oxyCODONE (OXY IR/ROXICODONE) 5 MG immediate release tablet Take 5 mg by mouth every 4 (four) hours as needed.     potassium chloride (KLOR-CON) 10 MEQ tablet Take 10 mEq by mouth daily.     simvastatin (ZOCOR) 20 MG tablet Take 20 mg by mouth at bedtime.     vitamin B-12 (CYANOCOBALAMIN) 500 MCG tablet 1,000 mcg.     No current facility-administered medications on file prior to visit.    Allergies  Allergen Reactions   Hydrocodone Other (See Comments)    Hallucinations    Objective: There were no vitals filed for this visit.  General: No acute distress, AAOx3  Right foot: There is a dry scab at the plantar amputation stump upon debridement there was no underlying opening  previous wound is now healed.  No significant maceration in between the toes, capillary fill time intact to all lesser toes of the right foot there is significant digital deformity and dislocation from patient having previous amputations.  There is a preulcerative callus noted to the ball of the right foot with no opening or signs of infection appears to be now resolved.  No pain with calf compression.   Assessment and Plan:  Problem List Items Addressed This Visit       Other   Amputated toe of right foot ([place])   Other Visit Diagnoses     Ulcer of right foot, limited to breakdown of skin ([place])    -  Primary   S/P foot surgery, right       Right foot pain       Diabetic polyneuropathy associated with type 2 diabetes mellitus ([place])            -Patient seen and evaluated -Using a 15 blade mechanically debrided area of previous ulceration at the right plantar hallux amputation stump upon debridement there was no underlying opening all areas are healed -May discontinue home nursing -Advised patient to wear a clean white sock and closely monitor -May use a normal shoe and today I applied offloading padding to her shoe on the right -Advised patient to continue walker for stability in gait -Return to office in 1 month for wound check to ensure that the right foot wound has remained healed or sooner if any problems or issues arise.  Landis Martins, DPM

## 2021-10-09 NOTE — Telephone Encounter (Signed)
[place] to relay Dr. Leeanne Rio message to discharge nursing for pt since pt's wound is healed.

## 2021-10-09 NOTE — Telephone Encounter (Signed)
-----   Message from Landis Martins, [place] sent at 10/09/2021 12:06 PM EST ----- Regarding: Andrea Hoffman will you call [facility] to let them know that patient is healed.  They can come out to see her later this week or next week to do their discharge paperwork.  Patient no longer needs wound care.

## 2021-10-31 ENCOUNTER — Ambulatory Visit: Payer: Medicare HMO | Admitting: Sports Medicine

## 2021-11-02 ENCOUNTER — Ambulatory Visit: Payer: Medicare HMO | Admitting: Sports Medicine

## 2021-11-02 DIAGNOSIS — M79671 Pain in right foot: Secondary | ICD-10-CM

## 2021-11-02 DIAGNOSIS — L97511 Non-pressure chronic ulcer of other part of right foot limited to breakdown of skin: Secondary | ICD-10-CM | POA: Diagnosis not present

## 2021-11-02 DIAGNOSIS — E1142 Type 2 diabetes mellitus with diabetic polyneuropathy: Secondary | ICD-10-CM

## 2021-11-02 DIAGNOSIS — S98131A Complete traumatic amputation of one right lesser toe, initial encounter: Secondary | ICD-10-CM

## 2021-11-02 DIAGNOSIS — Z9889 Other specified postprocedural states: Secondary | ICD-10-CM

## 2021-11-02 NOTE — Progress Notes (Signed)
Subjective: Andrea Hoffman is a 75 y.o. female patient seen today in office for POV #  5 (DOS 06/27/2021), S/P right foot incision and drainage of abscess and removal of bone for bone culture performed at [facility].  Patient is assisted by husband this visit and reports home nurse noticed bruising to the ball of the foot and told her to start wearing her surgical shoe Patient on her own started dressing the area using Medihoney.  No other pedal complaints noted.  Patient Active Problem List   Diagnosis Date Noted   Abscess 07/06/2021   Acute respiratory failure with hypoxia (HCC) 07/06/2021   Afib ([place]) 07/06/2021   Aspiration pneumonia ([place]) 07/06/2021   Diarrhea 07/06/2021   Generalized weakness 07/06/2021   Hyponatremia 07/06/2021   Hypoxia 07/06/2021   Anemia 07/06/2021   Nocturnal hypoxemia due to obesity 07/06/2021   Orthostatic hypotension 07/06/2021   Osteomyelitis ([place]) 07/06/2021   Sepsis ([place]) 07/06/2021   UTI (urinary tract infection) 07/06/2021   Genetic testing 09/25/2018   History of colon cancer 09/07/2018   Family history of breast cancer    Family history of colon cancer    Family history of colonic polyps    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   Breast mass, left 07/10/2018   Elevated serum creatinine 07/02/2018   Endometrial adenocarcinoma ([place]) 06/10/2018   Benign essential hypertension 05/21/2017   Hollenhorst plaque, 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     Alpha-Lipoic Acid 200 MG CAPS Take 200-400 mg by mouth See admin instructions. Take 200 mg by mouth in the morning and take 400 mg by mouth at bedtime     amLODipine (NORVASC) 10 MG tablet Take 10 mg by mouth daily.      amoxicillin-clavulanate (AUGMENTIN) 875-125 MG tablet Take 1 tablet by mouth 2 (two) times daily.     carvedilol (COREG) 6.25 MG tablet Take 6.25 mg by mouth 2 (two) times daily with a meal.      celecoxib (CELEBREX) 200 MG capsule Take 200 mg by mouth 2 (two) times daily.     citalopram (CELEXA) 40 MG tablet Take 0.5 tablets (20 mg total) by mouth at bedtime. Take 20 mg once daily     ELIQUIS 5 MG TABS tablet Take 5 mg by mouth 2 (two) times daily.     ferrous sulfate 324 MG TBEC 324 mg.     glucose blood test strip      hydrochlorothiazide (HYDRODIURIL) 25 MG tablet Take 25 mg by mouth daily.      lisinopril (PRINIVIL,ZESTRIL) 20 MG tablet Take 20 mg by mouth daily.      magnesium oxide (MAG-OX) 400 (240 Mg) MG tablet Take 1 tablet by mouth daily.     metFORMIN (GLUCOPHAGE) 1000 MG tablet Take 1,000 mg by mouth 2 (two) times daily.     metFORMIN (GLUCOPHAGE) 500 MG tablet 1,000 mg.     ONETOUCH DELICA LANCETS 16X MISC      ONETOUCH VERIO test strip  USE STRIP TO CHECK GLUCOSE AS DIRECTED ONCE DAILY IN THE MORNING  99   oxyCODONE (OXY IR/ROXICODONE) 5 MG immediate release tablet Take 5 mg by mouth every 4 (four) hours as needed.     potassium chloride (KLOR-CON) 10 MEQ tablet Take 10 mEq by mouth daily.     simvastatin (ZOCOR) 20 MG tablet Take 20 mg by mouth at bedtime.     vitamin B-12 (CYANOCOBALAMIN) 500 MCG tablet 1,000 mcg.     No current facility-administered medications on file prior to visit.    Allergies  Allergen Reactions   Hydrocodone Other (See Comments)    Hallucinations    Objective: There were no vitals filed for this visit.  General: No acute distress, AAOx3  Right foot: Ball of right foot there is blistered skin upon debridement  there is a partial-thickness ulcer that measures 1 x 0.6 cm with a granular base, no surrounding warmth redness minimal swelling there is significant digital deformity from previous amputation history.  Subjective knee pain on the right from use of surgical shoe.  Assessment and Plan:  Problem List Items Addressed This Visit       Other   Amputated toe of right foot ([place])   Other Visit Diagnoses     Ulcer of right foot, limited to breakdown of skin ([place])    -  Primary   S/P foot surgery, right       Right foot pain       Diabetic polyneuropathy associated with type 2 diabetes mellitus ([place])            -Patient seen and evaluated -Using a 15 blade mechanically debrided blistered area which revealed a wound in the plantar aspect of the right foot applied Medihoney and dry dressing reordered [facility] to resume wound care twice weekly -Advised patient to continue with heel offloading shoe -Advised patient to try to wear a knee brace on her right knee -Advised patient to continue walker for stability in gait -Return to office in 3 weeks for continued wound care and for casting of custom insole to offload ball of foot or sooner if any problems or issues arise.  Landis Martins, DPM

## 2021-11-06 ENCOUNTER — Ambulatory Visit: Payer: Medicare HMO | Admitting: Sports Medicine

## 2021-11-07 ENCOUNTER — Telehealth: Payer: Self-pay | Admitting: *Deleted

## 2021-11-07 NOTE — Telephone Encounter (Signed)
Morey Hummingbird from [facility] called and stated that she needed a verbal from Dr Cannon Kettle stating that the patient needs to be seen every two weeks for 7 weeks for the right plantar foot and per Dr Cannon Kettle gave a verbal and I relayed the message to [facility] from [facility]. Lattie Haw

## 2021-11-13 ENCOUNTER — Ambulatory Visit: Payer: Medicare HMO | Admitting: Sports Medicine

## 2021-11-23 ENCOUNTER — Ambulatory Visit: Payer: Medicare HMO | Admitting: Sports Medicine

## 2021-12-04 ENCOUNTER — Encounter: Payer: Self-pay | Admitting: Sports Medicine

## 2021-12-04 ENCOUNTER — Ambulatory Visit: Payer: Medicare HMO | Admitting: Sports Medicine

## 2021-12-04 ENCOUNTER — Other Ambulatory Visit: Payer: Self-pay

## 2021-12-04 DIAGNOSIS — S98131A Complete traumatic amputation of one right lesser toe, initial encounter: Secondary | ICD-10-CM

## 2021-12-04 DIAGNOSIS — Z9889 Other specified postprocedural states: Secondary | ICD-10-CM

## 2021-12-04 DIAGNOSIS — E1142 Type 2 diabetes mellitus with diabetic polyneuropathy: Secondary | ICD-10-CM

## 2021-12-04 DIAGNOSIS — M216X1 Other acquired deformities of right foot: Secondary | ICD-10-CM

## 2021-12-04 DIAGNOSIS — M79671 Pain in right foot: Secondary | ICD-10-CM

## 2021-12-04 DIAGNOSIS — L97511 Non-pressure chronic ulcer of other part of right foot limited to breakdown of skin: Secondary | ICD-10-CM | POA: Diagnosis not present

## 2021-12-04 NOTE — Progress Notes (Addendum)
Subjective: Andrea Hoffman is a 76 y.o. female patient seen today in office for POV #  6 (DOS 06/27/2021), S/P right foot incision and drainage of abscess and removal of bone for bone culture performed at [facility].  Patient is assisted by husband this visit.  No other pedal complaints noted at this time  Patient Active Problem List   Diagnosis Date Noted   Abscess 07/06/2021   Acute respiratory failure with hypoxia ([place]) 07/06/2021   Afib ([place]) 07/06/2021   Aspiration pneumonia ([place]) 07/06/2021   Diarrhea 07/06/2021   Generalized weakness 07/06/2021   Hyponatremia 07/06/2021   Hypoxia 07/06/2021   Anemia 07/06/2021   Nocturnal hypoxemia due to obesity 07/06/2021   Orthostatic hypotension 07/06/2021   Osteomyelitis ([place]) 07/06/2021   Sepsis ([place]) 07/06/2021   UTI (urinary tract infection) 07/06/2021   Genetic testing 09/25/2018   History of colon cancer 09/07/2018   Family history of breast cancer    Family history of colon cancer    Family history of colonic polyps    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   Breast mass, left 07/10/2018   Elevated serum creatinine 07/02/2018   Endometrial adenocarcinoma ([place]) 06/10/2018   Benign essential hypertension 05/21/2017   Hollenhorst plaque, 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     Alpha-Lipoic Acid 200 MG CAPS Take 200-400 mg by mouth See admin instructions. Take 200 mg by mouth in the morning and take 400 mg by mouth at  bedtime     amLODipine (NORVASC) 10 MG tablet Take 10 mg by mouth daily.      amoxicillin-clavulanate (AUGMENTIN) 875-125 MG tablet Take 1 tablet by mouth 2 (two) times daily.     carvedilol (COREG) 6.25 MG tablet Take 6.25 mg by mouth 2 (two) times daily with a meal.      celecoxib (CELEBREX) 200 MG capsule Take 200 mg by mouth 2 (two) times daily.     citalopram (CELEXA) 40 MG tablet Take 0.5 tablets (20 mg total) by mouth at bedtime. Take 20 mg once daily     ELIQUIS 5 MG TABS tablet Take 5 mg by mouth 2 (two) times daily.     ferrous sulfate 324 MG TBEC 324 mg.     furosemide (LASIX) 20 MG tablet Take 20 mg by mouth daily.     glucose blood test strip      hydrochlorothiazide (HYDRODIURIL) 25 MG tablet Take 25 mg by mouth daily.      lisinopril (PRINIVIL,ZESTRIL) 20 MG tablet Take 20 mg by mouth daily.      magnesium oxide (MAG-OX) 400 (240 Mg) MG tablet Take 1 tablet by mouth daily.     metFORMIN (GLUCOPHAGE) 1000 MG tablet Take 1,000 mg by mouth 2 (two) times daily.     metFORMIN (GLUCOPHAGE) 500 MG tablet 1,000 mg.     ONETOUCH DELICA LANCETS 59D MISC      ONETOUCH VERIO test strip USE STRIP TO CHECK GLUCOSE AS DIRECTED ONCE DAILY IN THE MORNING  99   oxyCODONE (OXY IR/ROXICODONE) 5 MG immediate release tablet Take 5 mg by mouth every 4 (four) hours as needed.     potassium chloride (KLOR-CON) 10 MEQ tablet Take 10 mEq by mouth daily.     simvastatin (ZOCOR) 20 MG tablet Take 20 mg by mouth at bedtime.     vitamin B-12 (CYANOCOBALAMIN) 500 MCG tablet 1,000 mcg.     No current facility-administered medications on file prior to visit.    Allergies  Allergen Reactions   Hydrocodone Other (See Comments)    Hallucinations    Objective: There were no vitals filed for this visit.  General: No acute distress, AAOx3  Right foot: Ball of right foot there is blistered skin upon debridement there is a partial-thickness ulcer that measures 1 x 0.6 cm with a granular base, no surrounding  warmth redness minimal swelling there is significant digital deformity from previous amputation history.  Subjective knee pain on the right from use of surgical shoe.  Assessment and Plan:  Problem List Items Addressed This Visit       Other   Amputated toe of right foot ([place])   Other Visit Diagnoses     Ulcer of right foot, limited to breakdown of skin ([place])    -  Primary   S/P foot surgery, right       Right foot pain       Diabetic polyneuropathy associated with type 2 diabetes mellitus (HCC)       Acquired equinus deformity of right foot            -Patient seen and evaluated -Using a 15 blade mechanically debrided blistered area which revealed a wound in the plantar aspect of the right foot applied Medihoney and dry dressing reordered [facility] to resume wound care twice weekly -Advised patient to continue with heel offloading shoe meanwhile however patient was casted by foam box for custom diabetic insoles to offload the ball of the right foot -Advised patient to try to wear a knee brace on her right knee -Advised patient to continue walker for stability in gait -Return to office in 3 weeks for continued wound care or sooner if any problems or issues arise.  Landis Martins, DPM

## 2021-12-27 ENCOUNTER — Other Ambulatory Visit: Payer: Self-pay | Admitting: Sports Medicine

## 2021-12-27 NOTE — Progress Notes (Signed)
Error

## 2022-01-04 ENCOUNTER — Encounter: Payer: Self-pay | Admitting: Sports Medicine

## 2022-01-04 ENCOUNTER — Ambulatory Visit: Payer: Medicare HMO | Admitting: Sports Medicine

## 2022-01-04 DIAGNOSIS — S98131A Complete traumatic amputation of one right lesser toe, initial encounter: Secondary | ICD-10-CM

## 2022-01-04 DIAGNOSIS — Z9889 Other specified postprocedural states: Secondary | ICD-10-CM

## 2022-01-04 DIAGNOSIS — M79671 Pain in right foot: Secondary | ICD-10-CM

## 2022-01-04 DIAGNOSIS — L97511 Non-pressure chronic ulcer of other part of right foot limited to breakdown of skin: Secondary | ICD-10-CM

## 2022-01-04 NOTE — Progress Notes (Signed)
Subjective: Andrea Hoffman is a 76 y.o. female patient seen today in office for POV #  7 (DOS 06/27/2021), S/P right foot incision and drainage of abscess and removal of bone for bone culture performed at [facility].  Patient report she is tired of having a wound and having to wear the surgical shoe.  Patient is assisted by husband this visit.  No other pedal complaints noted at this time  Patient Active Problem List   Diagnosis Date Noted   Abscess 07/06/2021   Acute respiratory failure with hypoxia ([place]) 07/06/2021   Afib ([place]) 07/06/2021   Aspiration pneumonia ([place]) 07/06/2021   Diarrhea 07/06/2021   Generalized weakness 07/06/2021   Hyponatremia 07/06/2021   Hypoxia 07/06/2021   Anemia 07/06/2021   Nocturnal hypoxemia due to obesity 07/06/2021   Orthostatic hypotension 07/06/2021   Osteomyelitis ([place]) 07/06/2021   Sepsis ([place]) 07/06/2021   UTI (urinary tract infection) 07/06/2021   Genetic testing 09/25/2018   History of colon cancer 09/07/2018   Family history of breast cancer    Family history of colon cancer    Family history of colonic polyps    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   Breast mass, left 07/10/2018   Elevated serum creatinine 07/02/2018   Endometrial adenocarcinoma ([place]) 06/10/2018   Benign essential hypertension 05/21/2017   Hollenhorst plaque, 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     Alpha-Lipoic Acid 200 MG CAPS Take 200-400 mg by mouth See  admin instructions. Take 200 mg by mouth in the morning and take 400 mg by mouth at bedtime     amLODipine (NORVASC) 10 MG tablet Take 10 mg by mouth daily.      amoxicillin-clavulanate (AUGMENTIN) 875-125 MG tablet Take 1 tablet by mouth 2 (two) times daily.     carvedilol (COREG) 6.25 MG tablet Take 6.25 mg by mouth 2 (two) times daily with a meal.      celecoxib (CELEBREX) 200 MG capsule Take 200 mg by mouth 2 (two) times daily.     citalopram (CELEXA) 40 MG tablet Take 0.5 tablets (20 mg total) by mouth at bedtime. Take 20 mg once daily     ELIQUIS 5 MG TABS tablet Take 5 mg by mouth 2 (two) times daily.     ferrous sulfate 324 MG TBEC 324 mg.     furosemide (LASIX) 20 MG tablet Take 20 mg by mouth daily.     glucose blood test strip      hydrochlorothiazide (HYDRODIURIL) 25 MG tablet Take 25 mg by mouth daily.      lisinopril (PRINIVIL,ZESTRIL) 20 MG tablet Take 20 mg by mouth daily.      magnesium oxide (MAG-OX) 400 (240 Mg) MG tablet Take 1 tablet by mouth daily.     metFORMIN (GLUCOPHAGE) 1000 MG tablet Take 1,000 mg by mouth 2 (two) times daily.     metFORMIN (GLUCOPHAGE) 500 MG tablet 1,000 mg.     ONETOUCH DELICA LANCETS 56O MISC  ONETOUCH VERIO test strip USE STRIP TO CHECK GLUCOSE AS DIRECTED ONCE DAILY IN THE MORNING  99   oxyCODONE (OXY IR/ROXICODONE) 5 MG immediate release tablet Take 5 mg by mouth every 4 (four) hours as needed.     potassium chloride (KLOR-CON) 10 MEQ tablet Take 10 mEq by mouth daily.     simvastatin (ZOCOR) 20 MG tablet Take 20 mg by mouth at bedtime.     vitamin B-12 (CYANOCOBALAMIN) 500 MCG tablet 1,000 mcg.     No current facility-administered medications on file prior to visit.    Allergies  Allergen Reactions   Hydrocodone Other (See Comments)    Hallucinations    Objective: There were no vitals filed for this visit.  General: No acute distress, AAOx3  Right foot: Ball of right foot there is blistered skin upon debridement there is a  pinpoint wound with a granular base, no surrounding warmth redness minimal swelling there is significant digital deformity from previous amputation history.   Assessment and Plan:  Problem List Items Addressed This Visit       Other   Amputated toe of right foot ([place])   Other Visit Diagnoses     Ulcer of right foot, limited to breakdown of skin ([place])    -  Primary   S/P foot surgery, right       Right foot pain            -Patient seen and evaluated -Using a 15 blade mechanically debrided blistered area which revealed a wound in the plantar aspect of the right foot applied Medihoney and offloading padding with dry dressing reordered [facility] to resume wound care twice weekly -Advised patient to continue with heel offloading shoe meanwhile awaiting custom diabetic insole to further offload the ball of the foot -Continue with walker for stability -Return to office in 3-4 weeks for continued wound care and hopefully dispensing of insole or sooner if any problems or issues arise.  Landis Martins, DPM

## 2022-01-22 ENCOUNTER — Telehealth: Payer: Self-pay | Admitting: *Deleted

## 2022-01-22 NOTE — Telephone Encounter (Signed)
Andrea Hoffman from [facility] and stated that the wound was completely healed and that was the beginning of last week and does have a thick callus and does have dry skin and the nurse did change up the padding and used a thicker pad and will decrease home visits to 1 time a week and per Dr Cannon Kettle gave the verbal to do the above orders and I called and relayed the message to [facility] and stated to call the office if any concerns or questions. Lattie Haw

## 2022-01-30 ENCOUNTER — Other Ambulatory Visit: Payer: Self-pay

## 2022-01-30 ENCOUNTER — Ambulatory Visit: Payer: Medicare HMO | Admitting: Sports Medicine

## 2022-01-30 ENCOUNTER — Encounter: Payer: Self-pay | Admitting: Sports Medicine

## 2022-01-30 DIAGNOSIS — E1142 Type 2 diabetes mellitus with diabetic polyneuropathy: Secondary | ICD-10-CM | POA: Diagnosis not present

## 2022-01-30 DIAGNOSIS — L97511 Non-pressure chronic ulcer of other part of right foot limited to breakdown of skin: Secondary | ICD-10-CM | POA: Diagnosis not present

## 2022-01-30 DIAGNOSIS — M79671 Pain in right foot: Secondary | ICD-10-CM | POA: Diagnosis not present

## 2022-01-30 DIAGNOSIS — S98131A Complete traumatic amputation of one right lesser toe, initial encounter: Secondary | ICD-10-CM

## 2022-01-30 DIAGNOSIS — Z9889 Other specified postprocedural states: Secondary | ICD-10-CM

## 2022-01-30 NOTE — Progress Notes (Signed)
Subjective: ?KIMBERLA Hoffman is a 76 y.o. female patient seen today in office for POV #8 (DOS 06/27/2021), S/P right foot incision and drainage of abscess and removal of bone for bone culture performed at [facility].  Patient reports she is doing okay and is assisted by husband who reports that home nurses coming out once weekly and states that the area is looking good. ? ?Patient Active Problem List  ? Diagnosis Date Noted  ? Abscess 07/06/2021  ? Acute respiratory failure with hypoxia ([place]) 07/06/2021  ? Afib ([place]) 07/06/2021  ? Aspiration pneumonia ([place]) 07/06/2021  ? Diarrhea 07/06/2021  ? Generalized weakness 07/06/2021  ? Hyponatremia 07/06/2021  ? Hypoxia 07/06/2021  ? Anemia 07/06/2021  ? Nocturnal hypoxemia due to obesity 07/06/2021  ? Orthostatic hypotension 07/06/2021  ? Osteomyelitis ([place]) 07/06/2021  ? Sepsis ([place]) 07/06/2021  ? UTI (urinary tract infection) 07/06/2021  ? Genetic testing 09/25/2018  ? History of colon cancer 09/07/2018  ? Family history of breast cancer   ? Family history of colon cancer   ? Family history of colonic polyps   ? 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 Breast mass, left 07/10/2018  ? Elevated serum creatinine 07/02/2018  ? Endometrial adenocarcinoma ([place]) 06/10/2018  ? Benign essential hypertension 05/21/2017  ? 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 Mixed hyperlipidemia 05/21/2017  ? Precordial chest pain 05/21/2017  ? Type 2 diabetes mellitus with peripheral neuropathy ([place]) 05/21/2017  ? Amputated toe of right foot ([place]) 02/26/2016  ? 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 Diabetic polyneuropathy associated with diabetes mellitus due to underlying condition ([place]) 01/29/2016  ? ? ?Current Outpatient Medications on File Prior to Visit  ?Medication Sig Dispense Refill  ? allopurinol (ZYLOPRIM) 100 MG tablet Take 100 mg by mouth daily.    ? Alpha-Lipoic Acid 200 MG CAPS Take 200-400 mg by mouth See admin instructions.  Take 200 mg by mouth in the morning and take 400 mg by mouth at bedtime    ? amLODipine (NORVASC) 10 MG tablet Take 10 mg by mouth daily.     ? amoxicillin-clavulanate (AUGMENTIN) 875-125 MG tablet Take 1 tablet by mouth 2 (two) times daily.    ? carvedilol (COREG) 6.25 MG tablet Take 6.25 mg by mouth 2 (two) times daily with a meal.     ? celecoxib (CELEBREX) 200 MG capsule Take 200 mg by mouth 2 (two) times daily.    ? citalopram (CELEXA) 40 MG tablet Take 0.5 tablets (20 mg total) by mouth at bedtime. Take 20 mg once daily    ? ELIQUIS 5 MG TABS tablet Take 5 mg by mouth 2 (two) times daily.    ? ferrous sulfate 324 MG TBEC 324 mg.    ? furosemide (LASIX) 20 MG tablet Take 20 mg by mouth daily.    ? glucose blood test strip     ? hydrochlorothiazide (HYDRODIURIL) 25 MG tablet Take 25 mg by mouth daily.     ? lisinopril (PRINIVIL,ZESTRIL) 20 MG tablet Take 20 mg by mouth daily.     ? magnesium oxide (MAG-OX) 400 (240 Mg) MG tablet Take 1 tablet by mouth daily.    ? metFORMIN (GLUCOPHAGE) 1000 MG tablet Take 1,000 mg by mouth 2 (two) times daily.    ? metFORMIN (GLUCOPHAGE) 500 MG tablet 1,000 mg.    ? ONETOUCH DELICA LANCETS 11H MISC     ? ONETOUCH VERIO test strip USE STRIP TO  CHECK GLUCOSE AS DIRECTED ONCE DAILY IN THE MORNING  99  ? oxyCODONE (OXY IR/ROXICODONE) 5 MG immediate release tablet Take 5 mg by mouth every 4 (four) hours as needed.    ? potassium chloride (KLOR-CON) 10 MEQ tablet Take 10 mEq by mouth daily.    ? simvastatin (ZOCOR) 20 MG tablet Take 20 mg by mouth at bedtime.    ? vitamin B-12 (CYANOCOBALAMIN) 500 MCG tablet 1,000 mcg.    ? ?No current facility-administered medications on file prior to visit.  ? ? ?Allergies  ?Allergen Reactions  ? Hydrocodone Other (See Comments)  ?  Hallucinations  ? ? ?Objective: ?There were no vitals filed for this visit. ? ?General: No acute distress, AAOx3  ?Right foot: Ball of right foot there reactive keratotic skin once pared there was no underlying opening  previous wound is now healed no surrounding warmth redness minimal swelling there is significant digital deformity from previous amputation history with prominent metatarsal head.  ? ?Assessment and Plan:  ?Problem List Items Addressed This Visit   ? ?  ? Other  ? Amputated toe of right foot ([place])  ? ?Other Visit Diagnoses   ? ? Ulcer of right foot, limited to breakdown of skin ([place])    -  Primary  ? Prematurely healed  ? S/P foot surgery, right      ? Right foot pain      ? Diabetic polyneuropathy associated with type 2 diabetes mellitus ([place])      ? ?  ?  ? ?-Patient seen and evaluated ?-Using a 15 blade mechanically debrided keratotic skin which reveals no underlying opening previous wound is now healed.  Applied offloading padding and Coban to make sure it stays in place ?-Advised patient to continue with heel offloading shoe meanwhile awaiting custom diabetic insole to further offload the ball of the foot ?-Continue with walker for stability ?-Return to office when call for dispensing of insole or sooner if problems or issues arise ?Landis Martins, DPM ? ?

## 2022-02-07 ENCOUNTER — Telehealth: Payer: Self-pay

## 2022-02-07 NOTE — Telephone Encounter (Signed)
I have left a voicemail for patient to call back and schedule an appointment with previsit for a recall colonoscopy.  ?

## 2022-02-18 ENCOUNTER — Telehealth: Payer: Self-pay | Admitting: Sports Medicine

## 2022-02-18 NOTE — Telephone Encounter (Signed)
Pt called wanting to know if we have received her insert, and if she needs to get in this week to see you.  ?Please advise.  ?

## 2022-02-27 ENCOUNTER — Other Ambulatory Visit: Payer: Self-pay

## 2022-02-27 ENCOUNTER — Ambulatory Visit (INDEPENDENT_AMBULATORY_CARE_PROVIDER_SITE_OTHER): Payer: Medicare HMO

## 2022-02-27 DIAGNOSIS — S98131A Complete traumatic amputation of one right lesser toe, initial encounter: Secondary | ICD-10-CM

## 2022-02-27 DIAGNOSIS — E1142 Type 2 diabetes mellitus with diabetic polyneuropathy: Secondary | ICD-10-CM

## 2022-02-27 NOTE — Progress Notes (Signed)
SITUATION ?Reason for Visit: Fitting of Diabetic Insoles ?Patient / Caregiver Report:  Patient is satisfied with fit and function of insoles. ? ?OBJECTIVE DATA: ?Patient History / Diagnosis:   ?  ICD-10-CM   ?1. Diabetic polyneuropathy associated with type 2 diabetes mellitus ([place])  E11.42   ?  ?2. Amputated toe of right foot ([place])  S98.131A   ?  ? ? ?Change in Status:   None ? ?ACTIONS PERFORMED: ?In-Person Delivery, patient was fit with: ?- 3x pair [phone] PDAC approved vacuum formed custom diabetic insoles; [employer]: [id number] fit to existing shoes ? ?Shoes and insoles were verified for structural integrity and safety. Patient wore shoes and insoles in office. Skin was inspected and free of areas of concern after wearing shoes and inserts. Shoes and inserts fit properly. Patient / Caregiver provided with ferbal instruction and demonstration regarding donning, doffing, wear, care, proper fit, function, purpose, cleaning, and use of shoes and insoles ' and in all related precautions and risks and benefits regarding shoes and insoles. Patient / Caregiver was instructed to wear properly fitting socks with shoes at all times. Patient was also provided with verbal instruction regarding how to report any failures or malfunctions of shoes or inserts, and necessary follow up care. Patient / Caregiver was also instructed to contact physician regarding change in status that may affect function of shoes and inserts.  ? ?Patient / Caregiver verbalized undersatnding of instruction provided. Patient / Caregiver demonstrated independence with proper donning and doffing of shoes and inserts. ? ?PLAN ?Patient to follow with treating physician as recommended. Plan of care was discussed with and agreed upon by patient and/or caregiver. All questions were answered and concerns addressed. ? ?

## 2024-03-17 ENCOUNTER — Other Ambulatory Visit: Payer: Self-pay

## 2024-03-17 ENCOUNTER — Inpatient Hospital Stay (HOSPITAL_COMMUNITY)
Admission: EM | Admit: 2024-03-17 | Discharge: 2024-04-01 | DRG: 871 | Disposition: E | Attending: Internal Medicine | Admitting: Internal Medicine

## 2024-03-17 ENCOUNTER — Emergency Department (HOSPITAL_COMMUNITY)

## 2024-03-17 DIAGNOSIS — I452 Bifascicular block: Secondary | ICD-10-CM | POA: Diagnosis present

## 2024-03-17 DIAGNOSIS — E669 Obesity, unspecified: Secondary | ICD-10-CM | POA: Diagnosis present

## 2024-03-17 DIAGNOSIS — E039 Hypothyroidism, unspecified: Secondary | ICD-10-CM | POA: Diagnosis present

## 2024-03-17 DIAGNOSIS — E8721 Acute metabolic acidosis: Secondary | ICD-10-CM | POA: Diagnosis not present

## 2024-03-17 DIAGNOSIS — Z803 Family history of malignant neoplasm of breast: Secondary | ICD-10-CM

## 2024-03-17 DIAGNOSIS — E1140 Type 2 diabetes mellitus with diabetic neuropathy, unspecified: Secondary | ICD-10-CM | POA: Diagnosis present

## 2024-03-17 DIAGNOSIS — E1122 Type 2 diabetes mellitus with diabetic chronic kidney disease: Secondary | ICD-10-CM | POA: Diagnosis present

## 2024-03-17 DIAGNOSIS — R627 Adult failure to thrive: Secondary | ICD-10-CM | POA: Diagnosis present

## 2024-03-17 DIAGNOSIS — Z800 Family history of malignant neoplasm of digestive organs: Secondary | ICD-10-CM

## 2024-03-17 DIAGNOSIS — Z923 Personal history of irradiation: Secondary | ICD-10-CM

## 2024-03-17 DIAGNOSIS — D631 Anemia in chronic kidney disease: Secondary | ICD-10-CM | POA: Diagnosis present

## 2024-03-17 DIAGNOSIS — E872 Acidosis, unspecified: Secondary | ICD-10-CM | POA: Diagnosis present

## 2024-03-17 DIAGNOSIS — E871 Hypo-osmolality and hyponatremia: Secondary | ICD-10-CM | POA: Diagnosis present

## 2024-03-17 DIAGNOSIS — K761 Chronic passive congestion of liver: Secondary | ICD-10-CM | POA: Diagnosis present

## 2024-03-17 DIAGNOSIS — Z801 Family history of malignant neoplasm of trachea, bronchus and lung: Secondary | ICD-10-CM

## 2024-03-17 DIAGNOSIS — R197 Diarrhea, unspecified: Secondary | ICD-10-CM | POA: Diagnosis present

## 2024-03-17 DIAGNOSIS — Z85038 Personal history of other malignant neoplasm of large intestine: Secondary | ICD-10-CM

## 2024-03-17 DIAGNOSIS — J9601 Acute respiratory failure with hypoxia: Secondary | ICD-10-CM | POA: Diagnosis present

## 2024-03-17 DIAGNOSIS — R570 Cardiogenic shock: Secondary | ICD-10-CM | POA: Diagnosis present

## 2024-03-17 DIAGNOSIS — I5020 Unspecified systolic (congestive) heart failure: Secondary | ICD-10-CM

## 2024-03-17 DIAGNOSIS — Z9049 Acquired absence of other specified parts of digestive tract: Secondary | ICD-10-CM

## 2024-03-17 DIAGNOSIS — Z79899 Other long term (current) drug therapy: Secondary | ICD-10-CM

## 2024-03-17 DIAGNOSIS — I340 Nonrheumatic mitral (valve) insufficiency: Secondary | ICD-10-CM | POA: Diagnosis not present

## 2024-03-17 DIAGNOSIS — K7200 Acute and subacute hepatic failure without coma: Secondary | ICD-10-CM | POA: Diagnosis present

## 2024-03-17 DIAGNOSIS — E861 Hypovolemia: Secondary | ICD-10-CM | POA: Diagnosis present

## 2024-03-17 DIAGNOSIS — I4891 Unspecified atrial fibrillation: Secondary | ICD-10-CM | POA: Diagnosis not present

## 2024-03-17 DIAGNOSIS — E876 Hypokalemia: Secondary | ICD-10-CM | POA: Diagnosis present

## 2024-03-17 DIAGNOSIS — D509 Iron deficiency anemia, unspecified: Secondary | ICD-10-CM | POA: Diagnosis not present

## 2024-03-17 DIAGNOSIS — Z515 Encounter for palliative care: Secondary | ICD-10-CM | POA: Diagnosis not present

## 2024-03-17 DIAGNOSIS — N183 Chronic kidney disease, stage 3 unspecified: Secondary | ICD-10-CM | POA: Diagnosis not present

## 2024-03-17 DIAGNOSIS — I5023 Acute on chronic systolic (congestive) heart failure: Secondary | ICD-10-CM | POA: Diagnosis present

## 2024-03-17 DIAGNOSIS — R6521 Severe sepsis with septic shock: Secondary | ICD-10-CM

## 2024-03-17 DIAGNOSIS — Z90710 Acquired absence of both cervix and uterus: Secondary | ICD-10-CM

## 2024-03-17 DIAGNOSIS — I4821 Permanent atrial fibrillation: Secondary | ICD-10-CM | POA: Diagnosis present

## 2024-03-17 DIAGNOSIS — I5084 End stage heart failure: Secondary | ICD-10-CM | POA: Diagnosis present

## 2024-03-17 DIAGNOSIS — Z885 Allergy status to narcotic agent status: Secondary | ICD-10-CM

## 2024-03-17 DIAGNOSIS — Z7901 Long term (current) use of anticoagulants: Secondary | ICD-10-CM

## 2024-03-17 DIAGNOSIS — J189 Pneumonia, unspecified organism: Secondary | ICD-10-CM | POA: Diagnosis present

## 2024-03-17 DIAGNOSIS — N179 Acute kidney failure, unspecified: Secondary | ICD-10-CM | POA: Diagnosis present

## 2024-03-17 DIAGNOSIS — Z8542 Personal history of malignant neoplasm of other parts of uterus: Secondary | ICD-10-CM

## 2024-03-17 DIAGNOSIS — Z7984 Long term (current) use of oral hypoglycemic drugs: Secondary | ICD-10-CM

## 2024-03-17 DIAGNOSIS — N1832 Chronic kidney disease, stage 3b: Secondary | ICD-10-CM | POA: Diagnosis present

## 2024-03-17 DIAGNOSIS — Z8249 Family history of ischemic heart disease and other diseases of the circulatory system: Secondary | ICD-10-CM

## 2024-03-17 DIAGNOSIS — A419 Sepsis, unspecified organism: Principal | ICD-10-CM

## 2024-03-17 DIAGNOSIS — R5381 Other malaise: Secondary | ICD-10-CM | POA: Diagnosis present

## 2024-03-17 DIAGNOSIS — Z66 Do not resuscitate: Secondary | ICD-10-CM | POA: Diagnosis present

## 2024-03-17 DIAGNOSIS — I130 Hypertensive heart and chronic kidney disease with heart failure and stage 1 through stage 4 chronic kidney disease, or unspecified chronic kidney disease: Secondary | ICD-10-CM | POA: Diagnosis present

## 2024-03-17 DIAGNOSIS — Z833 Family history of diabetes mellitus: Secondary | ICD-10-CM

## 2024-03-17 DIAGNOSIS — E7800 Pure hypercholesterolemia, unspecified: Secondary | ICD-10-CM | POA: Diagnosis present

## 2024-03-17 DIAGNOSIS — R791 Abnormal coagulation profile: Secondary | ICD-10-CM | POA: Diagnosis present

## 2024-03-17 DIAGNOSIS — Z79890 Hormone replacement therapy (postmenopausal): Secondary | ICD-10-CM

## 2024-03-17 DIAGNOSIS — Z9221 Personal history of antineoplastic chemotherapy: Secondary | ICD-10-CM

## 2024-03-17 DIAGNOSIS — Z825 Family history of asthma and other chronic lower respiratory diseases: Secondary | ICD-10-CM

## 2024-03-17 LAB — I-STAT CHEM 8, ED
BUN: 44 mg/dL — ABNORMAL HIGH (ref 8–23)
Calcium, Ion: 1.09 mmol/L — ABNORMAL LOW (ref 1.15–1.40)
Chloride: 92 mmol/L — ABNORMAL LOW (ref 98–111)
Creatinine, Ser: 2.2 mg/dL — ABNORMAL HIGH (ref 0.44–1.00)
Glucose, Bld: 192 mg/dL — ABNORMAL HIGH (ref 70–99)
HCT: 32 % — ABNORMAL LOW (ref 36.0–46.0)
Hemoglobin: 10.9 g/dL — ABNORMAL LOW (ref 12.0–15.0)
Potassium: 4.2 mmol/L (ref 3.5–5.1)
Sodium: 123 mmol/L — ABNORMAL LOW (ref 135–145)
TCO2: 17 mmol/L — ABNORMAL LOW (ref 22–32)

## 2024-03-17 LAB — PROTIME-INR
INR: 2 — ABNORMAL HIGH (ref 0.8–1.2)
Prothrombin Time: 23 s — ABNORMAL HIGH (ref 11.4–15.2)

## 2024-03-17 LAB — LIPASE, BLOOD: Lipase: 17 U/L (ref 11–51)

## 2024-03-17 LAB — CBC WITH DIFFERENTIAL/PLATELET
Abs Immature Granulocytes: 0.24 10*3/uL — ABNORMAL HIGH (ref 0.00–0.07)
Basophils Absolute: 0.1 10*3/uL (ref 0.0–0.1)
Basophils Relative: 0 %
Eosinophils Absolute: 0 10*3/uL (ref 0.0–0.5)
Eosinophils Relative: 0 %
HCT: 29.8 % — ABNORMAL LOW (ref 36.0–46.0)
Hemoglobin: 8.7 g/dL — ABNORMAL LOW (ref 12.0–15.0)
Immature Granulocytes: 1 %
Lymphocytes Relative: 4 %
Lymphs Abs: 1.1 10*3/uL (ref 0.7–4.0)
MCH: 21.6 pg — ABNORMAL LOW (ref 26.0–34.0)
MCHC: 29.2 g/dL — ABNORMAL LOW (ref 30.0–36.0)
MCV: 74.1 fL — ABNORMAL LOW (ref 80.0–100.0)
Monocytes Absolute: 1.2 10*3/uL — ABNORMAL HIGH (ref 0.1–1.0)
Monocytes Relative: 5 %
Neutro Abs: 22.7 10*3/uL — ABNORMAL HIGH (ref 1.7–7.7)
Neutrophils Relative %: 90 %
Platelets: 311 10*3/uL (ref 150–400)
RBC: 4.02 MIL/uL (ref 3.87–5.11)
RDW: 17.3 % — ABNORMAL HIGH (ref 11.5–15.5)
WBC: 25.2 10*3/uL — ABNORMAL HIGH (ref 4.0–10.5)
nRBC: 0 % (ref 0.0–0.2)

## 2024-03-17 LAB — I-STAT CG4 LACTIC ACID, ED
Lactic Acid, Venous: 2.3 mmol/L (ref 0.5–1.9)
Lactic Acid, Venous: 2.8 mmol/L (ref 0.5–1.9)

## 2024-03-17 LAB — COMPREHENSIVE METABOLIC PANEL WITH GFR
ALT: 39 U/L (ref 0–44)
AST: 57 U/L — ABNORMAL HIGH (ref 15–41)
Albumin: 3 g/dL — ABNORMAL LOW (ref 3.5–5.0)
Alkaline Phosphatase: 100 U/L (ref 38–126)
Anion gap: 17 — ABNORMAL HIGH (ref 5–15)
BUN: 50 mg/dL — ABNORMAL HIGH (ref 8–23)
CO2: 18 mmol/L — ABNORMAL LOW (ref 22–32)
Calcium: 9.2 mg/dL (ref 8.9–10.3)
Chloride: 90 mmol/L — ABNORMAL LOW (ref 98–111)
Creatinine, Ser: 2.01 mg/dL — ABNORMAL HIGH (ref 0.44–1.00)
GFR, Estimated: 25 mL/min — ABNORMAL LOW (ref >60)
Glucose, Bld: 189 mg/dL — ABNORMAL HIGH (ref 70–99)
Potassium: 4.2 mmol/L (ref 3.5–5.1)
Sodium: 125 mmol/L — ABNORMAL LOW (ref 135–145)
Total Bilirubin: 0.8 mg/dL (ref 0.0–1.2)
Total Protein: 7.4 g/dL (ref 6.5–8.1)

## 2024-03-17 LAB — I-STAT VENOUS BLOOD GAS, ED
Acid-base deficit: 6 mmol/L — ABNORMAL HIGH (ref 0.0–2.0)
Bicarbonate: 18.4 mmol/L — ABNORMAL LOW (ref 20.0–28.0)
Calcium, Ion: 1.1 mmol/L — ABNORMAL LOW (ref 1.15–1.40)
HCT: 31 % — ABNORMAL LOW (ref 36.0–46.0)
Hemoglobin: 10.5 g/dL — ABNORMAL LOW (ref 12.0–15.0)
O2 Saturation: 80 %
Potassium: 4.2 mmol/L (ref 3.5–5.1)
Sodium: 124 mmol/L — ABNORMAL LOW (ref 135–145)
TCO2: 19 mmol/L — ABNORMAL LOW (ref 22–32)
pCO2, Ven: 30.2 mmHg — ABNORMAL LOW (ref 44–60)
pH, Ven: 7.391 (ref 7.25–7.43)
pO2, Ven: 44 mmHg (ref 32–45)

## 2024-03-17 LAB — RESP PANEL BY RT-PCR (RSV, FLU A&B, COVID)  RVPGX2
Influenza A by PCR: NEGATIVE
Influenza B by PCR: NEGATIVE
Resp Syncytial Virus by PCR: NEGATIVE
SARS Coronavirus 2 by RT PCR: NEGATIVE

## 2024-03-17 LAB — BRAIN NATRIURETIC PEPTIDE: B Natriuretic Peptide: 655.9 pg/mL — ABNORMAL HIGH (ref 0.0–100.0)

## 2024-03-17 LAB — PREPARE RBC (CROSSMATCH)

## 2024-03-17 LAB — CBG MONITORING, ED: Glucose-Capillary: 177 mg/dL — ABNORMAL HIGH (ref 70–99)

## 2024-03-17 LAB — MAGNESIUM: Magnesium: 1.6 mg/dL — ABNORMAL LOW (ref 1.7–2.4)

## 2024-03-17 LAB — POC OCCULT BLOOD, ED: Fecal Occult Bld: NEGATIVE

## 2024-03-17 MED ORDER — INSULIN ASPART 100 UNIT/ML IJ SOLN
0.0000 [IU] | INTRAMUSCULAR | Status: DC
  Administered 2024-03-18: 5 [IU] via SUBCUTANEOUS
  Administered 2024-03-18: 2 [IU] via SUBCUTANEOUS
  Administered 2024-03-18: 3 [IU] via SUBCUTANEOUS
  Administered 2024-03-18: 2 [IU] via SUBCUTANEOUS
  Administered 2024-03-18 (×2): 3 [IU] via SUBCUTANEOUS
  Administered 2024-03-19: 5 [IU] via SUBCUTANEOUS
  Administered 2024-03-19: 3 [IU] via SUBCUTANEOUS
  Administered 2024-03-19 (×2): 2 [IU] via SUBCUTANEOUS
  Administered 2024-03-19: 5 [IU] via SUBCUTANEOUS
  Administered 2024-03-19: 3 [IU] via SUBCUTANEOUS
  Administered 2024-03-20 (×2): 8 [IU] via SUBCUTANEOUS

## 2024-03-17 MED ORDER — LACTATED RINGERS IV BOLUS
20.0000 mL/kg | Freq: Once | INTRAVENOUS | Status: AC
  Administered 2024-03-17: 1932 mL via INTRAVENOUS

## 2024-03-17 MED ORDER — DOCUSATE SODIUM 100 MG PO CAPS: 100.0000 mg | ORAL_CAPSULE | Freq: Two times a day (BID) | ORAL | Status: DC | PRN

## 2024-03-17 MED ORDER — LACTATED RINGERS IV SOLN: INTRAVENOUS | Status: AC

## 2024-03-17 MED ORDER — AMIODARONE LOAD VIA INFUSION
150.0000 mg | Freq: Once | INTRAVENOUS | Status: AC
  Administered 2024-03-18: 150 mg via INTRAVENOUS
  Filled 2024-03-17: qty 83.34

## 2024-03-17 MED ORDER — AMIODARONE HCL IN DEXTROSE 360-4.14 MG/200ML-% IV SOLN
30.0000 mg/h | INTRAVENOUS | Status: DC
  Administered 2024-03-18 – 2024-03-20 (×5): 30 mg/h via INTRAVENOUS
  Filled 2024-03-17 (×5): qty 200

## 2024-03-17 MED ORDER — PHENYLEPHRINE HCL-NACL 20-0.9 MG/250ML-% IV SOLN
25.0000 ug/min | INTRAVENOUS | Status: DC
  Administered 2024-03-18: 50 ug/min via INTRAVENOUS
  Administered 2024-03-18: 25 ug/min via INTRAVENOUS
  Administered 2024-03-18: 55 ug/min via INTRAVENOUS
  Administered 2024-03-18 (×2): 60 ug/min via INTRAVENOUS
  Administered 2024-03-19: 50 ug/min via INTRAVENOUS
  Filled 2024-03-17 (×6): qty 250

## 2024-03-17 MED ORDER — POLYETHYLENE GLYCOL 3350 17 G PO PACK: 17.0000 g | PACK | Freq: Every day | ORAL | Status: DC | PRN

## 2024-03-17 MED ORDER — SODIUM CHLORIDE 0.9% IV SOLUTION: Freq: Once | INTRAVENOUS | Status: DC

## 2024-03-17 MED ORDER — ORAL CARE MOUTH RINSE: 15.0000 mL | OROMUCOSAL | Status: DC | PRN

## 2024-03-17 MED ORDER — SODIUM CHLORIDE 0.9 % IV SOLN
2.0000 g | Freq: Once | INTRAVENOUS | Status: AC
  Administered 2024-03-17: 2 g via INTRAVENOUS
  Filled 2024-03-17: qty 12.5

## 2024-03-17 MED ORDER — METRONIDAZOLE 500 MG/100ML IV SOLN
500.0000 mg | Freq: Once | INTRAVENOUS | Status: AC
  Administered 2024-03-18: 500 mg via INTRAVENOUS
  Filled 2024-03-17: qty 100

## 2024-03-17 MED ORDER — SODIUM CHLORIDE 0.9 % IV SOLN
250.0000 mL | INTRAVENOUS | Status: AC
  Administered 2024-03-18: 250 mL via INTRAVENOUS

## 2024-03-17 MED ORDER — AMIODARONE HCL IN DEXTROSE 360-4.14 MG/200ML-% IV SOLN
60.0000 mg/h | INTRAVENOUS | Status: AC
  Administered 2024-03-18 (×2): 60 mg/h via INTRAVENOUS
  Filled 2024-03-17: qty 200

## 2024-03-17 MED ORDER — CHLORHEXIDINE GLUCONATE CLOTH 2 % EX PADS
6.0000 | MEDICATED_PAD | Freq: Every day | CUTANEOUS | Status: DC
  Administered 2024-03-18 – 2024-03-19 (×2): 6 via TOPICAL

## 2024-03-17 MED ORDER — VANCOMYCIN HCL IN DEXTROSE 1-5 GM/200ML-% IV SOLN
1000.0000 mg | Freq: Once | INTRAVENOUS | Status: AC
Start: 2024-03-17 — End: 2024-03-18
  Administered 2024-03-18: 1000 mg via INTRAVENOUS
  Filled 2024-03-17: qty 200

## 2024-03-17 MED ORDER — MAGNESIUM SULFATE 2 GM/50ML IV SOLN
2.0000 g | Freq: Once | INTRAVENOUS | Status: AC
  Administered 2024-03-18: 2 g via INTRAVENOUS
  Filled 2024-03-17: qty 50

## 2024-03-17 NOTE — Sepsis Progress Note (Signed)
 Elink monitoring for the code sepsis protocol.

## 2024-03-17 NOTE — ED Triage Notes (Signed)
 Pt bibgcems from home, pt had syncopal episode. Pt did not hit head. Pt on warfarin and has not taajen any since Tuesday.  Afib rvr with ems bp 100 palpated. Hr 130-170 100% NRB

## 2024-03-17 NOTE — ED Provider Notes (Signed)
 [facility] Provider Note   CSN: [id number] Arrival date & time: 03/17/24  1949     History  Chief Complaint  Patient presents with   Tachycardia    Andrea Hoffman is a 78 y.o. female.  HPI The patient has been sick for about a week.  She has had diarrheal illness.  She has been having multiple episodes of diarrhea and difficulty eating as well.  She reports that the bowel movement has not been bloody or black in appearance.  It has been very liquidy brown stool.  She reports that she started to get very weak and ultimately today with family decided to come to the emergency department.  While using her walker and trying to ambulate to the car she became very lightheaded and very close to passing out.  Family members were able to assist her she did not hit her head.  At that time they called EMS for transport.  EMS arrived and found patient to be in rapid atrial fibrillation rates up to 170s with systolic blood pressure of 100.  Patient has history of atrial fibrillation.  She reports she was diagnosed about 3 years ago when she went in to have a foot surgery.  She reports she did not know that she had it and had been asymptomatic.  She is on Coumadin and Coreg.  She reports she cannot tell when she has atrial fibrillation and is not sure how often she is in it.  Since she has been sick, she has been taking her medications more intermittently.  She reports that last week she was told that her INR was high so she has not been taking her Coumadin but then was told to resume it so she took a dose yesterday.  She reports has been taking her blood pressure medications off and on depending upon how sick she has been feeling.    Home Medications Prior to Admission medications   Medication Sig Start Date End Date Taking? Authorizing Provider  allopurinol (ZYLOPRIM) 100 MG tablet Take 100 mg by mouth daily.    [provider]  Alpha-Lipoic Acid  200 MG CAPS Take 200-400 mg by mouth See admin instructions. Take 200 mg by mouth in the morning and take 400 mg by mouth at bedtime    [provider]  amLODipine (NORVASC) 10 MG tablet Take 10 mg by mouth daily.  07/01/16   [provider]  amoxicillin-clavulanate (AUGMENTIN) 875-125 MG tablet Take 1 tablet by mouth 2 (two) times daily. 07/01/21   [provider]  carvedilol (COREG) 6.25 MG tablet Take 6.25 mg by mouth 2 (two) times daily with a meal.  07/25/16   [provider]  celecoxib (CELEBREX) 200 MG capsule Take 200 mg by mouth 2 (two) times daily. 08/01/21   [provider]  citalopram (CELEXA) 40 MG tablet Take 0.5 tablets (20 mg total) by mouth at bedtime. Take 20 mg once daily 07/02/18   Cross, Melissa D, NP  ELIQUIS 5 MG TABS tablet Take 5 mg by mouth 2 (two) times daily. 07/01/21   [provider]  ferrous sulfate 324 MG TBEC 324 mg. 07/01/21   [provider]  furosemide (LASIX) 20 MG tablet Take 20 mg by mouth daily. 11/08/21   [provider]  glucose blood test strip  02/05/16   [provider]  hydrochlorothiazide (HYDRODIURIL) 25 MG tablet Take 25 mg by mouth daily.  06/24/16   [provider]  lisinopril (PRINIVIL,ZESTRIL) 20 MG tablet Take 20 mg by mouth daily.  06/24/16   [provider]  magnesium oxide (MAG-OX) 400 (240 Mg) MG tablet Take 1 tablet by mouth daily. 07/01/21   [provider]  metFORMIN (GLUCOPHAGE) 1000 MG tablet Take 1,000 mg by mouth 2 (two) times daily. 07/08/20   [provider]  metFORMIN (GLUCOPHAGE) 500 MG tablet 1,000 mg. 10/02/18   [provider]  Dola Argyle LANCETS 33G MISC  02/05/16   [provider]  [facility] VERIO test strip USE STRIP TO CHECK GLUCOSE AS DIRECTED ONCE DAILY IN THE MORNING 06/23/18   [provider]  oxyCODONE (OXY IR/ROXICODONE) 5 MG immediate release tablet Take 5 mg by mouth every 4 (four) hours as  needed. 08/01/21   [provider]  potassium chloride (KLOR-CON) 10 MEQ tablet Take 10 mEq by mouth daily. 07/01/21   [provider]  simvastatin (ZOCOR) 20 MG tablet Take 20 mg by mouth at bedtime. 08/01/20   [provider]  vitamin B-12 (CYANOCOBALAMIN) 500 MCG tablet 1,000 mcg. 07/01/21   [provider]      Allergies    Hydrocodone    Review of Systems   Review of Systems  Physical Exam Updated Vital Signs BP (!) 74/63 (BP Location: Left Arm)   Pulse (!) 142   Temp 97.9 F (36.6 C) (Oral)   Resp 18   Wt 96.6 kg   SpO2 97%   BMI 34.37 kg/m  Physical Exam Constitutional:      Comments: Patient has ill appearance.  She is very pale in appearance.  Patient is alert with clear mental status.  She does not have significant respiratory distress at rest.  HENT:     Head: Normocephalic and atraumatic.     Mouth/Throat:     Mouth: Mucous membranes are dry.     Pharynx: Oropharynx is clear.  Eyes:     Extraocular Movements: Extraocular movements intact.  Cardiovascular:     Rate and Rhythm: Tachycardia present. Rhythm irregular.  Pulmonary:     Comments: Mild increased work of breathing.  No gross crackles present.  Symmetric breath sounds. Abdominal:     General: There is no distension.     Palpations: Abdomen is soft.     Tenderness: There is no abdominal tenderness. There is no guarding.  Genitourinary:    Comments: Rectal exam: Brownish-yellow stool in the vault.  No melena or visible blood Musculoskeletal:     Comments: No peripheral edema.  Patient has had surgical removal of some digits on the right foot.  All wounds are well-healed.  There are no active wounds of the lower extremities.  No peripheral edema.  Skin:    Comments: Very pale, dry skin.  Neurological:     Comments: Does not have any focal neurologic deficits.  She is alert but ill appearance.  Generally weak but no focal motor deficit.     ED Results / Procedures /  Treatments   Labs (all labs ordered are listed, but only abnormal results are displayed) Labs Reviewed  COMPREHENSIVE METABOLIC PANEL WITH GFR - Abnormal; Notable for the following components:      Result Value   Sodium 125 (*)    Chloride 90 (*)    CO2 18 (*)    Glucose, Bld 189 (*)    BUN 50 (*)    Creatinine, Ser 2.01 (*)    Albumin 3.0 (*)    AST 57 (*)  GFR, Estimated 25 (*)    Anion gap 17 (*)    All other components within normal limits  CBC WITH DIFFERENTIAL/PLATELET - Abnormal; Notable for the following components:   WBC 25.2 (*)    Hemoglobin 8.7 (*)    HCT 29.8 (*)    MCV 74.1 (*)    MCH 21.6 (*)    MCHC 29.2 (*)    RDW 17.3 (*)    Neutro Abs 22.7 (*)    Monocytes Absolute 1.2 (*)    Abs Immature Granulocytes 0.24 (*)    All other components within normal limits  PROTIME-INR - Abnormal; Notable for the following components:   Prothrombin Time 23.0 (*)    INR 2.0 (*)    All other components within normal limits  BRAIN NATRIURETIC PEPTIDE - Abnormal; Notable for the following components:   B Natriuretic Peptide 655.9 (*)    All other components within normal limits  MAGNESIUM - Abnormal; Notable for the following components:   Magnesium 1.6 (*)    All other components within normal limits  I-STAT CHEM 8, ED - Abnormal; Notable for the following components:   Sodium 123 (*)    Chloride 92 (*)    BUN 44 (*)    Creatinine, Ser 2.20 (*)    Glucose, Bld 192 (*)    Calcium, Ion 1.09 (*)    TCO2 17 (*)    Hemoglobin 10.9 (*)    HCT 32.0 (*)    All other components within normal limits  I-STAT CG4 LACTIC ACID, ED - Abnormal; Notable for the following components:   Lactic Acid, Venous 2.3 (*)    All other components within normal limits  I-STAT VENOUS BLOOD GAS, ED - Abnormal; Notable for the following components:   pCO2, Ven 30.2 (*)    Bicarbonate 18.4 (*)    TCO2 19 (*)    Acid-base deficit 6.0 (*)    Sodium 124 (*)    Calcium, Ion 1.10 (*)    HCT 31.0  (*)    Hemoglobin 10.5 (*)    All other components within normal limits  I-STAT CG4 LACTIC ACID, ED - Abnormal; Notable for the following components:   Lactic Acid, Venous 2.8 (*)    All other components within normal limits  CBG MONITORING, ED - Abnormal; Notable for the following components:   Glucose-Capillary 177 (*)    All other components within normal limits  RESP PANEL BY RT-PCR (RSV, FLU A&B, COVID)  RVPGX2  CULTURE, BLOOD (ROUTINE X 2)  CULTURE, BLOOD (ROUTINE X 2)  LIPASE, BLOOD  URINALYSIS, W/ REFLEX TO CULTURE (INFECTION SUSPECTED)  CORTISOL  STREP PNEUMONIAE URINARY ANTIGEN  LEGIONELLA PNEUMOPHILA SEROGP 1 UR AG  CBC  BASIC METABOLIC PANEL WITH GFR  MAGNESIUM  PHOSPHORUS  HEMOGLOBIN A1C  POC OCCULT BLOOD, ED  I-STAT CG4 LACTIC ACID, ED  I-STAT CG4 LACTIC ACID, ED  TYPE AND SCREEN  PREPARE RBC (CROSSMATCH)    EKG EKG Interpretation Date/Time:  Wednesday March 17 2024 20:43:51 EDT Ventricular Rate:  135 PR Interval:    QRS Duration:  138 QT Interval:  357 QTC Calculation: 536 R Axis:   -58  Text Interpretation: Atrial fibrillation 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 morphology simialar to older tracing. now rapid afib. Confirmed by Wynetta Heckle [phone]) on 03/17/2024 10:12:31 PM  Radiology DG Chest Port 1 View Result Date: 03/17/2024 CLINICAL DATA:  dyspnea rapid afib EXAM: PORTABLE CHEST 1 VIEW COMPARISON:  Chest x-ray 08/27/2021 FINDINGS: Patient is rotated.  The heart and mediastinal contours are within normal limits. Question developing right mid lung zone airspace opacity. No pulmonary edema. No pleural effusion. No pneumothorax. No acute osseous abnormality. IMPRESSION: Question developing right mid lung zone airspace opacity. Limited evaluation due to patient rotation. Recommend repeat PA and lateral view of the chest with improved patient positioning. Electronically Signed   By: Tish Frederickson M.D.   On: 03/17/2024 22:17    Procedures Procedures   CRITICAL  CARE Performed by: Arby Barrette   Total critical care time: 60 minutes  Critical care time was exclusive of separately billable procedures and treating other patients.  Critical care was necessary to treat or prevent imminent or life-threatening deterioration.  Critical care was time spent personally by me on the following activities: development of treatment plan with patient and/or surrogate as well as nursing, discussions with consultants, evaluation of patient's response to treatment, examination of patient, obtaining history from patient or surrogate, ordering and performing treatments and interventions, ordering and review of laboratory studies, ordering and review of radiographic studies, pulse oximetry and re-evaluation of patient's condition.   Procedure note: Ultrasound Guided Peripheral IV Ultrasound guided right peripheral 1.88 inch angiocath IV placement performed by me. Indications: Nursing unable to place IV. Details: The antecubital fossa and upper arm were evaluated with a multifrequency linear probe. Patent brachial veins were noted. 2 attempt was made to cannulate a vein under realtime US guidance with successful cannulation of the vein and catheter placement. There is return of non-pulsatile dark red blood. The patient tolerated the procedure well without complications. Images archived electronically.  3 unsuccessful attempts made on left for second access.    CPT codes: 91478 and [phone]  Medications Ordered in ED Medications  lactated ringers infusion (has no administration in time range)  metroNIDAZOLE (FLAGYL) IVPB 500 mg (has no administration in time range)  vancomycin (VANCOCIN) IVPB 1000 mg/200 mL premix (has no administration in time range)  0.9 %  sodium chloride infusion (Manually program via Guardrails IV Fluids) (has no administration in time range)  magnesium sulfate IVPB 2 g 50 mL (has no administration in time range)  docusate sodium (COLACE) capsule 100  mg (has no administration in time range)  polyethylene glycol (MIRALAX / GLYCOLAX) packet 17 g (has no administration in time range)  0.9 %  sodium chloride infusion (has no administration in time range)  phenylephrine (NEO-SYNEPHRINE) 20mg /NS premix infusion (has no administration in time range)  insulin aspart (novoLOG) injection 0-15 Units (has no administration in time range)  lactated ringers bolus 1,932 mL (0 mLs Intravenous Stopped 03/17/24 2224)  ceFEPIme (MAXIPIME) 2 g in sodium chloride 0.9 % 100 mL IVPB (0 g Intravenous Stopped 03/17/24 2224)    ED Course/ Medical Decision Making/ A&P                                 Medical Decision Making Amount and/or Complexity of Data Reviewed Labs: ordered. Radiology: ordered.  Risk Prescription drug management. Decision regarding hospitalization.  Patient presents as outlined.  She has ill in appearance.  Patient is very pale.  She has had significant volume loss with diarrhea for several days.  Patient is also anticoagulated on Coumadin.  She does not report bloody stool however differential diagnosis includes symptomatic anemia\hypovolemia\sepsis\ACS with rapid atrial fibrillation.  On arrival heart rates ranging 140s to 160s.  Blood pressures 80s systolic.  Patient required placement of ultrasound IV.  Nursing unable to obtain peripheral IV.  I have obtained peripheral IV on the right and attempted second placement on left but unable to get blood return in thread.  Meanwhile, volume resuscitation initiated with lactated Ringer's.  Patient showing some positive response to volume resuscitation with heart rates coming down to 130s.  Blood pressure is 90 systolic.  Sepsis protocol initiated.  Unclear if patient is chronically in atrial fibrillation.  She is chronically anticoagulated on Coumadin and takes Coreg.  She is unsure.  She reports she has never been symptomatic or aware of when she is in A-fib.  We discussed possible  cardioversion in the event that she is not responding to fluids or has deterioration in condition.  Patient is anxious about this but aware of need for possible more advanced measures such as cardioversion or possibly intubation.  My concern is that with significant volume resuscitation patient may experience congestive heart failure.  Continue to observe closely for dyspnea.  Given risk of deterioration with sepsis\rapid atrial fibrillation and possible need for cardioversion or intubation, will consult intensivist for admission.  Patient did experience some symptomatic improvement with volume resuscitation.  At time of admission, she is not showing signs of acute failure.  She is maintaining oxygen saturation in high 90s with 3 L nasal cannula.  Consult: Intensivist Dr. Bernetta Brilliant, will evaluate patient in emergency department for admission.        Final Clinical Impression(s) / ED Diagnoses Final diagnoses:  Sepsis, due to unspecified organism, unspecified whether acute organ dysfunction present [facility])  Rapid atrial fibrillation (HCC)  Diarrhea of presumed infectious origin    Rx / DC Orders ED Discharge Orders     None         Wynetta Heckle, MD 03/17/24 2334

## 2024-03-17 NOTE — ED Notes (Signed)
 Only able to retreive blue top of cultures. Patients family did not want patient to be stuck again. MD pfeiffer advised.

## 2024-03-17 NOTE — ED Notes (Signed)
 IV team attempted to get blood cultures, only able to obtain partial set, phlebotomy aware and provider aware.

## 2024-03-17 NOTE — Consult Note (Signed)
 NAME:  Andrea Hoffman, MRN:  [id number], DOB:  June 02, 1946, LOS: 0 ADMISSION DATE:  03/17/2024, CONSULTATION DATE:  4/16 REFERRING MD:  Dr. Donnald Garre, CHIEF COMPLAINT:  Hypotension   History of Present Illness:  78 year old female with past medical history of below, which is significant for atrial fibrillation on warfarin, congestive heart failure, hypertension, diabetes, colon cancer status postsurgery and chemotherapy remote, endometrial cancer, and rheumatic fever.  She was in her usual state of health until 4/13 when she developed fevers, chills, malaise, vomiting, and diarrhea.  Diarrhea and emesis did not appear bloody.  She reports shortness of breath on good days, but has been worse since this illness started.  She has nonproductive cough as well.  Symptoms persisted until 4/16 when she suffered a syncopal episode and presented to [facility] emergency department.  Upon arrival to the ED she was noted to be tachycardic with rates up to the 170s in atrial fibrillation and was also hypotensive with systolic blood pressures in the 80s to low 100s.  She was given empiric antibiotics and 2 L of IV fluid without significant improvement of her blood pressure or heart rate.  PCCM is asked to admit to ICU.  Pertinent  Medical History   has a past medical history of Anemia, Broken arm, Bunion, Colon cancer (HCC), Cyanocobalamin deficiency, Depression, Diabetes mellitus without complication (HCC), Diabetic neuropathy (HCC), Dyspnea, Endometrial cancer (HCC), Hammer toe, High cholesterol, History of pneumonia, Hypertension, Migraines, Pulmonary nodules, and Rheumatic fever.   Significant Hospital Events: Including procedures, antibiotic start and stop dates in addition to other pertinent events     Interim History / Subjective:    Objective   Blood pressure 92/64, pulse (!) 142, temperature (!) 97.5 F (36.4 C), temperature source Oral, resp. rate 20, weight 96.6 kg, SpO2 96%.       No intake or  output data in the 24 hours ending 03/17/24 2224 Filed Weights   03/17/24 2000  Weight: 96.6 kg    Examination: General: Elderly pale appearing female. HENT: Normocephalic, atraumatic, PERRL, no JVD Lungs: Clear bilateral breath sounds Cardiovascular: Irregularly irregular with rates 130s to 150s.  No murmurs Abdomen: Soft, nontender, nondistended Extremities: No acute deformity, range of motion limitation, or edema Neuro: Hard of hearing, alert, oriented, nonfocal   Resolved Hospital Problem list     Assessment & Plan:   Septic shock: Source not entirely clear but with WBC 25 and fevers seems likely the etiology of shock is infectious.  CXR showing possibly pneumonia. No urine studies yet, but denies dysuria. Diarrhea, but abdominal exam is benign. She does have a history of congestive heart failure and rapid atrial fibrillation which may be contributing to the hypotension as well. - Admit to ICU - Start phenylephrine infusion for MAP goal 65 (Neo due to AR RVR) - Continue cefepime, Flagyl, vancomycin  - Blood cultures - UA pending - Check stool studies - Urine strep and legionella - Echocardiogram - Cortisol - Repeat lactic acid  Atrial fibrillation with RVR: known AF history. Anticoagulated with warfarin.  - Tele monitoring - Amiodarone bolus and infusion - Check troponin - She is OK with cardioversion if necessary - Keep K > 4 and mag > 2  HFrEF acuity uncertain: echo from 2018 showing normal EF, but family reports a recent admission here for heart failure (within the past couple years). I cannot find records of this and do not see another chart under her name or birth date belonging to her in the  EMR.  BNP 655 ut no baseline to compare. No edema and she appears hypovolemic - euvolemic on exam.  - Holding home amlodipine, coreg, lisinopril, hydrochlorothiazide, and furosemide.  - Echocardiogram pending  AKI: shock, hypovolemia Hypomagnesemia - give mag - Trend  BMP  Hypothyroid: may have been taking double dose of levothyroxine? She says no but seems unsure.  - Check TSH - hold synthroid until we know for sure  Anemia: hgb 8.7 down from the 10s last week. No evidence of bleeding. Appears pale.  - ED giving one unit PRBC - Trend CBC - Send anemia panel  DM - CBG monitoring and SSI  GOC: patient has living will that indicates DNR/DNI. Would be OK with cardioversion and pressors if necessary.   Best Practice (right click and "Reselect all SmartList Selections" daily)   Diet/type: NPO DVT prophylaxis systemic heparin Pressure ulcer(s): pressure ulcer assessment deferred  GI prophylaxis: H2B Lines: N/A Foley:  N/A Code Status:  full code Last date of multidisciplinary goals of care discussion [ ]   Labs   CBC: Recent Labs  Lab 03/17/24 2027 03/17/24 2045  WBC  --  25.2*  NEUTROABS  --  22.7*  HGB 10.9*  10.5* 8.7*  HCT 32.0*  31.0* 29.8*  MCV  --  74.1*  PLT  --  311    Basic Metabolic Panel: Recent Labs  Lab 03/17/24 2027 03/17/24 2045  NA 123*  124* 125*  K 4.2  4.2 4.2  CL 92* 90*  CO2  --  18*  GLUCOSE 192* 189*  BUN 44* 50*  CREATININE 2.20* 2.01*  CALCIUM  --  9.2   GFR: CrCl cannot be calculated (Unknown ideal weight.). Recent Labs  Lab 03/17/24 2028 03/17/24 2045  WBC  --  25.2*  LATICACIDVEN 2.3*  --     Liver Function Tests: Recent Labs  Lab 03/17/24 2045  AST 57*  ALT 39  ALKPHOS 100  BILITOT 0.8  PROT 7.4  ALBUMIN 3.0*   Recent Labs  Lab 03/17/24 2045  LIPASE 17   No results for input(s): "AMMONIA" in the last 168 hours.  ABG    Component Value Date/Time   HCO3 18.4 (L) 03/17/2024 2027   TCO2 17 (L) 03/17/2024 2027   TCO2 19 (L) 03/17/2024 2027   ACIDBASEDEF 6.0 (H) 03/17/2024 2027   O2SAT 80 03/17/2024 2027     Coagulation Profile: Recent Labs  Lab 03/17/24 2045  INR 2.0*    Cardiac Enzymes: No results for input(s): "CKTOTAL", "CKMB", "CKMBINDEX", "TROPONINI" in  the last 168 hours.  HbA1C: Hgb A1c MFr Bld  Date/Time Value Ref Range Status  06/15/2018 12:33 PM 7.0 (H) 4.8 - 5.6 % Final    Comment:    (NOTE) Pre diabetes:          5.7%-6.4% Diabetes:              >6.4% Glycemic control for   <7.0% adults with diabetes     CBG: Recent Labs  Lab 03/17/24 2105  GLUCAP 177*    Review of Systems:   Bolds are positive  Constitutional: weight loss, gain, night sweats, Fevers, chills, fatigue .  HEENT: headaches, Sore throat, sneezing, nasal congestion, post nasal drip, Difficulty swallowing, Tooth/dental problems, visual complaints visual changes, ear ache CV:  chest pain, radiates:,Orthopnea, PND, swelling in lower extremities, dizziness, palpitations, syncope.  GI  heartburn, indigestion, abdominal pain, nausea, vomiting, diarrhea, change in bowel habits, loss of appetite, bloody stools.  Resp: cough, productive: ,  hemoptysis, dyspnea, chest pain, pleuritic.  Skin: rash or itching or icterus GU: dysuria, change in color of urine, urgency or frequency. flank pain, hematuria  MS: joint pain or swelling. decreased range of motion  Psych: change in mood or affect. depression or anxiety.  Neuro: difficulty with speech, weakness, numbness, ataxia    Past Medical History:  She,  has a past medical history of Anemia, Broken arm, Bunion, Colon cancer (HCC), Cyanocobalamin deficiency, Depression, Diabetes mellitus without complication (HCC), Diabetic neuropathy (HCC), Dyspnea, Endometrial cancer (HCC), Hammer toe, High cholesterol, History of pneumonia, Hypertension, Migraines, Pulmonary nodules, and Rheumatic fever.   Surgical History:   Past Surgical History:  Procedure Laterality Date   AMPUTATION TOE  2014   2 toes amputated   COLONOSCOPY     LAPAROTOMY WITH STAGING N/A 07/01/2018   Procedure: POSSIBLE LAPAROTOMY WITH STAGING;  Surgeon: Lyn Sanders, MD;  Location: [facility];  Service: Gynecology;  Laterality: N/A;   PARTIAL COLECTOMY   2002 or 2003   Colon cancer [facility] - "took 18-22 inches"   ROBOTIC ASSISTED TOTAL HYSTERECTOMY WITH BILATERAL SALPINGO OOPHERECTOMY Bilateral 07/01/2018   Procedure: XI ROBOTIC ASSISTED TOTAL HYSTERECTOMY WITH BILATERAL SALPINGO OOPHORECTOMY;  Surgeon: Lyn Sanders, MD;  Location: [facility];  Service: Gynecology;  Laterality: Bilateral;   SENTINEL NODE BIOPSY N/A 07/01/2018   Procedure: SENTINEL NODE BIOPSY;  Surgeon: Lyn Sanders, MD;  Location: [facility];  Service: Gynecology;  Laterality: N/A;   TONSILLECTOMY     Patient had her tonsils taken out at age 42   TUBAL LIGATION  1974   UPPER GI ENDOSCOPY       Social History:   reports that she has never smoked. She has never used smokeless tobacco. She reports that she does not drink alcohol and does not use drugs.   Family History:  Her family history includes Breast cancer in her cousin, maternal aunt, and maternal aunt; Breast cancer (age of onset: 21) in her mother; COPD in her maternal grandfather; Cancer in her cousin and cousin; Colon cancer in her maternal uncle and maternal uncle; Colon polyps in her brother; Diabetes in her brother and maternal grandmother; Heart attack in her father; Lung cancer in her mother; Other in her paternal grandmother.   Allergies Allergies  Allergen Reactions   Hydrocodone Other (See Comments)    Hallucinations     Home Medications  Prior to Admission medications   Medication Sig Start Date End Date Taking? Authorizing Provider  allopurinol (ZYLOPRIM) 100 MG tablet Take 100 mg by mouth daily.    [provider]  Alpha-Lipoic Acid 200 MG CAPS Take 200-400 mg by mouth See admin instructions. Take 200 mg by mouth in the morning and take 400 mg by mouth at bedtime    [provider]  amLODipine (NORVASC) 10 MG tablet Take 10 mg by mouth daily.  07/01/16   [provider]  amoxicillin-clavulanate (AUGMENTIN) 875-125 MG tablet Take 1 tablet by mouth 2 (two) times daily.  07/01/21   [provider]  carvedilol (COREG) 6.25 MG tablet Take 6.25 mg by mouth 2 (two) times daily with a meal.  07/25/16   [provider]  celecoxib (CELEBREX) 200 MG capsule Take 200 mg by mouth 2 (two) times daily. 08/01/21   [provider]  citalopram (CELEXA) 40 MG tablet Take 0.5 tablets (20 mg total) by mouth at bedtime. Take 20 mg once daily 07/02/18   Cross, Melissa D, NP  ELIQUIS 5 MG  TABS tablet Take 5 mg by mouth 2 (two) times daily. 07/01/21   [provider]  ferrous sulfate 324 MG TBEC 324 mg. 07/01/21   [provider]  furosemide (LASIX) 20 MG tablet Take 20 mg by mouth daily. 11/08/21   [provider]  glucose blood test strip  02/05/16   [provider]  hydrochlorothiazide (HYDRODIURIL) 25 MG tablet Take 25 mg by mouth daily.  06/24/16   [provider]  lisinopril (PRINIVIL,ZESTRIL) 20 MG tablet Take 20 mg by mouth daily.  06/24/16   [provider]  magnesium oxide (MAG-OX) 400 (240 Mg) MG tablet Take 1 tablet by mouth daily. 07/01/21   [provider]  metFORMIN (GLUCOPHAGE) 1000 MG tablet Take 1,000 mg by mouth 2 (two) times daily. 07/08/20   [provider]  metFORMIN (GLUCOPHAGE) 500 MG tablet 1,000 mg. 10/02/18   [provider]  [facility] Bullion LANCETS 33G MISC  02/05/16   [provider]  [facility] VERIO test strip USE STRIP TO CHECK GLUCOSE AS DIRECTED ONCE DAILY IN THE MORNING 06/23/18   [provider]  oxyCODONE (OXY IR/ROXICODONE) 5 MG immediate release tablet Take 5 mg by mouth every 4 (four) hours as needed. 08/01/21   [provider]  potassium chloride (KLOR-CON) 10 MEQ tablet Take 10 mEq by mouth daily. 07/01/21   [provider]  simvastatin (ZOCOR) 20 MG tablet Take 20 mg by mouth at bedtime. 08/01/20   [provider]  vitamin B-12 (CYANOCOBALAMIN) 500 MCG tablet 1,000 mcg. 07/01/21   [provider]      Critical care time: 51 minutes     Roz Cornelia, AGACNP-BC [facility]  See Amion for personal pager PCCM on call pager [phone] until 7pm. Please call Elink 7p-7a. [phone]  03/17/2024 11:55 PM

## 2024-03-18 ENCOUNTER — Other Ambulatory Visit (HOSPITAL_COMMUNITY)

## 2024-03-18 ENCOUNTER — Inpatient Hospital Stay (HOSPITAL_COMMUNITY)

## 2024-03-18 DIAGNOSIS — E871 Hypo-osmolality and hyponatremia: Secondary | ICD-10-CM

## 2024-03-18 DIAGNOSIS — R6521 Severe sepsis with septic shock: Secondary | ICD-10-CM | POA: Diagnosis not present

## 2024-03-18 DIAGNOSIS — E8721 Acute metabolic acidosis: Secondary | ICD-10-CM | POA: Diagnosis not present

## 2024-03-18 DIAGNOSIS — I4891 Unspecified atrial fibrillation: Secondary | ICD-10-CM | POA: Diagnosis not present

## 2024-03-18 DIAGNOSIS — D509 Iron deficiency anemia, unspecified: Secondary | ICD-10-CM

## 2024-03-18 DIAGNOSIS — A419 Sepsis, unspecified organism: Secondary | ICD-10-CM | POA: Diagnosis not present

## 2024-03-18 LAB — CBC
HCT: 27.4 % — ABNORMAL LOW (ref 36.0–46.0)
Hemoglobin: 8.3 g/dL — ABNORMAL LOW (ref 12.0–15.0)
MCH: 22.1 pg — ABNORMAL LOW (ref 26.0–34.0)
MCHC: 30.3 g/dL (ref 30.0–36.0)
MCV: 72.9 fL — ABNORMAL LOW (ref 80.0–100.0)
Platelets: 313 10*3/uL (ref 150–400)
RBC: 3.76 MIL/uL — ABNORMAL LOW (ref 3.87–5.11)
RDW: 17.5 % — ABNORMAL HIGH (ref 11.5–15.5)
WBC: 24.6 10*3/uL — ABNORMAL HIGH (ref 4.0–10.5)
nRBC: 0 % (ref 0.0–0.2)

## 2024-03-18 LAB — BPAM RBC
Blood Product Expiration Date: 202505182359
ISSUE DATE / TIME: 202504162324
Unit Type and Rh: 5100

## 2024-03-18 LAB — RETICULOCYTES
Immature Retic Fract: 31.8 % — ABNORMAL HIGH (ref 2.3–15.9)
RBC.: 3.71 MIL/uL — ABNORMAL LOW (ref 3.87–5.11)
Retic Count, Absolute: 55.7 10*3/uL (ref 19.0–186.0)
Retic Ct Pct: 1.5 % (ref 0.4–3.1)

## 2024-03-18 LAB — ECHOCARDIOGRAM COMPLETE
AR max vel: 2.2 cm2
AV Area VTI: 2.3 cm2
AV Area mean vel: 2.3 cm2
AV Mean grad: 4 mmHg
AV Peak grad: 6.7 mmHg
Ao pk vel: 1.29 m/s
Area-P 1/2: 3.78 cm2
Calc EF: 30.8 %
Height: 66 in
MV M vel: 4.36 m/s
MV Peak grad: 76 mmHg
MV VTI: 1.61 cm2
Radius: 0.4 cm
S' Lateral: 3.7 cm
Single Plane A2C EF: 15.1 %
Single Plane A4C EF: 45.8 %
Weight: 3068.8 [oz_av]

## 2024-03-18 LAB — TYPE AND SCREEN
ABO/RH(D): O POS
Antibody Screen: NEGATIVE
Unit division: 0

## 2024-03-18 LAB — LACTIC ACID, PLASMA
Lactic Acid, Venous: 2.3 mmol/L (ref 0.5–1.9)
Lactic Acid, Venous: 4.6 mmol/L (ref 0.5–1.9)

## 2024-03-18 LAB — POCT I-STAT 7, (LYTES, BLD GAS, ICA,H+H)
Acid-base deficit: 7 mmol/L — ABNORMAL HIGH (ref 0.0–2.0)
Bicarbonate: 17.8 mmol/L — ABNORMAL LOW (ref 20.0–28.0)
Calcium, Ion: 1.16 mmol/L (ref 1.15–1.40)
HCT: 27 % — ABNORMAL LOW (ref 36.0–46.0)
Hemoglobin: 9.2 g/dL — ABNORMAL LOW (ref 12.0–15.0)
O2 Saturation: 100 %
Patient temperature: 98.2
Potassium: 4.1 mmol/L (ref 3.5–5.1)
Sodium: 123 mmol/L — ABNORMAL LOW (ref 135–145)
TCO2: 19 mmol/L — ABNORMAL LOW (ref 22–32)
pCO2 arterial: 30 mmHg — ABNORMAL LOW (ref 32–48)
pH, Arterial: 7.38 (ref 7.35–7.45)
pO2, Arterial: 301 mmHg — ABNORMAL HIGH (ref 83–108)

## 2024-03-18 LAB — BASIC METABOLIC PANEL WITH GFR
Anion gap: 18 — ABNORMAL HIGH (ref 5–15)
BUN: 50 mg/dL — ABNORMAL HIGH (ref 8–23)
CO2: 16 mmol/L — ABNORMAL LOW (ref 22–32)
Calcium: 8.6 mg/dL — ABNORMAL LOW (ref 8.9–10.3)
Chloride: 90 mmol/L — ABNORMAL LOW (ref 98–111)
Creatinine, Ser: 1.93 mg/dL — ABNORMAL HIGH (ref 0.44–1.00)
GFR, Estimated: 26 mL/min — ABNORMAL LOW (ref >60)
Glucose, Bld: 188 mg/dL — ABNORMAL HIGH (ref 70–99)
Potassium: 4 mmol/L (ref 3.5–5.1)
Sodium: 124 mmol/L — ABNORMAL LOW (ref 135–145)

## 2024-03-18 LAB — GLUCOSE, CAPILLARY
Glucose-Capillary: 119 mg/dL — ABNORMAL HIGH (ref 70–99)
Glucose-Capillary: 123 mg/dL — ABNORMAL HIGH (ref 70–99)
Glucose-Capillary: 143 mg/dL — ABNORMAL HIGH (ref 70–99)
Glucose-Capillary: 167 mg/dL — ABNORMAL HIGH (ref 70–99)
Glucose-Capillary: 178 mg/dL — ABNORMAL HIGH (ref 70–99)
Glucose-Capillary: 194 mg/dL — ABNORMAL HIGH (ref 70–99)
Glucose-Capillary: 221 mg/dL — ABNORMAL HIGH (ref 70–99)

## 2024-03-18 LAB — TROPONIN I (HIGH SENSITIVITY)
Troponin I (High Sensitivity): 23 ng/L — ABNORMAL HIGH (ref <18)
Troponin I (High Sensitivity): 29 ng/L — ABNORMAL HIGH (ref <18)

## 2024-03-18 LAB — VITAMIN B12: Vitamin B-12: 323 pg/mL (ref 180–914)

## 2024-03-18 LAB — MRSA NEXT GEN BY PCR, NASAL: MRSA by PCR Next Gen: NOT DETECTED

## 2024-03-18 LAB — PROTIME-INR
INR: 2.6 — ABNORMAL HIGH (ref 0.8–1.2)
Prothrombin Time: 27.9 s — ABNORMAL HIGH (ref 11.4–15.2)

## 2024-03-18 LAB — IRON AND TIBC
Iron: 8 ug/dL — ABNORMAL LOW (ref 28–170)
Saturation Ratios: 3 % — ABNORMAL LOW (ref 10.4–31.8)
TIBC: 276 ug/dL (ref 250–450)
UIBC: 268 ug/dL

## 2024-03-18 LAB — CORTISOL: Cortisol, Plasma: 69.8 ug/dL

## 2024-03-18 LAB — HEMOGLOBIN A1C
Hgb A1c MFr Bld: 6.3 % — ABNORMAL HIGH (ref 4.8–5.6)
Mean Plasma Glucose: 134.11 mg/dL

## 2024-03-18 LAB — HEMOGLOBIN AND HEMATOCRIT, BLOOD
HCT: 29.4 % — ABNORMAL LOW (ref 36.0–46.0)
Hemoglobin: 9.1 g/dL — ABNORMAL LOW (ref 12.0–15.0)

## 2024-03-18 LAB — TSH: TSH: 2.144 u[IU]/mL (ref 0.350–4.500)

## 2024-03-18 LAB — FERRITIN: Ferritin: 171 ng/mL (ref 11–307)

## 2024-03-18 LAB — FOLATE: Folate: 19.4 ng/mL (ref >5.9)

## 2024-03-18 LAB — PHOSPHORUS: Phosphorus: 4.3 mg/dL (ref 2.5–4.6)

## 2024-03-18 LAB — MAGNESIUM: Magnesium: 1.5 mg/dL — ABNORMAL LOW (ref 1.7–2.4)

## 2024-03-18 MED ORDER — PROCHLORPERAZINE EDISYLATE 10 MG/2ML IJ SOLN
10.0000 mg | Freq: Once | INTRAMUSCULAR | Status: AC
  Administered 2024-03-18: 10 mg via INTRAVENOUS
  Filled 2024-03-18: qty 2

## 2024-03-18 MED ORDER — LACTATED RINGERS IV BOLUS
1000.0000 mL | Freq: Once | INTRAVENOUS | Status: AC
  Administered 2024-03-18: 1000 mL via INTRAVENOUS

## 2024-03-18 MED ORDER — MAGNESIUM SULFATE 50 % IJ SOLN: 6.0000 g | Freq: Once | INTRAVENOUS | Status: DC

## 2024-03-18 MED ORDER — VANCOMYCIN HCL IN DEXTROSE 1-5 GM/200ML-% IV SOLN: 1000.0000 mg | INTRAVENOUS | Status: DC

## 2024-03-18 MED ORDER — MAGNESIUM SULFATE 2 GM/50ML IV SOLN
2.0000 g | Freq: Once | INTRAVENOUS | Status: AC
  Administered 2024-03-18: 2 g via INTRAVENOUS
  Filled 2024-03-18: qty 50

## 2024-03-18 MED ORDER — ONDANSETRON HCL 4 MG/2ML IJ SOLN
4.0000 mg | Freq: Once | INTRAMUSCULAR | Status: AC
  Administered 2024-03-18: 4 mg via INTRAVENOUS

## 2024-03-18 MED ORDER — SODIUM CHLORIDE 0.9 % IV SOLN
500.0000 mg | INTRAVENOUS | Status: DC
  Administered 2024-03-18 – 2024-03-19 (×2): 500 mg via INTRAVENOUS
  Filled 2024-03-18 (×2): qty 5

## 2024-03-18 MED ORDER — SODIUM BICARBONATE 8.4 % IV SOLN
INTRAVENOUS | Status: AC
  Filled 2024-03-18: qty 1000
  Filled 2024-03-18: qty 150

## 2024-03-18 MED ORDER — LEVOTHYROXINE SODIUM 50 MCG PO TABS
50.0000 ug | ORAL_TABLET | Freq: Every day | ORAL | Status: DC
  Administered 2024-03-19: 50 ug via ORAL
  Filled 2024-03-18 (×2): qty 1

## 2024-03-18 MED ORDER — METRONIDAZOLE 500 MG/100ML IV SOLN: 500.0000 mg | Freq: Two times a day (BID) | INTRAVENOUS | Status: DC

## 2024-03-18 MED ORDER — SODIUM CHLORIDE 0.9 % IV SOLN
2.0000 g | INTRAVENOUS | Status: DC
  Administered 2024-03-18: 2 g via INTRAVENOUS
  Filled 2024-03-18: qty 12.5

## 2024-03-18 MED ORDER — FAMOTIDINE IN NACL 20-0.9 MG/50ML-% IV SOLN
20.0000 mg | INTRAVENOUS | Status: DC
  Administered 2024-03-18 – 2024-03-19 (×2): 20 mg via INTRAVENOUS
  Filled 2024-03-18 (×2): qty 50

## 2024-03-18 MED ORDER — PERFLUTREN LIPID MICROSPHERE
1.0000 mL | INTRAVENOUS | Status: AC | PRN
  Administered 2024-03-18: 2 mL via INTRAVENOUS

## 2024-03-18 MED ORDER — FERROUS SULFATE 325 (65 FE) MG PO TABS
325.0000 mg | ORAL_TABLET | Freq: Two times a day (BID) | ORAL | Status: DC
  Administered 2024-03-18 – 2024-03-19 (×3): 325 mg via ORAL
  Filled 2024-03-18 (×3): qty 1

## 2024-03-18 MED ORDER — MAGNESIUM SULFATE 4 GM/100ML IV SOLN
4.0000 g | Freq: Once | INTRAVENOUS | Status: AC
  Administered 2024-03-18: 4 g via INTRAVENOUS
  Filled 2024-03-18: qty 100

## 2024-03-18 NOTE — Progress Notes (Signed)
 Updated husband at the bedside about her shock, AKI, Afib, and interventions so far.

## 2024-03-18 NOTE — Evaluation (Signed)
 Clinical/Bedside Swallow Evaluation Patient Details  Name: Andrea Hoffman MRN: [id number] Date of Birth: 12-16-1945  Today's Date: 03/18/2024 Time: SLP Start Time (ACUTE ONLY): 1327 SLP Stop Time (ACUTE ONLY): 1338 SLP Time Calculation (min) (ACUTE ONLY): 11 min  Past Medical History:  Past Medical History:  Diagnosis Date   Anemia    Broken arm    Right shoulder   Bunion    Colon cancer (HCC)    surgery followed by chemo for "6 months"   Cyanocobalamin deficiency    Depression    Diabetes mellitus without complication (HCC)    Diabetic neuropathy (HCC)    Dyspnea    with exertion   Endometrial cancer (HCC)    Hammer toe    High cholesterol    History of pneumonia    Hypertension    Migraines    history of   Pulmonary nodules    Numerous small   Rheumatic fever    age 24   Past Surgical History:  Past Surgical History:  Procedure Laterality Date   AMPUTATION TOE  2014   2 toes amputated   COLONOSCOPY     LAPAROTOMY WITH STAGING N/A 07/01/2018   Procedure: POSSIBLE LAPAROTOMY WITH STAGING;  Surgeon: Shonna Chock, MD;  Location: [facility];  Service: Gynecology;  Laterality: N/A;   PARTIAL COLECTOMY  2002 or 2003   Colon cancer [facility] - "took 18-22 inches"   ROBOTIC ASSISTED TOTAL HYSTERECTOMY WITH BILATERAL SALPINGO OOPHERECTOMY Bilateral 07/01/2018   Procedure: XI ROBOTIC ASSISTED TOTAL HYSTERECTOMY WITH BILATERAL SALPINGO OOPHORECTOMY;  Surgeon: Shonna Chock, MD;  Location: [facility];  Service: Gynecology;  Laterality: Bilateral;   SENTINEL NODE BIOPSY N/A 07/01/2018   Procedure: SENTINEL NODE BIOPSY;  Surgeon: Shonna Chock, MD;  Location: [facility];  Service: Gynecology;  Laterality: N/A;   TONSILLECTOMY     Patient had her tonsils taken out at age 72   TUBAL LIGATION  1974   UPPER GI ENDOSCOPY     HPI:  78 year old female presented to ED with chills/fever, vomiting, diarrhea, tachycardia. Dx with septic shock, afib with RVR, AKI. On enteral  precautions. PMHx afib on warfarin, congestive heart failure, hypertension, diabetes, colon cancer status postsurgery and chemotherapy remote, endometrial cancer, and rheumatic fever.    Assessment / Plan / Recommendation  Clinical Impression  Pt participated in clinical swallowing assessment.  Daughter at bedside.  HOB elevated to maximize safety. Currently with nasal cannula in place. RR low thirties.  Oral mechanism exam was normal. She accepted ice chips, small sips, then sequential sips of water, eventually finishing eight ounces with no s/s of aspiration. She self-fed applesauce without concern for dysphagia. She reports no hx of dysphagia. At end of session there was a brief mild cough, but it didn't appear to correlate with PO intake.    Recommend resuming an oral diet - start with clears or full liquids per medical team.  Sit upright for all PO intake.  If rate of breathing interferes with swallowing coordination, hold POs until she is more comfortable. Messaged RN to inform her of results/recs.  SLP will follow briefly to ensure safety and to advance diet if warranted.  SLP Visit Diagnosis: Dysphagia, unspecified (R13.10)    Aspiration Risk       Diet Recommendation   Other -clears vs full liquids  Medication Administration: Whole meds with puree    Other  Recommendations Oral Care Recommendations: Oral care BID    Recommendations for follow up therapy  are one component of a multi-disciplinary discharge planning process, led by the attending physician.  Recommendations may be updated based on patient status, additional functional criteria and insurance authorization.  Follow up Recommendations No SLP follow up              Frequency and Duration min 1 x/week  1 week       Prognosis Prognosis for improved oropharyngeal function: Good      Swallow Study   General Date of Onset: 03/17/24 HPI: 78 year old female presented to ED with chills/fever, vomiting, diarrhea,  tachycardia. Dx with septic shock, afib with RVR, AKI.  PMHx afib on warfarin, congestive heart failure, hypertension, diabetes, colon cancer status postsurgery and chemotherapy remote, endometrial cancer, and rheumatic fever. Type of Study: Bedside Swallow Evaluation Previous Swallow Assessment: no Diet Prior to this Study: NPO Temperature Spikes Noted: No Respiratory Status: Nasal cannula History of Recent Intubation: No Behavior/Cognition: Alert Oral Cavity Assessment: Within Functional Limits Oral Care Completed by SLP: No Oral Cavity - Dentition: Adequate natural dentition Vision: Functional for self-feeding Self-Feeding Abilities: Able to feed self Patient Positioning: Upright in bed Baseline Vocal Quality: Normal Volitional Cough: Strong Volitional Swallow: Able to elicit    Oral/Motor/Sensory Function Overall Oral Motor/Sensory Function: Within functional limits   Ice Chips Ice chips: Within functional limits   Thin Liquid Thin Liquid: Within functional limits    Nectar Thick Nectar Thick Liquid: Not tested   Honey Thick Honey Thick Liquid: Not tested   Puree Puree: Within functional limits   Solid     Solid: Not tested      Myna Asal Laurice 03/18/2024,1:58 PM   Mylinda Asa L. Beatris Lincoln, MA CCC/SLP Clinical Specialist - Acute Care SLP Acute Rehabilitation Services Office number [phone]

## 2024-03-18 NOTE — Progress Notes (Addendum)
 NAME:  Andrea Hoffman, MRN:  [id number], DOB:  Oct 31, 1946, LOS: 1 ADMISSION DATE:  03/17/2024, CONSULTATION DATE:  03/18/2024 REFERRING MD: ED MD, CHIEF COMPLAINT:  Hypotension    History of Present Illness:  78 year old female with past medical history of below, which is significant for atrial fibrillation on warfarin, congestive heart failure, hypertension, diabetes, colon cancer status postsurgery and chemotherapy remote, endometrial cancer, and rheumatic fever.  She was in her usual state of health until 4/13 when she developed fevers, chills, malaise, vomiting, and diarrhea.  Diarrhea and emesis did not appear bloody.  She reports shortness of breath on good days, but has been worse since this illness started.  She has nonproductive cough as well.  Symptoms persisted until 4/16 when she suffered a syncopal episode and presented to [facility] emergency department.  Upon arrival to the ED she was noted to be tachycardic with rates up to the 170s in atrial fibrillation and was also hypotensive with systolic blood pressures in the 80s to low 100s.  She was given empiric antibiotics and 2 L of IV fluid without significant improvement of her blood pressure or heart rate.  PCCM is asked to admit to ICU.  Pertinent  Medical History  History of Anemia, Broken arm, Bunion, Colon cancer (HCC), Cyanocobalamin deficiency, Depression, Diabetes mellitus without complication (HCC), Diabetic neuropathy (HCC), Dyspnea, Endometrial cancer (HCC), Hammer toe, High cholesterol, History of pneumonia, Hypertension, Migraines, Pulmonary nodules, and Rheumatic fever Significant Hospital Events: Including procedures, antibiotic start and stop dates in addition to other pertinent events   4/16: admitted to ICU 4/17: on Amiodarone drip, Neo synephrine 55 mcg/min, LR 100 cc/hr. Poor UOP, positive fluid balance 2.2 L since admission. On 6 L [place]. Thirsty. In sinus now  Interim History / Subjective:    Objective   Blood  pressure (!) 89/59, pulse 86, temperature 98.2 F (36.8 C), temperature source Axillary, resp. rate (!) 24, height 5\' 6"  (1.676 m), weight 87 kg, SpO2 95%.        Intake/Output Summary (Last 24 hours) at 03/18/2024 0951 Last data filed at 03/18/2024 0800 Gross per 24 hour  Intake 2493.56 ml  Output 0 ml  Net 2493.56 ml   Filed Weights   03/17/24 2000 03/18/24 0500  Weight: 96.6 kg 87 kg    Examination: General: alert, oriented x4, and comfortable. On 6 L [place]. SpO2 91%  HENT: PERL, normal pharynx and oral mucosa. No LNE or thyromegaly. No JVD Lungs: symmetrical air entry bilaterally. Right sided mild crackles and exp wheezing Cardiovascular: NL S1/S2. No m/g/r Abdomen: no distension or tenderness Extremities: trace edema. Symmetrical  Neuro: nonfocal    Resolved Hospital Problem list     Assessment & Plan:  Septic shock: CAP Lactic acidosis/AGMA Diarrhea resolved - Phenylephrine infusion for MAP >65 mmHg - Continue cefepime#2 -Add Zithromax for 3 days -d/c Flagyl and Vancomycin, MRSA screening is negative - f/u BCx2 - Check stool studies - f/u Urine strep and legionella - Echocardiogram - Serial lactic acid - LR bolus 1 L  - Start Bicarb drip - ST eval (choking with water) - I/O chart    Atrial fibrillation/RVR: known AF history. Anticoagulated with warfarin.  - Tele monitoring - Amiodarone bolus and infusion - Check troponin - She is OK with cardioversion if necessary - Keep K > 4 and mag > 2 - Monitor INR   AKI: shock, hypovolemia. Better Hypomagnesemia, hypoNa - Replace - Trend BMP   Hypothyroid - Resume home meds  Anemia  of chronic illness and Fe def - Start po Fe - Trend CBC   DM-2 (HbA1c 6.3%) - CBG monitoring and SSI  HTN - Hold home BP meds  Best Practice (right click and "Reselect all SmartList Selections" daily)   Diet/type: NPO w/ oral meds DVT prophylaxis Coumadin Pressure ulcer(s): N/A GI prophylaxis: H2B Lines: N/A Foley:   N/A Code Status:  DNR Last date of multidisciplinary goals of care discussion []   Labs   CBC: Recent Labs  Lab 03/17/24 2027 03/17/24 2045 03/18/24 0126 03/18/24 0718  WBC  --  25.2* 24.6*  --   NEUTROABS  --  22.7*  --   --   HGB 10.9*  10.5* 8.7* 8.3* 9.1*  HCT 32.0*  31.0* 29.8* 27.4* 29.4*  MCV  --  74.1* 72.9*  --   PLT  --  311 313  --     Basic Metabolic Panel: Recent Labs  Lab 03/17/24 2027 03/17/24 2045 03/18/24 0126  NA 123*  124* 125* 124*  K 4.2  4.2 4.2 4.0  CL 92* 90* 90*  CO2  --  18* 16*  GLUCOSE 192* 189* 188*  BUN 44* 50* 50*  CREATININE 2.20* 2.01* 1.93*  CALCIUM  --  9.2 8.6*  MG  --  1.6* 1.5*  PHOS  --   --  4.3   GFR: Estimated Creatinine Clearance: 27.1 mL/min (A) (by C-G formula based on SCr of 1.93 mg/dL (H)). Recent Labs  Lab 03/17/24 2028 03/17/24 2045 03/17/24 2232 03/18/24 0126 03/18/24 0127  WBC  --  25.2*  --  24.6*  --   LATICACIDVEN 2.3*  --  2.8*  --  2.3*    Liver Function Tests: Recent Labs  Lab 03/17/24 2045  AST 57*  ALT 39  ALKPHOS 100  BILITOT 0.8  PROT 7.4  ALBUMIN 3.0*   Recent Labs  Lab 03/17/24 2045  LIPASE 17   No results for input(s): "AMMONIA" in the last 168 hours.  ABG    Component Value Date/Time   HCO3 18.4 (L) 03/17/2024 2027   TCO2 17 (L) 03/17/2024 2027   TCO2 19 (L) 03/17/2024 2027   ACIDBASEDEF 6.0 (H) 03/17/2024 2027   O2SAT 80 03/17/2024 2027     Coagulation Profile: Recent Labs  Lab 03/17/24 2045 03/18/24 0718  INR 2.0* 2.6*    Cardiac Enzymes: No results for input(s): "CKTOTAL", "CKMB", "CKMBINDEX", "TROPONINI" in the last 168 hours.  HbA1C: Hgb A1c MFr Bld  Date/Time Value Ref Range Status  03/17/2024 11:27 PM 6.3 (H) 4.8 - 5.6 % Final    Comment:    (NOTE) Pre diabetes:          5.7%-6.4%  Diabetes:              >6.4%  Glycemic control for   <7.0% adults with diabetes   06/15/2018 12:33 PM 7.0 (H) 4.8 - 5.6 % Final    Comment:    (NOTE) Pre  diabetes:          5.7%-6.4% Diabetes:              >6.4% Glycemic control for   <7.0% adults with diabetes     CBG: Recent Labs  Lab 03/17/24 2105 03/18/24 0026 03/18/24 0356 03/18/24 0703  GLUCAP 177* 194* 221* 167*    Review of Systems:   Review of Systems  Constitutional:  Positive for malaise/fatigue. Negative for chills, diaphoresis and fever.  HENT:  Negative for congestion, sinus pain and  sore throat.   Respiratory:  Positive for cough, sputum production and shortness of breath. Negative for hemoptysis, wheezing and stridor.   Cardiovascular:  Negative for chest pain, palpitations, orthopnea, leg swelling and PND.  Gastrointestinal:  Positive for diarrhea. Negative for abdominal pain, blood in stool, constipation, heartburn, melena, nausea and vomiting.  Genitourinary:  Negative for dysuria, frequency and urgency.     Past Medical History:  She,  has a past medical history of Anemia, Broken arm, Bunion, Colon cancer (HCC), Cyanocobalamin deficiency, Depression, Diabetes mellitus without complication (HCC), Diabetic neuropathy (HCC), Dyspnea, Endometrial cancer (HCC), Hammer toe, High cholesterol, History of pneumonia, Hypertension, Migraines, Pulmonary nodules, and Rheumatic fever.   Surgical History:   Past Surgical History:  Procedure Laterality Date   AMPUTATION TOE  2014   2 toes amputated   COLONOSCOPY     LAPAROTOMY WITH STAGING N/A 07/01/2018   Procedure: POSSIBLE LAPAROTOMY WITH STAGING;  Surgeon: Lyn Sanders, MD;  Location: [facility];  Service: Gynecology;  Laterality: N/A;   PARTIAL COLECTOMY  2002 or 2003   Colon cancer [facility] - "took 18-22 inches"   ROBOTIC ASSISTED TOTAL HYSTERECTOMY WITH BILATERAL SALPINGO OOPHERECTOMY Bilateral 07/01/2018   Procedure: XI ROBOTIC ASSISTED TOTAL HYSTERECTOMY WITH BILATERAL SALPINGO OOPHORECTOMY;  Surgeon: Lyn Sanders, MD;  Location: [facility];  Service: Gynecology;  Laterality: Bilateral;   SENTINEL NODE  BIOPSY N/A 07/01/2018   Procedure: SENTINEL NODE BIOPSY;  Surgeon: Lyn Sanders, MD;  Location: [facility];  Service: Gynecology;  Laterality: N/A;   TONSILLECTOMY     Patient had her tonsils taken out at age 39   TUBAL LIGATION  1974   UPPER GI ENDOSCOPY       Social History:   reports that she has never smoked. She has never used smokeless tobacco. She reports that she does not drink alcohol and does not use drugs.   Family History:  Her family history includes Breast cancer in her cousin, maternal aunt, and maternal aunt; Breast cancer (age of onset: 44) in her mother; COPD in her maternal grandfather; Cancer in her cousin and cousin; Colon cancer in her maternal uncle and maternal uncle; Colon polyps in her brother; Diabetes in her brother and maternal grandmother; Heart attack in her father; Lung cancer in her mother; Other in her paternal grandmother.   Allergies Allergies  Allergen Reactions   Hydrocodone Other (See Comments)    Hallucinations     Home Medications  Prior to Admission medications   Medication Sig Start Date End Date Taking? Authorizing Provider  allopurinol (ZYLOPRIM) 100 MG tablet Take 100 mg by mouth daily.    [provider]  Alpha-Lipoic Acid 200 MG CAPS Take 200-400 mg by mouth See admin instructions. Take 200 mg by mouth in the morning and take 400 mg by mouth at bedtime    [provider]  amLODipine (NORVASC) 10 MG tablet Take 10 mg by mouth daily.  07/01/16   [provider]  carvedilol (COREG) 6.25 MG tablet Take 6.25 mg by mouth 2 (two) times daily with a meal.  07/25/16   [provider]  celecoxib (CELEBREX) 200 MG capsule Take 200 mg by mouth 2 (two) times daily. 08/01/21   [provider]  citalopram (CELEXA) 40 MG tablet Take 0.5 tablets (20 mg total) by mouth at bedtime. Take 20 mg once daily 07/02/18   Cross, Melissa D, NP  ELIQUIS 5 MG TABS tablet Take 5 mg by mouth 2 (two) times daily.  07/01/21   [provider]  ferrous sulfate 324 MG TBEC 324 mg. 07/01/21   [provider]  furosemide (LASIX) 20 MG tablet Take 20 mg by mouth daily. 11/08/21   [provider]  hydrochlorothiazide (HYDRODIURIL) 25 MG tablet Take 25 mg by mouth daily.  06/24/16   [provider]  levothyroxine (SYNTHROID) 50 MCG tablet Take 50 mcg by mouth daily. 02/17/24   [provider]  lisinopril (PRINIVIL,ZESTRIL) 20 MG tablet Take 20 mg by mouth daily.  06/24/16   [provider]  magnesium oxide (MAG-OX) 400 (240 Mg) MG tablet Take 1 tablet by mouth daily. 07/01/21   [provider]  metFORMIN (GLUCOPHAGE) 1000 MG tablet Take 1,000 mg by mouth 2 (two) times daily. 07/08/20   [provider]  oxyCODONE (OXY IR/ROXICODONE) 5 MG immediate release tablet Take 5 mg by mouth every 4 (four) hours as needed. 08/01/21   [provider]  potassium chloride (KLOR-CON) 10 MEQ tablet Take 10 mEq by mouth daily. 07/01/21   [provider]  simvastatin (ZOCOR) 20 MG tablet Take 20 mg by mouth at bedtime. 08/01/20   [provider]  vitamin B-12 (CYANOCOBALAMIN) 500 MCG tablet 1,000 mcg. 07/01/21   [provider]  warfarin (COUMADIN) 2 MG tablet Take 2 mg by mouth daily. 03/12/24   [provider]     Critical care time: 45 min     Arlyne Bering, MD [facility]

## 2024-03-18 NOTE — Progress Notes (Signed)
 eLink Physician-Brief Progress Note Patient Name: Andrea Hoffman DOB: 07/04/1946 MRN: [id number]   Date of Service  03/18/2024  HPI/Events of Note  78 year old female that presents with septic shock and atrial fibrillation with rapid ventricular response in the setting of heart failure with reduced ejection fraction and AKI.   Progressive hypoxemia today on 6 L of O2, desaturated to 83%.  Also oliguric for the past several days.  eICU Interventions  Advance to heated high flow nasal cannula  Obtain PA and lateral chest radiograph  Retention protocol, I/O cath as needed     Intervention Category Intermediate Interventions: Respiratory distress - evaluation and management  Ronalda Walpole 03/18/2024, 8:11 PM

## 2024-03-18 NOTE — H&P (View-Only) (Signed)
 eLink Physician-Brief Progress Note Patient Name: Andrea Hoffman DOB: 07/04/1946 MRN: [id number]   Date of Service  03/18/2024  HPI/Events of Note  78 year old female that presents with septic shock and atrial fibrillation with rapid ventricular response in the setting of heart failure with reduced ejection fraction and AKI.   Progressive hypoxemia today on 6 L of O2, desaturated to 83%.  Also oliguric for the past several days.  eICU Interventions  Advance to heated high flow nasal cannula  Obtain PA and lateral chest radiograph  Retention protocol, I/O cath as needed     Intervention Category Intermediate Interventions: Respiratory distress - evaluation and management  Ronalda Walpole 03/18/2024, 8:11 PM

## 2024-03-18 NOTE — Progress Notes (Signed)
 PHARMACY - ANTICOAGULATION CONSULT NOTE  Pharmacy Consult for Heparin when INR is <2 (warfarin on hold) Indication: atrial fibrillation  Allergies  Allergen Reactions   Hydrocodone Other (See Comments)    Hallucinations    Patient Measurements: Height: 5\' 6"  (167.6 cm) Weight: 87 kg (191 lb 12.8 oz) IBW/kg (Calculated) : 59.3  Vital Signs: Temp: 98.2 F (36.8 C) (04/17 0704) Temp Source: Axillary (04/17 0704) BP: 89/59 (04/17 0815) Pulse Rate: 86 (04/17 0815)  Labs: Recent Labs    03/17/24 2027 03/17/24 2045 03/18/24 0126 03/18/24 0718  HGB 10.9*  10.5* 8.7* 8.3* 9.1*  HCT 32.0*  31.0* 29.8* 27.4* 29.4*  PLT  --  311 313  --   LABPROT  --  23.0*  --  27.9*  INR  --  2.0*  --  2.6*  CREATININE 2.20* 2.01* 1.93*  --   TROPONINIHS  --   --  23*  --     Estimated Creatinine Clearance: 27.1 mL/min (A) (by C-G formula based on SCr of 1.93 mg/dL (H)).   Medical History: Past Medical History:  Diagnosis Date   Anemia    Broken arm    Right shoulder   Bunion    Colon cancer (HCC)    surgery followed by chemo for "6 months"   Cyanocobalamin deficiency    Depression    Diabetes mellitus without complication (HCC)    Diabetic neuropathy (HCC)    Dyspnea    with exertion   Endometrial cancer (HCC)    Hammer toe    High cholesterol    History of pneumonia    Hypertension    Migraines    history of   Pulmonary nodules    Numerous small   Rheumatic fever    age 31    Assessment: 78 y/o F on warfarin PTA for afib, here with septic shock, holding warfarin and using heparin during critical illness, INR is currently 2, will start heparin when INR is <2  INR up to 2.6. Cont to hold heparin today.   Goal of Therapy:  Heparin level 0.3-0.7 units/ml Monitor platelets by anticoagulation protocol: Yes   Plan:  Daily PT/INR Start heparin when INR is <2  Ivery Marking, PharmD, BCIDP, AAHIVP, CPP Infectious Disease Pharmacist 03/18/2024 8:50 AM

## 2024-03-18 NOTE — Progress Notes (Signed)
 Pharmacy Antibiotic Note  Andrea Hoffman is a 78 y.o. female admitted on 03/17/2024 with sepsis.  Pharmacy has been consulted for Vancomycin dosing. WBC is elevated. Noted renal dysfunction.   Plan: Vancomycin 1000 mg IV q48h >>>Estimated AUC: 461 Also Cefepime and Flagyl  Trend WBC, temp, renal function  F/U infectious work-up Drug levels as indicated   Height: 5\' 6"  (167.6 cm) Weight: 96.6 kg (212 lb 15.4 oz) IBW/kg (Calculated) : 59.3  Temp (24hrs), Avg:98 F (36.7 C), Min:97.5 F (36.4 C), Max:98.9 F (37.2 C)  Recent Labs  Lab 03/17/24 2027 03/17/24 2028 03/17/24 2045 03/17/24 2232  WBC  --   --  25.2*  --   CREATININE 2.20*  --  2.01*  --   LATICACIDVEN  --  2.3*  --  2.8*    Estimated Creatinine Clearance: 27.5 mL/min (A) (by C-G formula based on SCr of 2.01 mg/dL (H)).    Allergies  Allergen Reactions   Hydrocodone Other (See Comments)    Hallucinations    Silvestre Drum, PharmD, BCPS Clinical Pharmacist Phone: [phone]

## 2024-03-18 NOTE — Progress Notes (Addendum)
 eLink Physician-Brief Progress Note Patient Name: Andrea Hoffman DOB: 1946-04-02 MRN: [id number]   Date of Service  03/18/2024  HPI/Events of Note  78 year old female that presents with septic shock and atrial fibrillation with rapid ventricular response in the setting of heart failure with reduced ejection fraction and AKI.  On examination the patient is tachypneic, tachycardic, and mildly hypertensive.  Loaded on amiodarone with ongoing amiodarone.  Patient has elevated creatinine, hyponatremia, hypomagnesemia.  Leukocytosis, anemia status post transfusion.  Requesting antiemetics  eICU Interventions  Maintain vasopressors as needed for MAP goal greater than 65, maintain broad-spectrum antibiotics and assess blood cultures.  Maintain amiodarone infusion and correct electrolytes  GDMT held, resume once off pressors  QT prolongation noted, Compazine x 1  DVT prophylaxis with SCDs, consider initiating chemoprophylaxis. GI prophylaxis not indicated   0209 -has oliguria despite multiple fluid boluses and ongoing LR.  Continue monitoring for now.  No indication for Foley catheter.  Maintain retention guidelines.  0234 -magnesium supplementation  Intervention Category Evaluation Type: New Patient Evaluation  Andrea Hoffman 03/18/2024, 12:39 AM

## 2024-03-18 NOTE — Progress Notes (Signed)
 PHARMACY - ANTICOAGULATION CONSULT NOTE  Pharmacy Consult for Heparin when INR is <2 (warfarin on hold) Indication: atrial fibrillation  Allergies  Allergen Reactions   Hydrocodone Other (See Comments)    Hallucinations    Patient Measurements: Height: 5\' 6"  (167.6 cm) Weight: 96.6 kg (212 lb 15.4 oz) IBW/kg (Calculated) : 59.3  Vital Signs: Temp: 97.8 F (36.6 C) (04/16 2355) Temp Source: Oral (04/16 2355) BP: 88/67 (04/16 2355) Pulse Rate: 146 (04/16 2355)  Labs: Recent Labs    03/17/24 2027 03/17/24 2045  HGB 10.9*  10.5* 8.7*  HCT 32.0*  31.0* 29.8*  PLT  --  311  LABPROT  --  23.0*  INR  --  2.0*  CREATININE 2.20* 2.01*    Estimated Creatinine Clearance: 27.5 mL/min (A) (by C-G formula based on SCr of 2.01 mg/dL (H)).   Medical History: Past Medical History:  Diagnosis Date   Anemia    Broken arm    Right shoulder   Bunion    Colon cancer (HCC)    surgery followed by chemo for "6 months"   Cyanocobalamin deficiency    Depression    Diabetes mellitus without complication (HCC)    Diabetic neuropathy (HCC)    Dyspnea    with exertion   Endometrial cancer (HCC)    Hammer toe    High cholesterol    History of pneumonia    Hypertension    Migraines    history of   Pulmonary nodules    Numerous small   Rheumatic fever    age 37    Assessment: 78 y/o F on warfarin PTA for afib, here with septic shock, holding warfarin and using heparin during critical illness, INR is currently 2, will start heparin when INR is <2  Goal of Therapy:  Heparin level 0.3-0.7 units/ml Monitor platelets by anticoagulation protocol: Yes   Plan:  Daily PT/INR Start heparin when INR is <2  Silvestre Drum, PharmD, BCPS Clinical Pharmacist Phone: [phone]

## 2024-03-18 NOTE — TOC CM/SW Note (Signed)
 Transition of Care [facility]) - Inpatient Brief Assessment   Patient Details  Name: Andrea Hoffman MRN: [id number] Date of Birth: 04/26/1946  Transition of Care [facility]) CM/SW Contact:    Tom-Johnson, Angelique Ken, RN Phone Number: 03/18/2024, 12:55 PM   Clinical Narrative:  Patient presented to the ED with frequent Stools, Vomiting, Fever, Chills, and Syncopal episode. Found to be in A-Fib/RVR upon EMS arrival. Patient has hx of A-Fib and on Coumadin at home, CHF, Rheumatic Fever, HTN, Diabetes, Colon Cancer s/p Surgery and Chemotherapy and Endometrial Cancer. Admitted with Septic shock, on IV abx. Currently on 10L HFNC.   CM unable to assess, patient and family talking with medical team. CM will continue to follow and assess at the appropriate time.        Transition of Care Asessment: Insurance and Status: Insurance coverage has been reviewed Patient has primary care physician: Yes Home environment has been reviewed: Yes Prior level of function:: Modified Independent Prior/Current Home Services: No current home services Social Drivers of Health Review: SDOH reviewed no interventions necessary Readmission risk has been reviewed: Yes Transition of care needs: no transition of care needs at this time

## 2024-03-19 ENCOUNTER — Inpatient Hospital Stay (HOSPITAL_COMMUNITY)

## 2024-03-19 ENCOUNTER — Other Ambulatory Visit: Payer: Self-pay

## 2024-03-19 ENCOUNTER — Encounter (HOSPITAL_COMMUNITY): Admission: EM | Disposition: E | Payer: Self-pay | Source: Home / Self Care | Attending: Internal Medicine

## 2024-03-19 DIAGNOSIS — R627 Adult failure to thrive: Secondary | ICD-10-CM | POA: Diagnosis not present

## 2024-03-19 DIAGNOSIS — N183 Chronic kidney disease, stage 3 unspecified: Secondary | ICD-10-CM

## 2024-03-19 DIAGNOSIS — I4891 Unspecified atrial fibrillation: Secondary | ICD-10-CM | POA: Diagnosis not present

## 2024-03-19 DIAGNOSIS — I340 Nonrheumatic mitral (valve) insufficiency: Secondary | ICD-10-CM

## 2024-03-19 DIAGNOSIS — I5023 Acute on chronic systolic (congestive) heart failure: Secondary | ICD-10-CM

## 2024-03-19 DIAGNOSIS — R570 Cardiogenic shock: Secondary | ICD-10-CM

## 2024-03-19 DIAGNOSIS — R6521 Severe sepsis with septic shock: Secondary | ICD-10-CM | POA: Diagnosis not present

## 2024-03-19 DIAGNOSIS — A419 Sepsis, unspecified organism: Secondary | ICD-10-CM | POA: Diagnosis not present

## 2024-03-19 HISTORY — PX: RIGHT HEART CATH: CATH118263

## 2024-03-19 LAB — GLUCOSE, CAPILLARY
Glucose-Capillary: 150 mg/dL — ABNORMAL HIGH (ref 70–99)
Glucose-Capillary: 141 mg/dL — ABNORMAL HIGH (ref 70–99)
Glucose-Capillary: 156 mg/dL — ABNORMAL HIGH (ref 70–99)
Glucose-Capillary: 164 mg/dL — ABNORMAL HIGH (ref 70–99)
Glucose-Capillary: 244 mg/dL — ABNORMAL HIGH (ref 70–99)
Glucose-Capillary: 249 mg/dL — ABNORMAL HIGH (ref 70–99)

## 2024-03-19 LAB — POCT I-STAT EG7
Acid-base deficit: 3 mmol/L — ABNORMAL HIGH (ref 0.0–2.0)
Acid-base deficit: 2 mmol/L (ref 0.0–2.0)
Acid-base deficit: 3 mmol/L — ABNORMAL HIGH (ref 0.0–2.0)
Acid-base deficit: 5 mmol/L — ABNORMAL HIGH (ref 0.0–2.0)
Bicarbonate: 22.2 mmol/L (ref 20.0–28.0)
Bicarbonate: 23.1 mmol/L (ref 20.0–28.0)
Bicarbonate: 23.8 mmol/L (ref 20.0–28.0)
Bicarbonate: 22 mmol/L (ref 20.0–28.0)
Calcium, Ion: 1.13 mmol/L — ABNORMAL LOW (ref 1.15–1.40)
Calcium, Ion: 1.05 mmol/L — ABNORMAL LOW (ref 1.15–1.40)
Calcium, Ion: 1.09 mmol/L — ABNORMAL LOW (ref 1.15–1.40)
Calcium, Ion: 1.06 mmol/L — ABNORMAL LOW (ref 1.15–1.40)
HCT: 29 % — ABNORMAL LOW (ref 36.0–46.0)
HCT: 33 % — ABNORMAL LOW (ref 36.0–46.0)
HCT: 33 % — ABNORMAL LOW (ref 36.0–46.0)
HCT: 31 % — ABNORMAL LOW (ref 36.0–46.0)
Hemoglobin: 9.9 g/dL — ABNORMAL LOW (ref 12.0–15.0)
Hemoglobin: 11.2 g/dL — ABNORMAL LOW (ref 12.0–15.0)
Hemoglobin: 11.2 g/dL — ABNORMAL LOW (ref 12.0–15.0)
Hemoglobin: 10.5 g/dL — ABNORMAL LOW (ref 12.0–15.0)
O2 Saturation: 53 %
O2 Saturation: 39 %
O2 Saturation: 42 %
O2 Saturation: 68 %
Patient temperature: 98.1
Patient temperature: 35.2
Potassium: 3.4 mmol/L — ABNORMAL LOW (ref 3.5–5.1)
Potassium: 3.5 mmol/L (ref 3.5–5.1)
Potassium: 3.6 mmol/L (ref 3.5–5.1)
Potassium: 2.9 mmol/L — ABNORMAL LOW (ref 3.5–5.1)
Sodium: 124 mmol/L — ABNORMAL LOW (ref 135–145)
Sodium: 124 mmol/L — ABNORMAL LOW (ref 135–145)
Sodium: 125 mmol/L — ABNORMAL LOW (ref 135–145)
Sodium: 126 mmol/L — ABNORMAL LOW (ref 135–145)
TCO2: 23 mmol/L (ref 22–32)
TCO2: 24 mmol/L (ref 22–32)
TCO2: 25 mmol/L (ref 22–32)
TCO2: 23 mmol/L (ref 22–32)
pCO2, Ven: 40.4 mmHg — ABNORMAL LOW (ref 44–60)
pCO2, Ven: 45.3 mmHg (ref 44–60)
pCO2, Ven: 46.3 mmHg (ref 44–60)
pCO2, Ven: 43.2 mmHg — ABNORMAL LOW (ref 44–60)
pH, Ven: 7.346 (ref 7.25–7.43)
pH, Ven: 7.315 (ref 7.25–7.43)
pH, Ven: 7.318 (ref 7.25–7.43)
pH, Ven: 7.305 (ref 7.25–7.43)
pO2, Ven: 29 mmHg — CL (ref 32–45)
pO2, Ven: 25 mmHg — CL (ref 32–45)
pO2, Ven: 26 mmHg — CL (ref 32–45)
pO2, Ven: 35 mmHg (ref 32–45)

## 2024-03-19 LAB — COOXEMETRY PANEL
Carboxyhemoglobin: 1.6 % — ABNORMAL HIGH (ref 0.5–1.5)
Carboxyhemoglobin: 1.8 % — ABNORMAL HIGH (ref 0.5–1.5)
Methemoglobin: <0.7 % (ref 0.0–1.5)
Methemoglobin: 0.7 % (ref 0.0–1.5)
O2 Saturation: 64 %
O2 Saturation: 61.3 %
Total hemoglobin: 9.7 g/dL — ABNORMAL LOW (ref 12.0–16.0)
Total hemoglobin: 9.4 g/dL — ABNORMAL LOW (ref 12.0–16.0)

## 2024-03-19 LAB — C DIFFICILE QUICK SCREEN W PCR REFLEX
C Diff antigen: NEGATIVE
C Diff interpretation: NOT DETECTED
C Diff toxin: NEGATIVE

## 2024-03-19 LAB — COMPREHENSIVE METABOLIC PANEL WITH GFR
ALT: 889 U/L — ABNORMAL HIGH (ref 0–44)
AST: 1133 U/L — ABNORMAL HIGH (ref 15–41)
Albumin: 2.4 g/dL — ABNORMAL LOW (ref 3.5–5.0)
Alkaline Phosphatase: 120 U/L (ref 38–126)
Anion gap: 16 — ABNORMAL HIGH (ref 5–15)
BUN: 53 mg/dL — ABNORMAL HIGH (ref 8–23)
CO2: 20 mmol/L — ABNORMAL LOW (ref 22–32)
Calcium: 8.2 mg/dL — ABNORMAL LOW (ref 8.9–10.3)
Chloride: 89 mmol/L — ABNORMAL LOW (ref 98–111)
Creatinine, Ser: 2.34 mg/dL — ABNORMAL HIGH (ref 0.44–1.00)
GFR, Estimated: 21 mL/min — ABNORMAL LOW (ref >60)
Glucose, Bld: 135 mg/dL — ABNORMAL HIGH (ref 70–99)
Potassium: 3.6 mmol/L (ref 3.5–5.1)
Sodium: 125 mmol/L — ABNORMAL LOW (ref 135–145)
Total Bilirubin: 0.4 mg/dL (ref 0.0–1.2)
Total Protein: 6 g/dL — ABNORMAL LOW (ref 6.5–8.1)

## 2024-03-19 LAB — CBC
HCT: 33.2 % — ABNORMAL LOW (ref 36.0–46.0)
Hemoglobin: 10.4 g/dL — ABNORMAL LOW (ref 12.0–15.0)
MCH: 22.6 pg — ABNORMAL LOW (ref 26.0–34.0)
MCHC: 31.3 g/dL (ref 30.0–36.0)
MCV: 72 fL — ABNORMAL LOW (ref 80.0–100.0)
Platelets: 396 10*3/uL (ref 150–400)
RBC: 4.61 MIL/uL (ref 3.87–5.11)
RDW: 17.9 % — ABNORMAL HIGH (ref 11.5–15.5)
WBC: 27.1 10*3/uL — ABNORMAL HIGH (ref 4.0–10.5)
nRBC: 0.4 % — ABNORMAL HIGH (ref 0.0–0.2)

## 2024-03-19 LAB — SEDIMENTATION RATE: Sed Rate: 42 mm/h — ABNORMAL HIGH (ref 0–22)

## 2024-03-19 LAB — BASIC METABOLIC PANEL WITH GFR
Anion gap: 15 (ref 5–15)
BUN: 58 mg/dL — ABNORMAL HIGH (ref 8–23)
CO2: 22 mmol/L (ref 22–32)
Calcium: 7.8 mg/dL — ABNORMAL LOW (ref 8.9–10.3)
Chloride: 87 mmol/L — ABNORMAL LOW (ref 98–111)
Creatinine, Ser: 2.66 mg/dL — ABNORMAL HIGH (ref 0.44–1.00)
GFR, Estimated: 18 mL/min — ABNORMAL LOW (ref >60)
Glucose, Bld: 239 mg/dL — ABNORMAL HIGH (ref 70–99)
Potassium: 3.2 mmol/L — ABNORMAL LOW (ref 3.5–5.1)
Sodium: 124 mmol/L — ABNORMAL LOW (ref 135–145)

## 2024-03-19 LAB — MAGNESIUM
Magnesium: 3.2 mg/dL — ABNORMAL HIGH (ref 1.7–2.4)
Magnesium: 3 mg/dL — ABNORMAL HIGH (ref 1.7–2.4)

## 2024-03-19 LAB — LACTIC ACID, PLASMA: Lactic Acid, Venous: 1.5 mmol/L (ref 0.5–1.9)

## 2024-03-19 LAB — PROCALCITONIN: Procalcitonin: 7.74 ng/mL

## 2024-03-19 LAB — CALCIUM, IONIZED: Calcium, Ionized, Serum: 4.5 mg/dL (ref 4.5–5.6)

## 2024-03-19 LAB — PROTIME-INR
INR: 2.6 — ABNORMAL HIGH (ref 0.8–1.2)
Prothrombin Time: 27.8 s — ABNORMAL HIGH (ref 11.4–15.2)

## 2024-03-19 LAB — TSH: TSH: 2.056 u[IU]/mL (ref 0.350–4.500)

## 2024-03-19 LAB — CORTISOL: Cortisol, Plasma: 49 ug/dL

## 2024-03-19 LAB — OSMOLALITY: Osmolality: 282 mosm/kg (ref 275–295)

## 2024-03-19 SURGERY — RIGHT HEART CATH
Anesthesia: LOCAL

## 2024-03-19 MED ORDER — MILRINONE LACTATE IN DEXTROSE 20-5 MG/100ML-% IV SOLN
0.2500 ug/kg/min | INTRAVENOUS | Status: DC
  Administered 2024-03-19 – 2024-03-20 (×2): 0.25 ug/kg/min via INTRAVENOUS
  Filled 2024-03-19 (×2): qty 100

## 2024-03-19 MED ORDER — LIDOCAINE HCL (PF) 1 % IJ SOLN
INTRAMUSCULAR | Status: AC
  Filled 2024-03-19: qty 30

## 2024-03-19 MED ORDER — SODIUM CHLORIDE 0.9 % IV SOLN: INTRAVENOUS | Status: DC

## 2024-03-19 MED ORDER — CALCIUM GLUCONATE-NACL 2-0.675 GM/100ML-% IV SOLN
2.0000 g | Freq: Once | INTRAVENOUS | Status: AC
  Administered 2024-03-20: 2000 mg via INTRAVENOUS
  Filled 2024-03-19: qty 100

## 2024-03-19 MED ORDER — SODIUM CHLORIDE 0.9% IV SOLUTION: INTRAVENOUS | Status: DC

## 2024-03-19 MED ORDER — SODIUM CHLORIDE 0.9% FLUSH
10.0000 mL | Freq: Two times a day (BID) | INTRAVENOUS | Status: DC
Start: 2024-03-19 — End: 2024-03-20
  Administered 2024-03-19: 10 mL

## 2024-03-19 MED ORDER — POTASSIUM CHLORIDE 20 MEQ PO PACK
20.0000 meq | PACK | Freq: Once | ORAL | Status: AC
  Administered 2024-03-19: 20 meq via ORAL
  Filled 2024-03-19: qty 1

## 2024-03-19 MED ORDER — SODIUM CHLORIDE 0.9% IV SOLUTION: INTRAVENOUS | Status: DC | PRN

## 2024-03-19 MED ORDER — HYDRALAZINE HCL 20 MG/ML IJ SOLN: 10.0000 mg | INTRAMUSCULAR | Status: AC | PRN

## 2024-03-19 MED ORDER — PANTOPRAZOLE SODIUM 40 MG PO TBEC
40.0000 mg | DELAYED_RELEASE_TABLET | Freq: Every day | ORAL | Status: DC
Start: 2024-03-20 — End: 2024-03-20

## 2024-03-19 MED ORDER — LINEZOLID 600 MG/300ML IV SOLN
600.0000 mg | Freq: Two times a day (BID) | INTRAVENOUS | Status: DC
  Administered 2024-03-19 – 2024-03-20 (×3): 600 mg via INTRAVENOUS
  Filled 2024-03-19 (×4): qty 300

## 2024-03-19 MED ORDER — SODIUM CHLORIDE 0.9 % IV SOLN: 250.0000 mL | INTRAVENOUS | Status: DC | PRN

## 2024-03-19 MED ORDER — SODIUM CHLORIDE 0.9% FLUSH
3.0000 mL | Freq: Two times a day (BID) | INTRAVENOUS | Status: DC
  Administered 2024-03-19: 3 mL via INTRAVENOUS

## 2024-03-19 MED ORDER — HEPARIN (PORCINE) IN NACL 1000-0.9 UT/500ML-% IV SOLN
INTRAVENOUS | Status: DC | PRN
  Administered 2024-03-19: 500 mL

## 2024-03-19 MED ORDER — PANTOPRAZOLE SODIUM 40 MG IV SOLR: 40.0000 mg | INTRAVENOUS | Status: DC

## 2024-03-19 MED ORDER — ONDANSETRON HCL 4 MG/2ML IJ SOLN
4.0000 mg | Freq: Four times a day (QID) | INTRAMUSCULAR | Status: DC | PRN
  Administered 2024-03-19 – 2024-03-20 (×3): 4 mg via INTRAVENOUS
  Filled 2024-03-19 (×3): qty 2

## 2024-03-19 MED ORDER — NOREPINEPHRINE 4 MG/250ML-% IV SOLN
2.0000 ug/min | INTRAVENOUS | Status: DC
  Administered 2024-03-19: 2 ug/min via INTRAVENOUS
  Filled 2024-03-19: qty 250

## 2024-03-19 MED ORDER — HYDROMORPHONE HCL 1 MG/ML IJ SOLN: 0.5000 mg | Freq: Once | INTRAMUSCULAR | Status: DC

## 2024-03-19 MED ORDER — ACETAMINOPHEN 325 MG PO TABS
650.0000 mg | ORAL_TABLET | ORAL | Status: DC | PRN
  Administered 2024-03-19 – 2024-03-20 (×2): 650 mg via ORAL
  Filled 2024-03-19 (×2): qty 2

## 2024-03-19 MED ORDER — SODIUM CHLORIDE 0.9% FLUSH: 3.0000 mL | INTRAVENOUS | Status: DC | PRN

## 2024-03-19 MED ORDER — FUROSEMIDE 10 MG/ML IJ SOLN
120.0000 mg | Freq: Two times a day (BID) | INTRAVENOUS | Status: DC
  Administered 2024-03-19 – 2024-03-20 (×2): 120 mg via INTRAVENOUS
  Filled 2024-03-19: qty 10
  Filled 2024-03-19 (×2): qty 12
  Filled 2024-03-19: qty 10

## 2024-03-19 MED ORDER — POTASSIUM CHLORIDE 10 MEQ/50ML IV SOLN
10.0000 meq | INTRAVENOUS | Status: AC
  Administered 2024-03-19 – 2024-03-20 (×5): 10 meq via INTRAVENOUS
  Filled 2024-03-19: qty 50
  Filled 2024-03-19: qty 100
  Filled 2024-03-19 (×4): qty 50

## 2024-03-19 MED ORDER — PIPERACILLIN-TAZOBACTAM 3.375 G IVPB
3.3750 g | Freq: Three times a day (TID) | INTRAVENOUS | Status: DC
  Administered 2024-03-19 – 2024-03-20 (×3): 3.375 g via INTRAVENOUS
  Filled 2024-03-19 (×3): qty 50

## 2024-03-19 MED ORDER — SODIUM CHLORIDE 0.9% FLUSH: 10.0000 mL | INTRAVENOUS | Status: DC | PRN

## 2024-03-19 MED ORDER — LIDOCAINE HCL (PF) 1 % IJ SOLN
INTRAMUSCULAR | Status: DC | PRN
  Administered 2024-03-19: 2 mL

## 2024-03-19 MED ORDER — HYDROMORPHONE HCL 1 MG/ML IJ SOLN
0.5000 mg | INTRAMUSCULAR | Status: DC | PRN
  Administered 2024-03-19: 0.5 mg via INTRAVENOUS
  Filled 2024-03-19: qty 0.5

## 2024-03-19 MED ORDER — NOREPINEPHRINE 4 MG/250ML-% IV SOLN
0.0000 ug/min | INTRAVENOUS | Status: DC
  Filled 2024-03-19 (×3): qty 250

## 2024-03-19 MED ORDER — PROCHLORPERAZINE EDISYLATE 10 MG/2ML IJ SOLN
10.0000 mg | Freq: Once | INTRAMUSCULAR | Status: AC
  Administered 2024-03-19: 10 mg via INTRAVENOUS
  Filled 2024-03-19: qty 2

## 2024-03-19 SURGICAL SUPPLY — 7 items
CATH SWAN GANZ 7F STRAIGHT (CATHETERS) IMPLANT
KIT MICROPUNCTURE NIT STIFF (SHEATH) IMPLANT
PACK CARDIAC CATHETERIZATION (CUSTOM PROCEDURE TRAY) ×2 IMPLANT
SHEATH PINNACLE 7F 10CM (SHEATH) IMPLANT
SHEATH PROBE COVER 6X72 (BAG) IMPLANT
TRANSDUCER W/STOPCOCK (MISCELLANEOUS) IMPLANT
TUBING ART PRESS 72 MALE/FEM (TUBING) IMPLANT

## 2024-03-19 NOTE — Progress Notes (Signed)
 PHARMACY - ANTICOAGULATION CONSULT NOTE  Pharmacy Consult for Heparin  when INR is <2 (warfarin on hold) Indication: atrial fibrillation  Allergies  Allergen Reactions   Hydrocodone  Other (See Comments)    Hallucinations    Patient Measurements: Height: 5\' 6"  (167.6 cm) Weight: 93.3 kg (205 lb 11 oz) IBW/kg (Calculated) : 59.3  Vital Signs: Temp: 96.9 F (36.1 C) (04/18 0741) Temp Source: Axillary (04/18 0741) BP: 122/77 (04/18 1548) Pulse Rate: 0 (04/18 1553)  Labs: Recent Labs    03/17/24 2045 03/18/24 0126 03/18/24 0718 03/18/24 1005 03/18/24 1308 03/19/24 0438 03/19/24 0825 03/19/24 1335 03/19/24 1526  HGB 8.7* 8.3* 9.1*  --    < >  --  10.4* 9.9* 11.2*  11.2*  HCT 29.8* 27.4* 29.4*  --    < >  --  33.2* 29.0* 33.0*  33.0*  PLT 311 313  --   --   --   --  396  --   --   LABPROT 23.0*  --  27.9*  --   --  27.8*  --   --   --   INR 2.0*  --  2.6*  --   --  2.6*  --   --   --   CREATININE 2.01* 1.93*  --   --   --   --  2.34*  --   --   TROPONINIHS  --  23*  --  29*  --   --   --   --   --    < > = values in this interval not displayed.    Estimated Creatinine Clearance: 23.2 mL/min (A) (by C-G formula based on SCr of 2.34 mg/dL (H)).   Medical History: Past Medical History:  Diagnosis Date   Anemia    Broken arm    Right shoulder   Bunion    Colon cancer (HCC)    surgery followed by chemo for "6 months"   Cyanocobalamin deficiency    Depression    Diabetes mellitus without complication (HCC)    Diabetic neuropathy (HCC)    Dyspnea    with exertion   Endometrial cancer (HCC)    Hammer toe    High cholesterol    History of pneumonia    Hypertension    Migraines    history of   Pulmonary nodules    Numerous small   Rheumatic fever    age 78    Assessment: 78 y/o F on warfarin PTA for afib, here with septic shock, holding warfarin and using heparin  during critical illness, INR is currently 2, will start heparin  when INR is <2  Pt s/p RHC,  INR remains elevated so heparin  on hold. Ok to start 2h after radial band removed and INR subtherapeutic.  Goal of Therapy:  Heparin  level 0.3-0.7 units/ml Monitor platelets by anticoagulation protocol: Yes   Plan:  Daily PT/INR Start heparin  when INR is <2   Levin Reamer, PharmD, BCPS, [facility] Clinical Pharmacist [phone] Please check AMION for all [facility] Pharmacy numbers 03/19/2024

## 2024-03-19 NOTE — Progress Notes (Signed)
 Peripherally Inserted Central Catheter Placement  The IV Nurse has discussed with the patient and/or persons authorized to consent for the patient, the purpose of this procedure and the potential benefits and risks involved with this procedure.  The benefits include less needle sticks, lab draws from the catheter, and the patient may be discharged home with the catheter. Risks include, but not limited to, infection, bleeding, blood clot (thrombus formation), and puncture of an artery; nerve damage and irregular heartbeat and possibility to perform a PICC exchange if needed/ordered by physician.  Alternatives to this procedure were also discussed.  Bard Power PICC patient education guide, fact sheet on infection prevention and patient information card has been provided to patient /or left at bedside.    PICC Placement Documentation  PICC Triple Lumen 03/19/24 Right Brachial 36 cm 0 cm (Active)  Indication for Insertion or Continuance of Line Vasoactive infusions 03/19/24 1222  Exposed Catheter (cm) 0 cm 03/19/24 1222  Site Assessment Clean, Dry, Intact 03/19/24 1222  Lumen #1 Status Flushed;Saline locked;Blood return noted 03/19/24 1222  Lumen #2 Status Flushed;Saline locked;Blood return noted 03/19/24 1222  Lumen #3 Status Flushed;Saline locked;Blood return noted 03/19/24 1222  Dressing Type Transparent;Securing device;Other (Comment) 03/19/24 1222  Dressing Status Antimicrobial disc/dressing in place;Clean, Dry, Intact 03/19/24 1222  Line Care Connections checked and tightened 03/19/24 1222  Line Adjustment (NICU/IV Team Only) No 03/19/24 1222  Dressing Intervention New dressing;Adhesive placed at insertion site (IV team only) 03/19/24 1222  Dressing Change Due 03/26/24 03/19/24 1222    Husband signed consent at bedside   Unk Garb 03/19/2024, 12:31 PM

## 2024-03-19 NOTE — Plan of Care (Signed)
  Problem: Skin Integrity: Goal: Risk for impaired skin integrity will decrease Outcome: Progressing   Problem: Pain Managment: Goal: General experience of comfort will improve and/or be controlled Outcome: Progressing   Problem: Safety: Goal: Ability to remain free from injury will improve Outcome: Progressing

## 2024-03-19 NOTE — Progress Notes (Addendum)
 eLink Physician-Brief Progress Note Patient Name: Andrea Hoffman DOB: 06/09/1946 MRN: [id number]   Date of Service  03/19/2024  HPI/Events of Note  Patient presented with heart failure.  She is currently nauseous and received Zofran  with minimal effect.  Also having generalized pain which has been poorly responsive to Dilaudid  0.5 mg  eICU Interventions  Additional dose of Dilaudid .  Dose of Compazine  ordered.  If pain persists, may need to increase as needed Dilaudid  dosing   2326 -VBG with no evidence of hypercapnia.  Some electrolyte disturbances, replete electrolytes  Intervention Category Minor Interventions: Routine modifications to care plan (e.g. PRN medications for pain, fever)  Brandilynn Taormina 03/19/2024, 8:15 PM

## 2024-03-19 NOTE — Progress Notes (Signed)
 NAME:  Andrea Hoffman, MRN:  [id number], DOB:  1946/01/27, LOS: 2 ADMISSION DATE:  03/17/2024, CONSULTATION DATE:  03/18/2024 REFERRING MD: ED MD, CHIEF COMPLAINT:  Hypotension    History of Present Illness:  78 year old female with past medical history of below, which is significant for atrial fibrillation on warfarin, congestive heart failure, hypertension, diabetes, colon cancer status postsurgery and chemotherapy remote, endometrial cancer, and rheumatic fever.  She was in her usual state of health until 4/13 when she developed fevers, chills, malaise, vomiting, and diarrhea.  Diarrhea and emesis did not appear bloody.  She reports shortness of breath on good days, but has been worse since this illness started.  She has nonproductive cough as well.  Symptoms persisted until 4/16 when she suffered a syncopal episode and presented to [facility] emergency department.  Upon arrival to the ED she was noted to be tachycardic with rates up to the 170s in atrial fibrillation and was also hypotensive with systolic blood pressures in the 80s to low 100s.  She was given empiric antibiotics and 2 L of IV fluid without significant improvement of her blood pressure or heart rate.  PCCM is asked to admit to ICU.   Pertinent  Medical History  History of Anemia, Broken arm, Bunion, Colon cancer (HCC), Cyanocobalamin deficiency, Depression, Diabetes mellitus without complication (HCC), Diabetic neuropathy (HCC), Dyspnea, Endometrial cancer (HCC), Hammer toe, High cholesterol, History of pneumonia, Hypertension, Migraines, Pulmonary nodules, and Rheumatic fever  Significant Hospital Events: Including procedures, antibiotic start and stop dates in addition to other pertinent events   4/16: admitted to ICU 4/17: on Amiodarone  drip, Neo synephrine 55 mcg/min, LR 100 cc/hr. Poor UOP, positive fluid balance 2.2 L since admission. On 6 L [place]. Thirsty. In NSR 4/18 remains medically stable on low-dose vasopressors, has  produced no urine since admission  Interim History / Subjective:  Lethargic but arouses easily and follows all commands  Objective   Blood pressure 92/70, pulse 73, temperature 98.1 F (36.7 C), temperature source Oral, resp. rate 19, height 5\' 6"  (1.676 m), weight 93.3 kg, SpO2 96%.        Intake/Output Summary (Last 24 hours) at 03/19/2024 2536 Last data filed at 03/19/2024 0601 Gross per 24 hour  Intake 5231.51 ml  Output 0 ml  Net 5231.51 ml   Filed Weights   03/17/24 2000 03/18/24 0500 03/19/24 0500  Weight: 96.6 kg 87 kg 93.3 kg    Examination: General: Acute on chronic ill-appearing deconditioned elderly female lying in bed in no acute distress HEENT: [place]/AT, MM pink/moist, PERRL,  Neuro: Sleepy but easily arousable, can state name and date of birth, weak CV: s1s2 regular rate and rhythm, no murmur, rubs, or gallops,  PULM: Clear to auscultation bilaterally, no increased work of breathing, no added breath sounds GI: soft, bowel sounds active in all 4 quadrants, non-tender, non-distended Extremities: warm/dry, no edema  Skin: no rashes or lesions  Resolved Hospital Problem list   Hypokalemia  Assessment & Plan:  Septic shock felt secondary to community-acquired pneumonia Lactic acidosis/AGMA P: Continue to follow cultures Continue cefepime  and azithromycin  Pressors for MAP goal greater than 65 Repeat lactic acid Check procalcitonin Monitor urine output Assess for stool output, collect sample if produced Remains on Bicarb drip as below    Chronic Atrial fibrillation/RVR -Known AF history. Anticoagulated with Eliquis .  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  Basleine creatinine ranges between 1.2-1.3. creatinine on admission 2.01 with GFR 25 Hypomagnesemia P: Follow renal function  Monitor urine output Trend Bmet Avoid nephrotoxins Ensure  adequate renal perfusion    Hypothyroid P: Continue home Synthroid   Anemia of chronic illness and Fe def P: Trend CBC Transfuse per protocol Hemoglobin goal greater than 7 Iron supplementation   DM-2 (HbA1c 6.3%) P: Continue moderate scale SSI CBG goal 140-180 CBG checks every 4  HTN P: Home antihypertensives on hold  Best Practice (right click and "Reselect all SmartList Selections" daily)   Diet/type: NPO w/ oral meds DVT prophylaxis Coumadin Pressure ulcer(s): N/A GI prophylaxis: H2B Lines: N/A Foley:  N/A Code Status:  DNR Last date of multidisciplinary goals of care discussion []   Critical care time:  CRITICAL CARE Performed by: Airen Dales D. Harris   Total critical care time: 40 minutes  Critical care time was exclusive of separately billable procedures and treating other patients.  Critical care was necessary to treat or prevent imminent or life-threatening deterioration.  Critical care was time spent personally by me on the following activities: development of treatment plan with patient and/or surrogate as well as nursing, discussions with consultants, evaluation of patient's response to treatment, examination of patient, obtaining history from patient or surrogate, ordering and performing treatments and interventions, ordering and review of laboratory studies, ordering and review of radiographic studies, pulse oximetry and re-evaluation of patient's condition.  Mellina Benison D. Harris, NP-C [facility] Personal contact information can be found on Amion  If no contact or response made please call [phone] 03/19/2024, 7:02 AM

## 2024-03-19 NOTE — Progress Notes (Signed)
 Speech Language Pathology Treatment: Dysphagia  Patient Details Name: Andrea Hoffman MRN: [id number] DOB: Nov 29, 1946 Today's Date: 03/19/2024 Time:  -1002    Assessment / Plan / Recommendation Clinical Impression  Andrea Hoffman was alert, tired, willing to participate.  Per RN, appears to be tolerating clear liquid diet without concerns. There were no overt s/s of aspiration during our session - she drank sequential sips of water  without incident.  Oral control and attention are WNL.  Recommend diet be advanced to solids when deemed ready per medical team.  No further SLP is needed. Our service will sign off.   HPI HPI: 78 year old female presented to ED with chills/fever, vomiting, diarrhea, tachycardia. Dx with septic shock, afib with RVR, AKI.  PMHx afib on warfarin, congestive heart failure, hypertension, diabetes, colon cancer status postsurgery and chemotherapy remote, endometrial cancer, and rheumatic fever.      SLP Plan  All goals met      Recommendations for follow up therapy are one component of a multi-disciplinary discharge planning process, led by the attending physician.  Recommendations may be updated based on patient status, additional functional criteria and insurance authorization.    Recommendations  Diet recommendations: Other(comment) (advance per medical team) Liquids provided via: Cup;Straw Medication Administration: Whole meds with liquid Supervision: Patient able to self feed Compensations: Minimize environmental distractions                  Oral care BID     Dysphagia, unspecified (R13.10)     All goals met   Andrea Hoffman L. Andrea Lincoln, MA CCC/SLP Clinical Specialist - Acute Care SLP Acute Rehabilitation Services Office number [phone]  Andrea Hoffman Andrea Hoffman  03/19/2024, 10:05 AM

## 2024-03-19 NOTE — Progress Notes (Signed)
 Afternoon rounds  Throughout the morning patient's shock state continues to progress with evidence of recurrent AKI and shock liver on a.m. labs.  Concern for low flow state given decreased EF on echo overnight.  PICC line placed to assess COOX and CVP.  Case was then discussed with heart failure team and decision was made to proceed with right heart cath.   Family updated at bedside.  Will transfer to cardiac ICU after cardiac cath.   Jelena Malicoat D. Harris, NP-C [facility] Personal contact information can be found on Amion  If no contact or response made please call [phone] 03/19/2024, 3:40 PM

## 2024-03-19 NOTE — Interval H&P Note (Signed)
 History and Physical Interval Note:  03/19/2024 3:12 PM  Andrea Hoffman  has presented today for surgery, with the diagnosis of heart failure.  The various methods of treatment have been discussed with the patient and family. After consideration of risks, benefits and other options for treatment, the patient has consented to  Procedure(s): RIGHT HEART CATH (N/A) as a surgical intervention.  The patient's history has been reviewed, patient examined, no change in status, stable for surgery.  I have reviewed the patient's chart and labs.  Questions were answered to the patient's satisfaction.     Valoree Agent

## 2024-03-19 NOTE — Consult Note (Addendum)
 Advanced Heart Failure Team Consult Note   Primary Physician: Sabas Norleen PARAS., MD Cardiologist:  [facility], Dr. Toribio  Reason for Consultation: Cardiogenic Shock  HPI:    Andrea Hoffman is seen today for evaluation of Cardiogenic shock at the request of Dr. Claudene.   Andrea Hoffman is a 78 y.o. female with history of artial fibrillation (on warfarin), HFrEF (although no documented EF <55%), HTN. T2DM, colon cancer s/p surgery and remote chemo, endometrial cancer s/p radiation and rheumatic fever (age 37).   She previously followed with [facility]. However last documented appointment was in 2018. Last echo in 2022 showed EF 55-60%, mod MR and mild stenosis.   Presented to [facility] ED 4/16 with weakness/presyncope after diarrheal illness. Reports that she has had a slow deterioration in functionality since her foot surgery 16yrs ago, she lives with her husband who cares for her. On arrival by EMS, was noted to be in AF RVR with rates up to 170s with hypotension. Labs notable for Na 125, K 4.2, Co2 18, BUN/Cr 50/2.01, Mg 1.6, BNP 655, WBC 25, hgb 8.7, LA 2.3. RVP negative. EKG showed AF RVR 135 bpm. She was given empiric antibiotics and 2L IVF. Admitted to the ICU for presumed septic shock with further management and started on Neo and amio. Procal 7.71, infectious workup negative thus far. She also has declined further form a renal standpoint and is now oliguric with BUN/Cr  53/2.34. Advanced Heart Failure consulted for concern of cardiogenic shock.   Echo EF 30-35% with gHK, G2DD, mildly reduced RV, loose anterior chorea with severe MR and mild stenosis.   Home Medications Prior to Admission medications   Medication Sig Start Date End Date Taking? Authorizing Provider  allopurinol  (ZYLOPRIM ) 100 MG tablet Take 100 mg by mouth daily.    [provider]  Alpha-Lipoic Acid 200 MG CAPS Take 200-400 mg by mouth See admin instructions. Take 200 mg by mouth in the morning and take  400 mg by mouth at bedtime    [provider]  amLODipine  (NORVASC ) 10 MG tablet Take 10 mg by mouth daily.  07/01/16   [provider]  carvedilol  (COREG ) 6.25 MG tablet Take 6.25 mg by mouth 2 (two) times daily with a meal.  07/25/16   [provider]  celecoxib (CELEBREX) 200 MG capsule Take 200 mg by mouth 2 (two) times daily. 08/01/21   [provider]  citalopram  (CELEXA ) 40 MG tablet Take 0.5 tablets (20 mg total) by mouth at bedtime. Take 20 mg once daily 07/02/18   Cross, Melissa D, NP  ELIQUIS 5 MG TABS tablet Take 5 mg by mouth 2 (two) times daily. 07/01/21   [provider]  ferrous sulfate  324 MG TBEC 324 mg. 07/01/21   [provider]  furosemide  (LASIX ) 20 MG tablet Take 20 mg by mouth daily. 11/08/21   [provider]  hydrochlorothiazide  (HYDRODIURIL ) 25 MG tablet Take 25 mg by mouth daily.  06/24/16   [provider]  levothyroxine  (SYNTHROID ) 50 MCG tablet Take 50 mcg by mouth daily. 02/17/24   [provider]  lisinopril  (PRINIVIL ,ZESTRIL ) 20 MG tablet Take 20 mg by mouth daily.  06/24/16   [provider]  magnesium  oxide (MAG-OX) 400 (240 Mg) MG tablet Take 1 tablet by mouth daily. 07/01/21   [provider]  metFORMIN  (GLUCOPHAGE ) 1000 MG tablet Take 1,000 mg by mouth 2 (two) times daily. 07/08/20   [provider]  oxyCODONE  (OXY  IR/ROXICODONE ) 5 MG immediate release tablet Take 5 mg by mouth every 4 (four) hours as needed. 08/01/21   [provider]  potassium chloride  (KLOR-CON ) 10 MEQ tablet Take 10 mEq by mouth daily. 07/01/21   [provider]  simvastatin  (ZOCOR ) 20 MG tablet Take 20 mg by mouth at bedtime. 08/01/20   [provider]  vitamin B-12 (CYANOCOBALAMIN) 500 MCG tablet 1,000 mcg. 07/01/21   [provider]  warfarin (COUMADIN) 2 MG tablet Take 2 mg by mouth daily. 03/12/24   [provider]    Past Medical History: Past  Medical History:  Diagnosis Date   Anemia    Broken arm    Right shoulder   Bunion    Colon cancer (HCC)    surgery followed by chemo for 6 months   Cyanocobalamin deficiency    Depression    Diabetes mellitus without complication (HCC)    Diabetic neuropathy (HCC)    Dyspnea    with exertion   Endometrial cancer (HCC)    Hammer toe    High cholesterol    History of pneumonia    Hypertension    Migraines    history of   Pulmonary nodules    Numerous small   Rheumatic fever    age 16    Past Surgical History: Past Surgical History:  Procedure Laterality Date   AMPUTATION TOE  2014   2 toes amputated   COLONOSCOPY     LAPAROTOMY WITH STAGING N/A 07/01/2018   Procedure: POSSIBLE LAPAROTOMY WITH STAGING;  Surgeon: Anitra Freddy NOVAK, MD;  Location: [facility];  Service: Gynecology;  Laterality: N/A;   PARTIAL COLECTOMY  2002 or 2003   Colon cancer [facility] - took 18-22 inches   ROBOTIC ASSISTED TOTAL HYSTERECTOMY WITH BILATERAL SALPINGO OOPHERECTOMY Bilateral 07/01/2018   Procedure: XI ROBOTIC ASSISTED TOTAL HYSTERECTOMY WITH BILATERAL SALPINGO OOPHORECTOMY;  Surgeon: Anitra Freddy NOVAK, MD;  Location: [facility];  Service: Gynecology;  Laterality: Bilateral;   SENTINEL NODE BIOPSY N/A 07/01/2018   Procedure: SENTINEL NODE BIOPSY;  Surgeon: Anitra Freddy NOVAK, MD;  Location: [facility];  Service: Gynecology;  Laterality: N/A;   TONSILLECTOMY     Patient had her tonsils taken out at age 50   TUBAL LIGATION  1974   UPPER GI ENDOSCOPY      Family History: Family History  Problem Relation Age of Onset   Heart attack Father        d. 51   Diabetes Brother    Colon polyps Brother    Diabetes Maternal Grandmother        d. 5   Lung cancer Mother    Breast cancer Mother 32       ? bony mets; d. 56   Breast cancer Maternal Aunt        dx over 76   Colon cancer Maternal Uncle        dx over 47   Breast cancer Maternal Aunt        dx over 10   Cancer Cousin        mat first  cousin; unknown type in their 66's   Cancer Cousin        mat first cousin; Lung in their 2's   Colon cancer Maternal Uncle        dx over 25   COPD Maternal Grandfather    Other Paternal Grandmother        childbirth bed fever, d. 89   Breast  cancer Cousin        pat first cousin d. 2s    Social History: Social History   Socioeconomic History   Marital status: Married    Spouse name: Not on file   Number of children: Not on file   Years of education: Not on file   Highest education level: Not on file  Occupational History   Not on file  Tobacco Use   Smoking status: Never   Smokeless tobacco: Never  Vaping Use   Vaping status: Never Used  Substance and Sexual Activity   Alcohol  use: Never   Drug use: Never   Sexual activity: Not Currently    Birth control/protection: Post-menopausal  Other Topics Concern   Not on file  Social History Narrative   Not on file   Social Drivers of Health   Financial Resource Strain: Not on file  Food Insecurity: Not on file  Transportation Needs: Not on file  Physical Activity: Not on file  Stress: Not on file  Social Connections: Not on file    Allergies:  Allergies  Allergen Reactions   Hydrocodone  Other (See Comments)    Hallucinations    Objective:    Vital Signs:   Temp:  [96.9 F (36.1 C)-98.5 F (36.9 C)] 96.9 F (36.1 C) (04/18 0741) Pulse Rate:  [73-112] 106 (04/18 1415) Resp:  [18-35] 29 (04/18 1415) BP: (78-145)/(43-103) 99/43 (04/18 1415) SpO2:  [45 %-100 %] 93 % (04/18 1415) Weight:  [93.3 kg] 93.3 kg (04/18 0500) Last BM Date : 03/18/24  Weight change: Filed Weights   03/17/24 2000 03/18/24 0500 03/19/24 0500  Weight: 96.6 kg 87 kg 93.3 kg   Intake/Output:  Intake/Output Summary (Last 24 hours) at 03/19/2024 1457 Last data filed at 03/19/2024 1200 Gross per 24 hour  Intake 3936.12 ml  Output 200 ml  Net 3736.12 ml    Physical Exam    CVP 10 General: FTT, haggard appearing. No distress on  [place] Cardiac: S1 and S2 present. Apex systolic murmur Extremities: Warm and dry.  1+ BLE edema.  Neuro: Alert and oriented x3. Generalized weakness  Telemetry   AF in 90s (personally reviewed)  EKG    Admission EKG as above.  Labs   Basic Metabolic Panel: Recent Labs  Lab 03/17/24 2027 03/17/24 2045 03/18/24 0126 03/18/24 1308 03/19/24 0825 03/19/24 1335  NA 123*  124* 125* 124* 123* 125* 124*  K 4.2  4.2 4.2 4.0 4.1 3.6 3.4*  CL 92* 90* 90*  --  89*  --   CO2  --  18* 16*  --  20*  --   GLUCOSE 192* 189* 188*  --  135*  --   BUN 44* 50* 50*  --  53*  --   CREATININE 2.20* 2.01* 1.93*  --  2.34*  --   CALCIUM   --  9.2 8.6*  --  8.2*  --   MG  --  1.6* 1.5*  --  3.2*  --   PHOS  --   --  4.3  --   --   --     Liver Function Tests: Recent Labs  Lab 03/17/24 2045 03/19/24 0825  AST 57* 1,133*  ALT 39 889*  ALKPHOS 100 120  BILITOT 0.8 0.4  PROT 7.4 6.0*  ALBUMIN 3.0* 2.4*   Recent Labs  Lab 03/17/24 2045  LIPASE 17   No results for input(s): AMMONIA in the last 168 hours.  CBC: Recent Labs  Lab 03/17/24  2044/11/15 03/18/24 0126 03/18/24 0718 03/18/24 1308 03/19/24 0825 03/19/24 1335  WBC 25.2* 24.6*  --   --  27.1*  --   NEUTROABS 22.7*  --   --   --   --   --   HGB 8.7* 8.3* 9.1* 9.2* 10.4* 9.9*  HCT 29.8* 27.4* 29.4* 27.0* 33.2* 29.0*  MCV 74.1* 72.9*  --   --  72.0*  --   PLT 311 313  --   --  396  --    BNP: BNP (last 3 results) Recent Labs    03/17/24 11-15-2044  BNP 655.9*   CBG: Recent Labs  Lab 03/18/24 2021 03/18/24 2336 03/19/24 0406 03/19/24 0738 03/19/24 1312  GLUCAP 123* 119* 150* 141* 156*   Coagulation Studies: Recent Labs    03/17/24 November 15, 2044 03/18/24 0718 03/19/24 0438  LABPROT 23.0* 27.9* 27.8*  INR 2.0* 2.6* 2.6*    Imaging   DG Chest Port 1 View Result Date: 03/19/2024 CLINICAL DATA:  [id number] S/P PICC central line placement [id number]. EXAM: PORTABLE CHEST 1 VIEW COMPARISON:  03/18/2024. FINDINGS: Low lung volume.  There is increasing heterogeneous opacity overlying the right mid lower lung zones. Bilateral lung fields are otherwise clear. There is apparent blunting of bilateral lateral costophrenic angles, which may be due to overlying soft tissue versus trace pleural effusions. Stable cardio-mediastinal silhouette. Note is made of dense mitral annulus calcification. No acute osseous abnormalities. The soft tissues are within normal limits. Right-sided PICC line seen with its tip overlying the lower portion of superior vena cava. IMPRESSION: 1. Interval placement of right-sided PICC line with its tip overlying the lower portion of superior vena cava. No pneumothorax. 2. Increasing heterogeneous opacity overlying the right mid lower lung zones, which may represent atelectasis and/or pneumonia. 3. Blunting of bilateral lateral costophrenic angles, which may be due to overlying soft tissue versus trace pleural effusions. Electronically Signed   By: Ree Molt M.D.   On: 03/19/2024 13:00   US  EKG SITE RITE Result Date: 03/19/2024 If Site Rite image not attached, placement could not be confirmed due to current cardiac rhythm.  DG Chest 2 View Result Date: 03/18/2024 CLINICAL DATA:  Hypoxia EXAM: CHEST - 2 VIEW COMPARISON:  03/17/2024 FINDINGS: Focal opacity within the right mid lung zone is again noted in keeping with focal atelectasis or infiltrate. Small right pleural effusion noted. No pneumothorax. Cardiac size within normal limits. Pulmonary vascularity is normal. No acute bone abnormality. IMPRESSION: 1. Stable focal opacity within the right mid lung zone in keeping with focal atelectasis or infiltrate. 2. Small right pleural effusion. Electronically Signed   By: Dorethia Molt M.D.   On: 03/18/2024 23:12   CT ABDOMEN PELVIS WO CONTRAST Result Date: 03/18/2024 CLINICAL DATA:  Septic shock and diarrhea. EXAM: CT ABDOMEN AND PELVIS WITHOUT CONTRAST TECHNIQUE: Multidetector CT imaging of the abdomen and pelvis was  performed following the standard protocol without IV contrast. RADIATION DOSE REDUCTION: This exam was performed according to the departmental dose-optimization program which includes automated exposure control, adjustment of the mA and/or kV according to patient size and/or use of iterative reconstruction technique. COMPARISON:  CT chest abdomen and pelvis 06/23/2021. FINDINGS: Lower chest: There is a stable 3 mm nodule in the left lower lobe. There is a small to moderate-sized right pleural effusion with atelectasis in the right lower lobe. Hepatobiliary: Gallstones are present. There is gallbladder wall edema. No blurring ductal dilatation identified. The liver is mildly enlarged. Pancreas: Unremarkable. No pancreatic ductal dilatation or  surrounding inflammatory changes. Spleen: Normal in size without focal abnormality. Adrenals/Urinary Tract: The bladder is not well evaluated secondary to streak artifact in the pelvis. There is no hydronephrosis or renal calculus identified. The adrenal glands are within normal limits. Stomach/Bowel: There is been partial right colectomy. There is no bowel obstruction, wall thickening or inflammation. There is no pneumatosis or free air. The stomach is within normal limits. Vascular/Lymphatic: Aortic atherosclerosis. No enlarged abdominal or pelvic lymph nodes. Reproductive: Status post hysterectomy. No adnexal masses. Other: There is small volume ascites. There is mild body wall edema. Wide-mouth umbilical hernia is present containing nondilated bowel and mesenteric fat. Musculoskeletal: Right hip arthroplasty is present. IMPRESSION: 1. Cholelithiasis with gallbladder wall edema. Findings are nonspecific and may be related to volume overload. Correlate clinically for acute cholecystitis. 2. Small to moderate-sized right pleural effusion with atelectasis in the right lower lobe. 3. Small volume ascites. 4. Mild body wall edema. 5. Wide-mouth umbilical hernia containing  nondilated bowel and mesenteric fat. Aortic Atherosclerosis (ICD10-I70.0). Electronically Signed   By: Greig Pique M.D.   On: 03/18/2024 23:04   ECHOCARDIOGRAM COMPLETE Result Date: 03/18/2024    ECHOCARDIOGRAM REPORT   Patient Name:   Andrea Hoffman Date of Exam: 03/18/2024 Medical Rec #:  [id number]       Height:       66.0 in Accession #:    [id number]      Weight:       191.8 lb Date of Birth:  [birth year]       BSA:          1.965 m Patient Age:    77 years        BP:           89/56 mmHg Patient Gender: F               HR:           105 bpm. Exam Location:  Inpatient Procedure: 2D Echo, Cardiac Doppler, Color Doppler and Intracardiac            Opacification Agent (Both Spectral and Color Flow Doppler were            utilized during procedure). Indications:     Cardiac shock.  History:         Patient has no prior history of Echocardiogram examinations.                  Abnormal ECG, Arrythmias:Atrial Fibrillation; Risk                  Factors:Diabetes and Dyslipidemia. Cancer.  Sonographer:     Ellouise Mose RDCS Referring Phys:  [id number] ORN [facility] Diagnosing Phys: Salena Negri MD  Sonographer Comments: Technically difficult study due to poor echo windows and patient is obese. Image acquisition challenging due to patient body habitus. IMPRESSIONS  1. Left ventricular ejection fraction, by estimation, is 30 to 35%. The left ventricle has moderately decreased function. The left ventricle demonstrates global hypokinesis. There is mild asymmetric left ventricular hypertrophy of the inferior segment. Left ventricular diastolic parameters are consistent with Grade II diastolic dysfunction (pseudonormalization).  2. Right ventricular systolic function is mildly reduced. The right ventricular size is mildly enlarged. There is normal pulmonary artery systolic pressure.  3. Left atrial size was mildly dilated.  4. Right atrial size was mildly dilated.  5. Loose anterior leaflet chordae.. The mitral valve is  degenerative. Severe mitral valve regurgitation. Mild mitral stenosis. Moderate  to severe mitral annular calcification.  6. The aortic valve is tricuspid. There is mild calcification of the aortic valve. There is mild thickening of the aortic valve. Aortic valve regurgitation is not visualized. Aortic valve sclerosis/calcification is present, without any evidence of aortic stenosis.  7. There is mild (Grade II) atheroma plaque involving the aortic root and ascending aorta.  8. The inferior vena cava is normal in size with <50% respiratory variability, suggesting right atrial pressure of 8 mmHg. Conclusion(s)/Recommendation(s): Findings consistent with ischemic cardiomyopathy. FINDINGS  Left Ventricle: Left ventricular ejection fraction, by estimation, is 30 to 35%. The left ventricle has moderately decreased function. The left ventricle demonstrates global hypokinesis. Definity  contrast agent was given IV to delineate the left ventricular endocardial borders. The left ventricular internal cavity size was normal in size. There is mild asymmetric left ventricular hypertrophy of the inferior segment. Left ventricular diastolic parameters are consistent with Grade II diastolic dysfunction (pseudonormalization). Right Ventricle: The right ventricular size is mildly enlarged. No increase in right ventricular wall thickness. Right ventricular systolic function is mildly reduced. There is normal pulmonary artery systolic pressure. The tricuspid regurgitant velocity  is 2.60 m/s, and with an assumed right atrial pressure of 8 mmHg, the estimated right ventricular systolic pressure is 35.0 mmHg. Left Atrium: Left atrial size was mildly dilated. Right Atrium: Right atrial size was mildly dilated. Pericardium: There is no evidence of pericardial effusion. Mitral Valve: Loose anterior leaflet chordae. The mitral valve is degenerative in appearance. There is moderate calcification of the posterior mitral valve leaflet(s).  Moderately decreased mobility of the mitral valve leaflets. Moderate to severe mitral annular calcification. Severe mitral valve regurgitation. Mild mitral valve stenosis. MV peak gradient, 5.7 mmHg. The mean mitral valve gradient is 2.0 mmHg. Tricuspid Valve: The tricuspid valve is normal in structure. Tricuspid valve regurgitation is mild. Aortic Valve: Can not exclude AV vegetation. The aortic valve is tricuspid. There is mild calcification of the aortic valve. There is mild thickening of the aortic valve. There is moderate aortic valve annular calcification. Aortic valve regurgitation is  not visualized. Aortic valve sclerosis/calcification is present, without any evidence of aortic stenosis. Aortic valve mean gradient measures 4.0 mmHg. Aortic valve peak gradient measures 6.7 mmHg. Aortic valve area, by VTI measures 2.30 cm. Pulmonic Valve: The pulmonic valve was normal in structure. Pulmonic valve regurgitation is not visualized. Aorta: The aortic root is normal in size and structure. There is mild (Grade II) atheroma plaque involving the aortic root and ascending aorta. Venous: The inferior vena cava is normal in size with less than 50% respiratory variability, suggesting right atrial pressure of 8 mmHg. IAS/Shunts: The atrial septum is grossly normal.  LEFT VENTRICLE PLAX 2D LVIDd:         4.20 cm     Diastology LVIDs:         3.70 cm     LV e' medial:    3.92 cm/s LV PW:         1.60 cm     LV E/e' medial:  23.9 LV IVS:        1.10 cm     LV e' lateral:   4.35 cm/s LVOT diam:     2.10 cm     LV E/e' lateral: 21.5 LV SV:         48 LV SV Index:   25 LVOT Area:     3.46 cm  LV Volumes (MOD) LV vol d, MOD A2C: 88.9 ml LV vol d,  MOD A4C: 81.6 ml LV vol s, MOD A2C: 75.4 ml LV vol s, MOD A4C: 44.2 ml LV SV MOD A2C:     13.5 ml LV SV MOD A4C:     81.6 ml LV SV MOD BP:      26.6 ml RIGHT VENTRICLE            IVC RV S prime:     7.94 cm/s  IVC diam: 2.00 cm TAPSE (M-mode): 1.0 cm LEFT ATRIUM           Index         RIGHT ATRIUM           Index LA diam:      2.80 cm 1.43 cm/m   RA Area:     10.40 cm LA Vol (A2C): 37.6 ml 19.11 ml/m  RA Volume:   23.00 ml  11.71 ml/m LA Vol (A4C): 33.2 ml 16.93 ml/m  AORTIC VALVE AV Area (Vmax):    2.20 cm AV Area (Vmean):   2.30 cm AV Area (VTI):     2.30 cm AV Vmax:           129.00 cm/s AV Vmean:          86.200 cm/s AV VTI:            0.211 m AV Peak Grad:      6.7 mmHg AV Mean Grad:      4.0 mmHg LVOT Vmax:         81.90 cm/s LVOT Vmean:        57.200 cm/s LVOT VTI:          0.140 m LVOT/AV VTI ratio: 0.66  AORTA Ao Root diam: 3.00 cm Ao Asc diam:  3.30 cm MITRAL VALVE                  TRICUSPID VALVE MV Area (PHT): 3.78 cm       TR Peak grad:   27.0 mmHg MV Area VTI:   1.61 cm       TR Vmax:        260.00 cm/s MV Peak grad:  5.7 mmHg MV Mean grad:  2.0 mmHg       SHUNTS MV Vmax:       1.19 m/s       Systemic VTI:  0.14 m MV Vmean:      74.6 cm/s      Systemic Diam: 2.10 cm MV Decel Time: 201 msec MR Peak grad:    76.0 mmHg MR Mean grad:    45.0 mmHg MR Vmax:         436.00 cm/s MR Vmean:        310.0 cm/s MR PISA:         1.01 cm MR PISA Eff ROA: 9 mm MR PISA Radius:  0.40 cm MV E velocity: 93.63 cm/s MV A velocity: 71.33 cm/s MV E/A ratio:  1.31 Salena Negri MD Electronically signed by Salena Negri MD Signature Date/Time: 03/18/2024/8:44:39 PM    Final    Medications:    Current Medications:  sodium chloride    Intravenous Once   Chlorhexidine  Gluconate Cloth  6 each Topical Daily   ferrous sulfate   325 mg Oral BID WC   insulin  aspart  0-15 Units Subcutaneous Q4H   levothyroxine   50 mcg Oral Daily   [START ON 2024/04/11] pantoprazole   40 mg Oral Daily   sodium chloride  flush  10-40 mL Intracatheter Q12H   Infusions:  amiodarone  30 mg/hr (03/19/24 1412)   linezolid  (ZYVOX ) IV 600 mg (03/19/24 1332)   norepinephrine  (LEVOPHED ) Adult infusion Stopped (03/19/24 1134)   piperacillin -tazobactam (ZOSYN )  IV 3.375 g (03/19/24 1453)   Patient Profile   Andrea Hoffman  is a 78 y.o. female with history of artial fibrillation (on warfarin), HFrEF (although no document EF <55%), HTN. T2DM, colon cancer s/p surgery and remote chemo, endometrial cancer and rheumatic fever.   Assessment/Plan   Mixed Shock: Septic/Cardiogenic - presented with LA 2.3, up to 4.6. Now 1.5 - WBC has continued to climb 27 today with procal 7.74 - infectious work up negative thus far - abx per CCM - newly reduced EF 30-35 with severe MR (loose anterior leaflet chordae) - Coox 64. CVP 10. CO/CI (fick) 5.5/2.6. SVR 756. - Off neo - will take for RHC with leave in [place] to guide HF optimization and hemodynamics, may need inotropes Informed Consent   Shared Decision Making/Informed Consent The risks [stroke (1 in 1000), death (1 in 1000), kidney failure [usually temporary] (1 in 500), bleeding (1 in 200), allergic reaction [possibly serious] (1 in 200)], benefits (diagnostic support and management of coronary artery disease) and alternatives of a cardiac catheterization and leave in [place] catherter were discussed in detail with Ms. Greenfield and she is willing to proceed.    2. Acute on Chronic HFrEF - noted in history, although no prior documented EF <55%. Previously followed with [facility] until 2018. - EF yesterday 30% - Cath lab for swan placement as above - may need inotrope support in the setting of renal failure and cardiogenic shock - currently oliguric, will need aggressive diuretic regimen vs CVVHD if not able to keep up with fluid; consider nephrology consult  3. Atrial Fibrillation - rates up to 170s on arrival - thyroid studies ok - rate controlled in 90s on amio 30/hr - continue amio 30//hr - supratherapeutic INR, likely in the setting of shock +/- hepatic congestion. Hold Coumadin - start heparin  once INR <2  4. AKI on CKD 3 - baseline Cr 1.3-1.4 - Cr up to 2.4 today with oliguria - pending RHC, may need volume removal with severe MR - may need CVVHD if unable  to manage volume with aggressive diuretics  5. Severe MR - noted moderate in 2022 - severe yesterday with degeneration and loose leaflet chordea - will need to diurese and optimize from a HF standpoint  6. Anemia - tsats 3, ferritin 171 - discuss IV Fe dosing with PharmD.  Discussed code status at the bedside with patient and husband who conform patient is DNR/DNI. Daughter updated by phone of plan for RHC.   Length of Stay: 2  CRITICAL CARE Performed by: Jordan Lee  Total critical care time: 13 minutes  Critical care time was exclusive of separately billable procedures and treating other patients.  Critical care was necessary to treat or prevent imminent or life-threatening deterioration.  Critical care was time spent personally by me on the following activities: development of treatment plan with patient and/or surrogate as well as nursing, discussions with consultants, evaluation of patient's response to treatment, examination of patient, obtaining history from patient or surrogate, ordering and performing treatments and interventions, ordering and review of laboratory studies, ordering and review of radiographic studies, pulse oximetry and re-evaluation of patient's condition.  Jordan Lee, NP  03/19/2024, 2:57 PM    Advanced Heart Failure Team Pager [phone] (M-F; 7a - 5p)  Please contact [facility] for night-coverage after hours (4p -  7a ) and weekends on [email or web address]  Agree with above   33 y/o woman with obesity, DM2, chronic AF, colon cancer s/p surgery and remote chemo, endometrial cancer s/p radiation and rheumatic fever (age 4).   Says she was told in the past that she had a weak heart but couldn't remember the details. Per the chart, she previously followed with [facility]. However last documented appointment was in 2018. Last echo in 2022 showed EF 55-60%, mod MR and mild stenosis.   According to her husband has been doing poorly over the past year with very  limited functional capacity. Admitted with prfound failure to thrive and AKI.   Echo EF 25-30% with severe MR.   ECG AF 135 RBBB/LAFB Personally reviewed  Denies CP. Extremely weak.   General:  Weak and ill-appearing. On 10L O2 HEENT: normal Neck: supple. JVP up .  Cor irregular rate & rhythm. 2/6 MR Lungs: + crackled Abdomen: soft, nontender, nondistended. No hepatosplenomegaly. No bruits or masses. Good bowel sounds. Extremities: no cyanosis, clubbing, rash, tr edema  cool  Neuro: alert & orientedx3, cranial nerves grossly intact. moves all 4 extremities w/o difficulty. Affect flat  She is likely in cardiogenic shock. 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 Discussed options with her and her husband. Agree with [facility] to help decision-making. Would not want heroic measures -> DNR/DNI. WIll proceed with [facility] this afternoon.  CRITICAL CARE Performed by: Cherrie Sieving  Total critical care time: 50 minutes  Critical care time was exclusive of separately billable procedures and treating other patients.  Critical care was necessary to treat or prevent imminent or life-threatening deterioration.  Critical care was time spent personally by me (independent of midlevel providers or residents) on the following activities: development of treatment plan with patient and/or surrogate as well as nursing, discussions with consultants, evaluation of patient's response to treatment, examination of patient, obtaining history from patient or surrogate, ordering and performing treatments and interventions, ordering and review of laboratory studies, ordering and review of radiographic studies, pulse oximetry and re-evaluation of patient's condition.  Sieving Cherrie, MD  3:46 PM

## 2024-03-20 DIAGNOSIS — R627 Adult failure to thrive: Secondary | ICD-10-CM | POA: Diagnosis not present

## 2024-03-20 DIAGNOSIS — R6521 Severe sepsis with septic shock: Secondary | ICD-10-CM | POA: Diagnosis not present

## 2024-03-20 DIAGNOSIS — A419 Sepsis, unspecified organism: Secondary | ICD-10-CM | POA: Diagnosis not present

## 2024-03-20 LAB — COOXEMETRY PANEL
Carboxyhemoglobin: 1.7 % — ABNORMAL HIGH (ref 0.5–1.5)
Methemoglobin: <0.7 % (ref 0.0–1.5)
O2 Saturation: 84.4 %
Total hemoglobin: 10.6 g/dL — ABNORMAL LOW (ref 12.0–16.0)

## 2024-03-20 LAB — HEPATIC FUNCTION PANEL
ALT: 621 U/L — ABNORMAL HIGH (ref 0–44)
AST: 390 U/L — ABNORMAL HIGH (ref 15–41)
Albumin: 2.1 g/dL — ABNORMAL LOW (ref 3.5–5.0)
Alkaline Phosphatase: 108 U/L (ref 38–126)
Bilirubin, Direct: 0.2 mg/dL (ref 0.0–0.2)
Indirect Bilirubin: 0.5 mg/dL (ref 0.3–0.9)
Total Bilirubin: 0.7 mg/dL (ref 0.0–1.2)
Total Protein: 5.6 g/dL — ABNORMAL LOW (ref 6.5–8.1)

## 2024-03-20 LAB — GASTROINTESTINAL PANEL BY PCR, STOOL (REPLACES STOOL CULTURE)

## 2024-03-20 LAB — BASIC METABOLIC PANEL WITH GFR
Anion gap: 15 (ref 5–15)
BUN: 58 mg/dL — ABNORMAL HIGH (ref 8–23)
CO2: 21 mmol/L — ABNORMAL LOW (ref 22–32)
Calcium: 8.1 mg/dL — ABNORMAL LOW (ref 8.9–10.3)
Chloride: 85 mmol/L — ABNORMAL LOW (ref 98–111)
Creatinine, Ser: 2.72 mg/dL — ABNORMAL HIGH (ref 0.44–1.00)
GFR, Estimated: 17 mL/min — ABNORMAL LOW (ref >60)
Glucose, Bld: 246 mg/dL — ABNORMAL HIGH (ref 70–99)
Potassium: 4 mmol/L (ref 3.5–5.1)
Sodium: 121 mmol/L — ABNORMAL LOW (ref 135–145)

## 2024-03-20 LAB — CBC
HCT: 33.4 % — ABNORMAL LOW (ref 36.0–46.0)
Hemoglobin: 10.1 g/dL — ABNORMAL LOW (ref 12.0–15.0)
MCH: 22 pg — ABNORMAL LOW (ref 26.0–34.0)
MCHC: 30.2 g/dL (ref 30.0–36.0)
MCV: 72.8 fL — ABNORMAL LOW (ref 80.0–100.0)
Platelets: 434 10*3/uL — ABNORMAL HIGH (ref 150–400)
RBC: 4.59 MIL/uL (ref 3.87–5.11)
RDW: 18.2 % — ABNORMAL HIGH (ref 11.5–15.5)
WBC: 25.3 10*3/uL — ABNORMAL HIGH (ref 4.0–10.5)
nRBC: 0.5 % — ABNORMAL HIGH (ref 0.0–0.2)

## 2024-03-20 LAB — MAGNESIUM: Magnesium: 2.9 mg/dL — ABNORMAL HIGH (ref 1.7–2.4)

## 2024-03-20 LAB — PROTIME-INR
INR: 3.8 — ABNORMAL HIGH (ref 0.8–1.2)
Prothrombin Time: 37.5 s — ABNORMAL HIGH (ref 11.4–15.2)

## 2024-03-20 LAB — GLUCOSE, CAPILLARY
Glucose-Capillary: 271 mg/dL — ABNORMAL HIGH (ref 70–99)
Glucose-Capillary: 269 mg/dL — ABNORMAL HIGH (ref 70–99)

## 2024-03-20 MED ORDER — DIPHENHYDRAMINE HCL 50 MG/ML IJ SOLN: 25.0000 mg | INTRAMUSCULAR | Status: DC | PRN

## 2024-03-20 MED ORDER — POLYVINYL ALCOHOL 1.4 % OP SOLN: 1.0000 [drp] | Freq: Four times a day (QID) | OPHTHALMIC | Status: DC | PRN

## 2024-03-20 MED ORDER — HYDROMORPHONE HCL-NACL 50-0.9 MG/50ML-% IV SOLN
0.0000 mg/h | INTRAVENOUS | Status: DC
  Administered 2024-03-20: 1 mg/h via INTRAVENOUS
  Filled 2024-03-20: qty 50

## 2024-03-20 MED ORDER — ACETAMINOPHEN 325 MG PO TABS: 650.0000 mg | ORAL_TABLET | Freq: Four times a day (QID) | ORAL | Status: DC | PRN

## 2024-03-20 MED ORDER — MIDAZOLAM BOLUS VIA INFUSION (WITHDRAWAL LIFE SUSTAINING TX): 2.0000 mg | INTRAVENOUS | Status: DC | PRN

## 2024-03-20 MED ORDER — GLYCOPYRROLATE 0.2 MG/ML IJ SOLN
0.2000 mg | INTRAMUSCULAR | Status: DC | PRN
  Administered 2024-03-20: 0.2 mg via INTRAVENOUS
  Filled 2024-03-20: qty 1

## 2024-03-20 MED ORDER — HYDROMORPHONE BOLUS VIA INFUSION
1.0000 mg | INTRAVENOUS | Status: DC | PRN
  Administered 2024-03-20: 1 mg via INTRAVENOUS

## 2024-03-20 MED ORDER — SODIUM CHLORIDE 0.9% FLUSH: 3.0000 mL | INTRAVENOUS | Status: DC | PRN

## 2024-03-20 MED ORDER — SODIUM CHLORIDE 0.9% FLUSH: 3.0000 mL | Freq: Two times a day (BID) | INTRAVENOUS | Status: DC

## 2024-03-20 MED ORDER — MIDAZOLAM-SODIUM CHLORIDE 100-0.9 MG/100ML-% IV SOLN
0.0000 mg/h | INTRAVENOUS | Status: DC
  Administered 2024-03-20: 1 mg/h via INTRAVENOUS
  Filled 2024-03-20: qty 100

## 2024-03-20 MED ORDER — MIDAZOLAM HCL 2 MG/2ML IJ SOLN: 1.0000 mg | INTRAMUSCULAR | Status: DC | PRN

## 2024-03-20 MED ORDER — MIDAZOLAM HCL 2 MG/2ML IJ SOLN: 5.0000 mg | Freq: Once | INTRAMUSCULAR | Status: DC

## 2024-03-20 MED ORDER — GLYCOPYRROLATE 1 MG PO TABS: 1.0000 mg | ORAL_TABLET | ORAL | Status: DC | PRN

## 2024-03-20 MED ORDER — ACETAMINOPHEN 650 MG RE SUPP
650.0000 mg | Freq: Four times a day (QID) | RECTAL | Status: DC | PRN
Start: 2024-03-20 — End: 2024-03-21

## 2024-03-20 MED ORDER — NOREPINEPHRINE 16 MG/250ML-% IV SOLN
0.0000 ug/min | INTRAVENOUS | Status: DC
  Administered 2024-03-20: 2 ug/min via INTRAVENOUS
  Filled 2024-03-20: qty 250

## 2024-03-20 MED ORDER — GLYCOPYRROLATE 0.2 MG/ML IJ SOLN: 0.2000 mg | INTRAMUSCULAR | Status: DC | PRN

## 2024-03-21 ENCOUNTER — Encounter (HOSPITAL_COMMUNITY): Payer: Self-pay | Admitting: Internal Medicine

## 2024-03-22 LAB — CULTURE, BLOOD (ROUTINE X 2)
Culture: NO GROWTH
Special Requests: ADEQUATE

## 2024-03-23 LAB — CULTURE, BLOOD (ROUTINE X 2): Culture: NO GROWTH

## 2024-04-01 NOTE — Plan of Care (Signed)
  Problem: Education: Goal: Ability to describe self-care measures that may prevent or decrease complications (Diabetes Survival Skills Education) will improve Outcome: Progressing Goal: Individualized Educational Video(s) Outcome: Progressing   Problem: Coping: Goal: Ability to adjust to condition or change in health will improve Outcome: Progressing   Problem: Fluid Volume: Goal: Ability to maintain a balanced intake and output will improve Outcome: Not Progressing   Problem: Health Behavior/Discharge Planning: Goal: Ability to identify and utilize available resources and services will improve Outcome: Progressing Goal: Ability to manage health-related needs will improve Outcome: Progressing

## 2024-04-01 NOTE — Progress Notes (Signed)
  Patient now on comfort care/drips.  Multiple family members in room.   Having some grunting and air hunger.   I increased versed  gtt to 5 and dilaudid  to 4 with improvement.   RN notified.   Jules Oar, MD  3:02 PM

## 2024-04-01 NOTE — Progress Notes (Signed)
 Asystole noted on bedside EKG.  No heart sounds auscultated x 1 minute.  Pronounced by this RN and [place] Raichura RN.  Family at bedside.  Dr Julane Ny and Dr Felipe Horton aware.

## 2024-04-01 NOTE — Progress Notes (Signed)
 PHARMACY - ANTICOAGULATION CONSULT NOTE  Pharmacy Consult for Heparin  when INR is <2 (warfarin on hold) Indication: atrial fibrillation  Allergies  Allergen Reactions   Hydrocodone  Other (See Comments)    Hallucinations    Patient Measurements: Height: 5\' 6"  (167.6 cm) Weight: 94.4 kg (208 lb 1.8 oz) IBW/kg (Calculated) : 59.3  Vital Signs: Temp: 96.3 F (35.7 C) Apr 09, 2024 0745) Temp Source: Core Apr 09, 2024 0000) BP: 90/58 Apr 09, 2024 0730) Pulse Rate: 92 04-09-2024 0745)  Labs: Recent Labs    03/18/24 0126 03/18/24 0718 03/18/24 1005 03/18/24 1308 03/19/24 0438 03/19/24 0825 03/19/24 1335 03/19/24 1526 03/19/24 2305 03/19/24 2316 2024-04-09 0603  HGB 8.3* 9.1*  --    < >  --  10.4*   < > 11.2*  11.2* 10.5*  --  10.1*  HCT 27.4* 29.4*  --    < >  --  33.2*   < > 33.0*  33.0* 31.0*  --  33.4*  PLT 313  --   --   --   --  396  --   --   --   --  434*  LABPROT  --  27.9*  --   --  27.8*  --   --   --   --   --  37.5*  INR  --  2.6*  --   --  2.6*  --   --   --   --   --  3.8*  CREATININE 1.93*  --   --   --   --  2.34*  --   --   --  2.66* 2.72*  TROPONINIHS 23*  --  29*  --   --   --   --   --   --   --   --    < > = values in this interval not displayed.    Estimated Creatinine Clearance: 20 mL/min (A) (by C-G formula based on SCr of 2.72 mg/dL (H)).   Medical History: Past Medical History:  Diagnosis Date   Anemia    Broken arm    Right shoulder   Bunion    Colon cancer (HCC)    surgery followed by chemo for "6 months"   Cyanocobalamin deficiency    Depression    Diabetes mellitus without complication (HCC)    Diabetic neuropathy (HCC)    Dyspnea    with exertion   Endometrial cancer (HCC)    Hammer toe    High cholesterol    History of pneumonia    Hypertension    Migraines    history of   Pulmonary nodules    Numerous small   Rheumatic fever    age 45    Assessment: 78 y/o F on warfarin PTA for afib, here with septic shock, holding warfarin and using  heparin  during critical illness, INR is currently 2, will start heparin  when INR is <2  Pt s/p RHC, INR remains elevated (3.8) so continue holding heparin .  Goal of Therapy:  Heparin  level 0.3-0.7 units/ml Monitor platelets by anticoagulation protocol: Yes   Plan:  Daily PT/INR Start heparin  when INR is <2  Cecillia Cogan, PharmD Clinical Pharmacist 04-09-2024  8:59 AM

## 2024-04-01 NOTE — Progress Notes (Signed)
 Advanced Heart Failure Rounding Note   Subjective:    Swan placed yesterday with low output and high filling pressures. Started on milrinone . Became hypotensive and NE added/  Anuric overnight. Periods of obtundation overnight requiring sternal rub.   Feels miserable. Says she wants us  to let her go.     Objective:   Weight Range:  Vital Signs:   Temp:  [95 F (35 C)-97.9 F (36.6 C)] 96.1 F (35.6 C) 04/08/2024 0900) Pulse Rate:  [0-121] 95 2024-04-08 0900) Resp:  [11-32] 17 04-08-24 0900) BP: (63-130)/(40-84) 98/63 04/08/2024 0900) SpO2:  [78 %-99 %] 92 % 2024-04-08 0900) Weight:  [93.5 kg-94.4 kg] 94.4 kg 08-Apr-2024 0500) Last BM Date : 03/19/24  Weight change: Filed Weights   03/19/24 0500 03/19/24 1700 08-Apr-2024 0500  Weight: 93.3 kg 93.5 kg 94.4 kg    Intake/Output:   Intake/Output Summary (Last 24 hours) at 04/08/24 1105 Last data filed at 04/08/2024 0900 Gross per 24 hour  Intake 2236.34 ml  Output --  Net 2236.34 ml     Physical Exam: General:  terminally ill  appearing. SOB/weak HEENT: normal  Neck: supple. RIJ swan  Cor: irreg coarse Lungs: coarse Abdomen: soft, nontender, nondistended.  Extremities: no cyanosis, clubbing, rash, 2= edema Neuro: uncomfortable HOH. Moves all 4  Telemetry: AF 90-100 Personally reviewed  Labs: Basic Metabolic Panel: Recent Labs  Lab 03/17/24 2045 03/18/24 0126 03/18/24 1308 03/19/24 0825 03/19/24 1335 03/19/24 1526 03/19/24 2305 03/19/24 2316 Apr 08, 2024 0603  NA 125* 124*   < > 125* 124* 124*  125* 126* 124* 121*  K 4.2 4.0   < > 3.6 3.4* 3.6  3.5 2.9* 3.2* 4.0  CL 90* 90*  --  89*  --   --   --  87* 85*  CO2 18* 16*  --  20*  --   --   --  22 21*  GLUCOSE 189* 188*  --  135*  --   --   --  239* 246*  BUN 50* 50*  --  53*  --   --   --  58* 58*  CREATININE 2.01* 1.93*  --  2.34*  --   --   --  2.66* 2.72*  CALCIUM  9.2 8.6*  --  8.2*  --   --   --  7.8* 8.1*  MG 1.6* 1.5*  --  3.2*  --   --   --  3.0* 2.9*  PHOS  --   4.3  --   --   --   --   --   --   --    < > = values in this interval not displayed.    Liver Function Tests: Recent Labs  Lab 03/17/24 2045 03/19/24 0825 04/08/2024 0603  AST 57* 1,133* 390*  ALT 39 889* 621*  ALKPHOS 100 120 108  BILITOT 0.8 0.4 0.7  PROT 7.4 6.0* 5.6*  ALBUMIN 3.0* 2.4* 2.1*   Recent Labs  Lab 03/17/24 2045  LIPASE 17   No results for input(s): "AMMONIA" in the last 168 hours.  CBC: Recent Labs  Lab 03/17/24 2045 03/18/24 0126 03/18/24 0718 03/19/24 0825 03/19/24 1335 03/19/24 1526 03/19/24 2305 Apr 08, 2024 0603  WBC 25.2* 24.6*  --  27.1*  --   --   --  25.3*  NEUTROABS 22.7*  --   --   --   --   --   --   --   HGB 8.7* 8.3*   < >  10.4* 9.9* 11.2*  11.2* 10.5* 10.1*  HCT 29.8* 27.4*   < > 33.2* 29.0* 33.0*  33.0* 31.0* 33.4*  MCV 74.1* 72.9*  --  72.0*  --   --   --  72.8*  PLT 311 313  --  396  --   --   --  434*   < > = values in this interval not displayed.    Cardiac Enzymes: No results for input(s): "CKTOTAL", "CKMB", "CKMBINDEX", "TROPONINI" in the last 168 hours.  BNP: BNP (last 3 results) Recent Labs    03/17/24 2045  BNP 655.9*    ProBNP (last 3 results) No results for input(s): "PROBNP" in the last 8760 hours.    Other results:  Imaging: Port CXR Result Date: 03/19/2024 CLINICAL DATA:  Line placement EXAM: PORTABLE CHEST 1 VIEW COMPARISON:  X-ray earlier 03/19/2024 and older FINDINGS: Stable right-sided PICC. New right IJ Swan-Ganz catheter with tip overlying the right pulmonary artery at the right lung hilum. No pneumothorax. Enlarged cardiopericardial silhouette. Prominence of central vasculature. Small pleural effusion. No edema. Calcifications along the mitral valve annulus. Overlapping cardiac leads. Degenerative changes along the spine. IMPRESSION: Interval placement of a right IJ Swan-Ganz catheter tip along the prior pulmonary artery at the right lung hilum. No pneumothorax. Small right effusion Electronically Signed    By: Adrianna Horde M.D.   On: 03/19/2024 19:44   US  Abdomen Limited RUQ (LIVER/GB) Result Date: 03/19/2024 CLINICAL DATA:  Transaminitis EXAM: ULTRASOUND ABDOMEN LIMITED RIGHT UPPER QUADRANT COMPARISON:  CT noncontrast 03/18/2024. FINDINGS: Gallbladder: Gallbladder mildly distended. There are stones towards the neck. Gallbladder wall thickening identified measuring up to 11 mm. There is gallbladder wall edema as well. No reported sonographic Murphy's sign. Common bile duct: Diameter: 4 mm Liver: No focal lesion identified. Within normal limits in parenchymal echogenicity. Portal vein is patent on color Doppler imaging with normal direction of blood flow towards the liver. Other: Ascites. IMPRESSION: Gallbladder mildly distended with stones towards the neck. There is also wall edema and thickening. This is nonspecific in the presence of ascites. No biliary ductal dilatation. Please correlate with clinical presentation of acute cholecystitis. If needed, HIDA scan may provide some additional evaluation. Electronically Signed   By: Adrianna Horde M.D.   On: 03/19/2024 19:42   CARDIAC CATHETERIZATION Result Date: 03/19/2024 Findings: RA = 22 RV = 45/24 PA = 45/32 (36) PCW = 24 (no significant v waves) Fick cardiac output/index = 3.9/1.9 TD CO/CI = 3.2/1.6 PVR = 3.1 (Fick) 3.8 (TD) Ao sat = 92% (on 10L) PA sat = 39%, 42% PAPi = 0.59 Assessment: 1. Severe biventricular HF with volume overload and low-output Plan/Discussion: Will start milrinone  and IV diuresis. Suspect Palliative Care is most realistic option here. Will d/w her and her family. Jules Oar, MD 3:50 PM  DG Chest Port 1 View Result Date: 03/19/2024 CLINICAL DATA:  [id number] S/P PICC central line placement [id number]. EXAM: PORTABLE CHEST 1 VIEW COMPARISON:  03/18/2024. FINDINGS: Low lung volume. There is increasing heterogeneous opacity overlying the right mid lower lung zones. Bilateral lung fields are otherwise clear. There is apparent blunting of  bilateral lateral costophrenic angles, which may be due to overlying soft tissue versus trace pleural effusions. Stable cardio-mediastinal silhouette. Note is made of dense mitral annulus calcification. No acute osseous abnormalities. The soft tissues are within normal limits. Right-sided PICC line seen with its tip overlying the lower portion of superior vena cava. IMPRESSION: 1. Interval placement of right-sided PICC line with its tip  overlying the lower portion of superior vena cava. No pneumothorax. 2. Increasing heterogeneous opacity overlying the right mid lower lung zones, which may represent atelectasis and/or pneumonia. 3. Blunting of bilateral lateral costophrenic angles, which may be due to overlying soft tissue versus trace pleural effusions. Electronically Signed   By: Beula Brunswick M.D.   On: 03/19/2024 13:00   US  EKG SITE RITE Result Date: 03/19/2024 If Site Rite image not attached, placement could not be confirmed due to current cardiac rhythm.  DG Chest 2 View Result Date: 03/18/2024 CLINICAL DATA:  Hypoxia EXAM: CHEST - 2 VIEW COMPARISON:  03/17/2024 FINDINGS: Focal opacity within the right mid lung zone is again noted in keeping with focal atelectasis or infiltrate. Small right pleural effusion noted. No pneumothorax. Cardiac size within normal limits. Pulmonary vascularity is normal. No acute bone abnormality. IMPRESSION: 1. Stable focal opacity within the right mid lung zone in keeping with focal atelectasis or infiltrate. 2. Small right pleural effusion. Electronically Signed   By: Worthy Heads M.D.   On: 03/18/2024 23:12   CT ABDOMEN PELVIS WO CONTRAST Result Date: 03/18/2024 CLINICAL DATA:  Septic shock and diarrhea. EXAM: CT ABDOMEN AND PELVIS WITHOUT CONTRAST TECHNIQUE: Multidetector CT imaging of the abdomen and pelvis was performed following the standard protocol without IV contrast. RADIATION DOSE REDUCTION: This exam was performed according to the departmental  dose-optimization program which includes automated exposure control, adjustment of the mA and/or kV according to patient size and/or use of iterative reconstruction technique. COMPARISON:  CT chest abdomen and pelvis 06/23/2021. FINDINGS: Lower chest: There is a stable 3 mm nodule in the left lower lobe. There is a small to moderate-sized right pleural effusion with atelectasis in the right lower lobe. Hepatobiliary: Gallstones are present. There is gallbladder wall edema. No blurring ductal dilatation identified. The liver is mildly enlarged. Pancreas: Unremarkable. No pancreatic ductal dilatation or surrounding inflammatory changes. Spleen: Normal in size without focal abnormality. Adrenals/Urinary Tract: The bladder is not well evaluated secondary to streak artifact in the pelvis. There is no hydronephrosis or renal calculus identified. The adrenal glands are within normal limits. Stomach/Bowel: There is been partial right colectomy. There is no bowel obstruction, wall thickening or inflammation. There is no pneumatosis or free air. The stomach is within normal limits. Vascular/Lymphatic: Aortic atherosclerosis. No enlarged abdominal or pelvic lymph nodes. Reproductive: Status post hysterectomy. No adnexal masses. Other: There is small volume ascites. There is mild body wall edema. Wide-mouth umbilical hernia is present containing nondilated bowel and mesenteric fat. Musculoskeletal: Right hip arthroplasty is present. IMPRESSION: 1. Cholelithiasis with gallbladder wall edema. Findings are nonspecific and may be related to volume overload. Correlate clinically for acute cholecystitis. 2. Small to moderate-sized right pleural effusion with atelectasis in the right lower lobe. 3. Small volume ascites. 4. Mild body wall edema. 5. Wide-mouth umbilical hernia containing nondilated bowel and mesenteric fat. Aortic Atherosclerosis (ICD10-I70.0). Electronically Signed   By: Tyron Gallon M.D.   On: 03/18/2024 23:04    ECHOCARDIOGRAM COMPLETE Result Date: 03/18/2024    ECHOCARDIOGRAM REPORT   Patient Name:   Andrea Hoffman Date of Exam: 03/18/2024 Medical Rec #:  [id number]       Height:       66.0 in Accession #:    [id number]      Weight:       191.8 lb Date of Birth:  July 14, 1946       BSA:          1.965 m Patient  Age:    77 years        BP:           89/56 mmHg Patient Gender: F               HR:           105 bpm. Exam Location:  Inpatient Procedure: 2D Echo, Cardiac Doppler, Color Doppler and Intracardiac            Opacification Agent (Both Spectral and Color Flow Doppler were            utilized during procedure). Indications:     Cardiac shock.  History:         Patient has no prior history of Echocardiogram examinations.                  Abnormal ECG, Arrythmias:Atrial Fibrillation; Risk                  Factors:Diabetes and Dyslipidemia. Cancer.  Sonographer:     Raynelle Callow RDCS Referring Phys:  [id number] [facility] Diagnosing Phys: Pasqual Bone MD  Sonographer Comments: Technically difficult study due to poor echo windows and patient is obese. Image acquisition challenging due to patient body habitus. IMPRESSIONS  1. Left ventricular ejection fraction, by estimation, is 30 to 35%. The left ventricle has moderately decreased function. The left ventricle demonstrates global hypokinesis. There is mild asymmetric left ventricular hypertrophy of the inferior segment. Left ventricular diastolic parameters are consistent with Grade II diastolic dysfunction (pseudonormalization).  2. Right ventricular systolic function is mildly reduced. The right ventricular size is mildly enlarged. There is normal pulmonary artery systolic pressure.  3. Left atrial size was mildly dilated.  4. Right atrial size was mildly dilated.  5. Loose anterior leaflet chordae.. The mitral valve is degenerative. Severe mitral valve regurgitation. Mild mitral stenosis. Moderate to severe mitral annular calcification.  6. The aortic valve is tricuspid.  There is mild calcification of the aortic valve. There is mild thickening of the aortic valve. Aortic valve regurgitation is not visualized. Aortic valve sclerosis/calcification is present, without any evidence of aortic stenosis.  7. There is mild (Grade II) atheroma plaque involving the aortic root and ascending aorta.  8. The inferior vena cava is normal in size with <50% respiratory variability, suggesting right atrial pressure of 8 mmHg. Conclusion(s)/Recommendation(s): Findings consistent with ischemic cardiomyopathy. FINDINGS  Left Ventricle: Left ventricular ejection fraction, by estimation, is 30 to 35%. The left ventricle has moderately decreased function. The left ventricle demonstrates global hypokinesis. Definity  contrast agent was given IV to delineate the left ventricular endocardial borders. The left ventricular internal cavity size was normal in size. There is mild asymmetric left ventricular hypertrophy of the inferior segment. Left ventricular diastolic parameters are consistent with Grade II diastolic dysfunction (pseudonormalization). Right Ventricle: The right ventricular size is mildly enlarged. No increase in right ventricular wall thickness. Right ventricular systolic function is mildly reduced. There is normal pulmonary artery systolic pressure. The tricuspid regurgitant velocity  is 2.60 m/s, and with an assumed right atrial pressure of 8 mmHg, the estimated right ventricular systolic pressure is 35.0 mmHg. Left Atrium: Left atrial size was mildly dilated. Right Atrium: Right atrial size was mildly dilated. Pericardium: There is no evidence of pericardial effusion. Mitral Valve: Loose anterior leaflet chordae. The mitral valve is degenerative in appearance. There is moderate calcification of the posterior mitral valve leaflet(s). Moderately decreased mobility of the mitral valve leaflets. Moderate to severe mitral  annular calcification. Severe mitral valve regurgitation. Mild mitral valve  stenosis. MV peak gradient, 5.7 mmHg. The mean mitral valve gradient is 2.0 mmHg. Tricuspid Valve: The tricuspid valve is normal in structure. Tricuspid valve regurgitation is mild. Aortic Valve: Can not exclude AV vegetation. The aortic valve is tricuspid. There is mild calcification of the aortic valve. There is mild thickening of the aortic valve. There is moderate aortic valve annular calcification. Aortic valve regurgitation is  not visualized. Aortic valve sclerosis/calcification is present, without any evidence of aortic stenosis. Aortic valve mean gradient measures 4.0 mmHg. Aortic valve peak gradient measures 6.7 mmHg. Aortic valve area, by VTI measures 2.30 cm. Pulmonic Valve: The pulmonic valve was normal in structure. Pulmonic valve regurgitation is not visualized. Aorta: The aortic root is normal in size and structure. There is mild (Grade II) atheroma plaque involving the aortic root and ascending aorta. Venous: The inferior vena cava is normal in size with less than 50% respiratory variability, suggesting right atrial pressure of 8 mmHg. IAS/Shunts: The atrial septum is grossly normal.  LEFT VENTRICLE PLAX 2D LVIDd:         4.20 cm     Diastology LVIDs:         3.70 cm     LV e' medial:    3.92 cm/s LV PW:         1.60 cm     LV E/e' medial:  23.9 LV IVS:        1.10 cm     LV e' lateral:   4.35 cm/s LVOT diam:     2.10 cm     LV E/e' lateral: 21.5 LV SV:         48 LV SV Index:   25 LVOT Area:     3.46 cm  LV Volumes (MOD) LV vol d, MOD A2C: 88.9 ml LV vol d, MOD A4C: 81.6 ml LV vol s, MOD A2C: 75.4 ml LV vol s, MOD A4C: 44.2 ml LV SV MOD A2C:     13.5 ml LV SV MOD A4C:     81.6 ml LV SV MOD BP:      26.6 ml RIGHT VENTRICLE            IVC RV S prime:     7.94 cm/s  IVC diam: 2.00 cm TAPSE (M-mode): 1.0 cm LEFT ATRIUM           Index        RIGHT ATRIUM           Index LA diam:      2.80 cm 1.43 cm/m   RA Area:     10.40 cm LA Vol (A2C): 37.6 ml 19.11 ml/m  RA Volume:   23.00 ml  11.71 ml/m LA  Vol (A4C): 33.2 ml 16.93 ml/m  AORTIC VALVE AV Area (Vmax):    2.20 cm AV Area (Vmean):   2.30 cm AV Area (VTI):     2.30 cm AV Vmax:           129.00 cm/s AV Vmean:          86.200 cm/s AV VTI:            0.211 m AV Peak Grad:      6.7 mmHg AV Mean Grad:      4.0 mmHg LVOT Vmax:         81.90 cm/s LVOT Vmean:        57.200 cm/s LVOT VTI:  0.140 m LVOT/AV VTI ratio: 0.66  AORTA Ao Root diam: 3.00 cm Ao Asc diam:  3.30 cm MITRAL VALVE                  TRICUSPID VALVE MV Area (PHT): 3.78 cm       TR Peak grad:   27.0 mmHg MV Area VTI:   1.61 cm       TR Vmax:        260.00 cm/s MV Peak grad:  5.7 mmHg MV Mean grad:  2.0 mmHg       SHUNTS MV Vmax:       1.19 m/s       Systemic VTI:  0.14 m MV Vmean:      74.6 cm/s      Systemic Diam: 2.10 cm MV Decel Time: 201 msec MR Peak grad:    76.0 mmHg MR Mean grad:    45.0 mmHg MR Vmax:         436.00 cm/s MR Vmean:        310.0 cm/s MR PISA:         1.01 cm MR PISA Eff ROA: 9 mm MR PISA Radius:  0.40 cm MV E velocity: 93.63 cm/s MV A velocity: 71.33 cm/s MV E/A ratio:  1.31 Pasqual Bone MD Electronically signed by Pasqual Bone MD Signature Date/Time: 03/18/2024/8:44:39 PM    Final      Medications:     Scheduled Medications:  sodium chloride    Intravenous Once   Chlorhexidine  Gluconate Cloth  6 each Topical Daily   ferrous sulfate   325 mg Oral BID WC    HYDROmorphone  (DILAUDID ) injection  0.5 mg Intravenous Once   insulin  aspart  0-15 Units Subcutaneous Q4H   levothyroxine   50 mcg Oral Daily   pantoprazole   40 mg Oral Daily   sodium chloride  flush  10-40 mL Intracatheter Q12H   sodium chloride  flush  3 mL Intravenous Q12H   sodium chloride  flush  3-10 mL Intravenous Q12H    Infusions:  sodium chloride      sodium chloride      sodium chloride      sodium chloride  10 mL/hr at 2024-04-02 0900   sodium chloride      amiodarone  30 mg/hr (2024/04/02 0900)   furosemide  Stopped (2024/04/02 1610)   HYDROmorphone      linezolid  (ZYVOX ) IV 600 mg  (04/02/2024 1008)   midazolam      milrinone  0.25 mcg/kg/min (2024-04-02 0900)   norepinephrine  (LEVOPHED ) Adult infusion 8 mcg/min (Apr 02, 2024 0900)   piperacillin -tazobactam (ZOSYN )  IV Stopped (Apr 02, 2024 0834)    PRN Medications: sodium chloride , sodium chloride , acetaminophen  **OR** acetaminophen , diphenhydrAMINE , docusate sodium , glycopyrrolate  **OR** glycopyrrolate  **OR** glycopyrrolate , HYDROmorphone , HYDROmorphone  (DILAUDID ) injection, midazolam , midazolam , ondansetron  (ZOFRAN ) IV, mouth rinse, polyethylene glycol, polyvinyl alcohol , sodium chloride  flush, sodium chloride  flush, sodium chloride  flush   Assessment/Plan:   1. Acute systolic HF -> Cardiogenic shock  - Echo EF 25-30% with severe MR - RHC with low output/shock - now on NE/milrinone . Output improved but has become anuric  2. Atrial Fibrillation, permanent - rate controlled  3. Acute hypoxic respiratory failure - increasing O2 needs in setting of HF and volume overload  4. AKI on CKD 3 - Cr now 2.8 - She is anuric   5. Severe MR   She has MSOF in setting of end-stage HF. No path to meaningful outcome. She is actively suffering and asking to be made comfort care. Family in agreement.   Will start comfort drips once family  arrives.   D/w CCM team who agrees.    CRITICAL CARE Performed by: Jules Oar  Total critical care time: 55 minutes  Critical care time was exclusive of separately billable procedures and treating other patients.  Critical care was necessary to treat or prevent imminent or life-threatening deterioration.  Critical care was time spent personally by me (independent of midlevel providers or residents) on the following activities: development of treatment plan with patient and/or surrogate as well as nursing, discussions with consultants, evaluation of patient's response to treatment, examination of patient, obtaining history from patient or surrogate, ordering and performing treatments and  interventions, ordering and review of laboratory studies, ordering and review of radiographic studies, pulse oximetry and re-evaluation of patient's condition.     Length of Stay: 3   Jules Oar MD 2024/03/26, 11:05 AM  Advanced Heart Failure Team Pager [phone] (M-F; 7a - 4p)  Please contact [facility] for night-coverage after hours (4p -7a ) and weekends on [email or web address]

## 2024-04-01 NOTE — Progress Notes (Signed)
   NAME:  Andrea Hoffman, MRN:  [id number], DOB:  1946/03/03, LOS: 3 ADMISSION DATE:  03/17/2024, CONSULTATION DATE:  03/18/2024 REFERRING MD: ED MD, CHIEF COMPLAINT:  Hypotension    History of Present Illness:  78 year old female with past medical history of below, which is significant for atrial fibrillation on warfarin, congestive heart failure, hypertension, diabetes, colon cancer status postsurgery and chemotherapy remote, endometrial cancer, and rheumatic fever.  She was in her usual state of health until 4/13 when she developed fevers, chills, malaise, vomiting, and diarrhea.  Diarrhea and emesis did not appear bloody.  She reports shortness of breath on good days, but has been worse since this illness started.  She has nonproductive cough as well.  Symptoms persisted until 4/16 when she suffered a syncopal episode and presented to [facility] emergency department.  Upon arrival to the ED she was noted to be tachycardic with rates up to the 170s in atrial fibrillation and was also hypotensive with systolic blood pressures in the 80s to low 100s.  She was given empiric antibiotics and 2 L of IV fluid without significant improvement of her blood pressure or heart rate.  PCCM is asked to admit to ICU.   Pertinent  Medical History  History of Anemia, Broken arm, Bunion, Colon cancer (HCC), Cyanocobalamin deficiency, Depression, Diabetes mellitus without complication (HCC), Diabetic neuropathy (HCC), Dyspnea, Endometrial cancer (HCC), Hammer toe, High cholesterol, History of pneumonia, Hypertension, Migraines, Pulmonary nodules, and Rheumatic fever  Significant Hospital Events: Including procedures, antibiotic start and stop dates in addition to other pertinent events   4/16: admitted to ICU 4/17: on Amiodarone  drip, Neo synephrine 55 mcg/min, LR 100 cc/hr. Poor UOP, positive fluid balance 2.2 L since admission. On 6 L [place]. Thirsty. In NSR 4/18  AHF consult, RHC and history c/w end stage HF  Interim  History / Subjective:  Feels miserable on pressors, inotropes; weak, nauseous, in pain all over  Objective   Blood pressure 98/63, pulse 95, temperature (!) 96.1 F (35.6 C), resp. rate 17, height 5\' 6"  (1.676 m), weight 94.4 kg, SpO2 92%. PAP: (18-58)/(3-26) 33/22 CVP:  [1 mmHg-28 mmHg] 10 mmHg CO:  [3.3 L/min-6.4 L/min] 5.6 L/min CI:  [1.6 L/min/m2-3.2 L/min/m2] 2.75 L/min/m2      Intake/Output Summary (Last 24 hours) at 2024-04-10 1025 Last data filed at 2024/04/10 0900 Gross per 24 hour  Intake 2804.06 ml  Output 200 ml  Net 2604.06 ml   Filed Weights   03/19/24 0500 03/19/24 1700 2024/04/10 0500  Weight: 93.3 kg 93.5 kg 94.4 kg    Examination: Chronically ill appearing Moves to command but weak Ext warm Heart sounds irregular, +murmur ++edema Aox3  [place] improved UOP not really budging  Resolved Hospital Problem list   Hypokalemia  Assessment & Plan:  Longstanding FTT, DOE, weakness, dizziness- totality of information now points toward slowly progressive heart failure now in low output state without treatment options.  Has been suffering at home and she/husband do not think she wants further aggressive management.  They would like to focus on quality of life in what time remains rather than quantity.  Remainder of family coming in to say goodbye.  - Dilaudid  and versed  gtt titrated to WOB and comfort - Once family here, turn off pressors and milrinone  - In hospital passing expected, condolences offered - Appreciate Dr. Andria Keeler help with this tough case  DNR  31 min cc time Ardelle Kos MD PCCM

## 2024-04-01 DEATH — deceased

## 2024-05-02 NOTE — Death Summary Note (Signed)
 DEATH SUMMARY   Patient Details  Name: Andrea Hoffman MRN: [id number] DOB: 03-10-1946  Admission/Discharge Information   Admit Date:  2024-03-26  Date of Death: Date of Death: Mar 29, 2024  Time of Death: Time of Death: 1635-04-22  Length of Stay: 3  Referring Physician: Lucius Sabins., MD     Diagnoses  Preliminary cause of death: RV failure x 5 years Cardiorenal syndrome Congestive hepatopathy FTT Afib/RVR AOCD DM2 HTN   Brief Hospital Course (including significant findings, care, treatment, and services provided and events leading to death)  78 year old female with past medical history of below, which is significant for atrial fibrillation on warfarin, congestive heart failure, hypertension, diabetes, colon cancer status postsurgery and chemotherapy remote, endometrial cancer, and rheumatic fever. She was in her usual state of health until 4/13 when she developed fevers, chills, malaise, vomiting, and diarrhea. Diarrhea and emesis did not appear bloody. She reports shortness of breath on good days, but has been worse since this illness started. She has nonproductive cough as well. Symptoms persisted until 03-26-24 when she suffered a syncopal episode and presented to [facility] emergency department. Upon arrival to the ED she was noted to be tachycardic with rates up to the 170s in atrial fibrillation and was also hypotensive with systolic blood pressures in the 80s to low 100s. She was given empiric antibiotics and 2 L of IV fluid without significant improvement of her blood pressure or heart rate. PCCM is asked to admit to ICU.   On further questioning, patient has been progressively getting weaker and having trouble with ADLs for past 5 years.  Subsequent workup revealed end stage heart failure.  Discussed limited options with patient and given baseline severely compromised QoL and pain, she wanted to pass peacefully.  Pertinent Labs and Studies  Significant Diagnostic Studies Port  CXR Result Date: 03/19/2024 CLINICAL DATA:  Line placement EXAM: PORTABLE CHEST 1 VIEW COMPARISON:  X-ray earlier 03/19/2024 and older FINDINGS: Stable right-sided PICC. New right IJ Swan-Ganz catheter with tip overlying the right pulmonary artery at the right lung hilum. No pneumothorax. Enlarged cardiopericardial silhouette. Prominence of central vasculature. Small pleural effusion. No edema. Calcifications along the mitral valve annulus. Overlapping cardiac leads. Degenerative changes along the spine. IMPRESSION: Interval placement of a right IJ Swan-Ganz catheter tip along the prior pulmonary artery at the right lung hilum. No pneumothorax. Small right effusion Electronically Signed   By: Adrianna Horde M.D.   On: 03/19/2024 19:44   US  Abdomen Limited RUQ (LIVER/GB) Result Date: 03/19/2024 CLINICAL DATA:  Transaminitis EXAM: ULTRASOUND ABDOMEN LIMITED RIGHT UPPER QUADRANT COMPARISON:  CT noncontrast 03/18/2024. FINDINGS: Gallbladder: Gallbladder mildly distended. There are stones towards the neck. Gallbladder wall thickening identified measuring up to 11 mm. There is gallbladder wall edema as well. No reported sonographic Murphy's sign. Common bile duct: Diameter: 4 mm Liver: No focal lesion identified. Within normal limits in parenchymal echogenicity. Portal vein is patent on color Doppler imaging with normal direction of blood flow towards the liver. Other: Ascites. IMPRESSION: Gallbladder mildly distended with stones towards the neck. There is also wall edema and thickening. This is nonspecific in the presence of ascites. No biliary ductal dilatation. Please correlate with clinical presentation of acute cholecystitis. If needed, HIDA scan may provide some additional evaluation. Electronically Signed   By: Adrianna Horde M.D.   On: 03/19/2024 19:42   CARDIAC CATHETERIZATION Result Date: 03/19/2024 Findings: RA = 22 RV = 45/24 PA = 45/32 (36) PCW = 24 (no significant v waves)  Fick cardiac output/index =  3.9/1.9 TD CO/CI = 3.2/1.6 PVR = 3.1 (Fick) 3.8 (TD) Ao sat = 92% (on 10L) PA sat = 39%, 42% PAPi = 0.59 Assessment: 1. Severe biventricular HF with volume overload and low-output Plan/Discussion: Will start milrinone  and IV diuresis. Suspect Palliative Care is most realistic option here. Will d/w her and her family. Jules Oar, MD 3:50 PM  DG Chest Port 1 View Result Date: 03/19/2024 CLINICAL DATA:  [id number] S/P PICC central line placement [id number]. EXAM: PORTABLE CHEST 1 VIEW COMPARISON:  03/18/2024. FINDINGS: Low lung volume. There is increasing heterogeneous opacity overlying the right mid lower lung zones. Bilateral lung fields are otherwise clear. There is apparent blunting of bilateral lateral costophrenic angles, which may be due to overlying soft tissue versus trace pleural effusions. Stable cardio-mediastinal silhouette. Note is made of dense mitral annulus calcification. No acute osseous abnormalities. The soft tissues are within normal limits. Right-sided PICC line seen with its tip overlying the lower portion of superior vena cava. IMPRESSION: 1. Interval placement of right-sided PICC line with its tip overlying the lower portion of superior vena cava. No pneumothorax. 2. Increasing heterogeneous opacity overlying the right mid lower lung zones, which may represent atelectasis and/or pneumonia. 3. Blunting of bilateral lateral costophrenic angles, which may be due to overlying soft tissue versus trace pleural effusions. Electronically Signed   By: Beula Brunswick M.D.   On: 03/19/2024 13:00   US  EKG SITE RITE Result Date: 03/19/2024 If Site Rite image not attached, placement could not be confirmed due to current cardiac rhythm.  DG Chest 2 View Result Date: 03/18/2024 CLINICAL DATA:  Hypoxia EXAM: CHEST - 2 VIEW COMPARISON:  03/17/2024 FINDINGS: Focal opacity within the right mid lung zone is again noted in keeping with focal atelectasis or infiltrate. Small right pleural effusion noted. No  pneumothorax. Cardiac size within normal limits. Pulmonary vascularity is normal. No acute bone abnormality. IMPRESSION: 1. Stable focal opacity within the right mid lung zone in keeping with focal atelectasis or infiltrate. 2. Small right pleural effusion. Electronically Signed   By: Worthy Heads M.D.   On: 03/18/2024 23:12   CT ABDOMEN PELVIS WO CONTRAST Result Date: 03/18/2024 CLINICAL DATA:  Septic shock and diarrhea. EXAM: CT ABDOMEN AND PELVIS WITHOUT CONTRAST TECHNIQUE: Multidetector CT imaging of the abdomen and pelvis was performed following the standard protocol without IV contrast. RADIATION DOSE REDUCTION: This exam was performed according to the departmental dose-optimization program which includes automated exposure control, adjustment of the mA and/or kV according to patient size and/or use of iterative reconstruction technique. COMPARISON:  CT chest abdomen and pelvis 06/23/2021. FINDINGS: Lower chest: There is a stable 3 mm nodule in the left lower lobe. There is a small to moderate-sized right pleural effusion with atelectasis in the right lower lobe. Hepatobiliary: Gallstones are present. There is gallbladder wall edema. No blurring ductal dilatation identified. The liver is mildly enlarged. Pancreas: Unremarkable. No pancreatic ductal dilatation or surrounding inflammatory changes. Spleen: Normal in size without focal abnormality. Adrenals/Urinary Tract: The bladder is not well evaluated secondary to streak artifact in the pelvis. There is no hydronephrosis or renal calculus identified. The adrenal glands are within normal limits. Stomach/Bowel: There is been partial right colectomy. There is no bowel obstruction, wall thickening or inflammation. There is no pneumatosis or free air. The stomach is within normal limits. Vascular/Lymphatic: Aortic atherosclerosis. No enlarged abdominal or pelvic lymph nodes. Reproductive: Status post hysterectomy. No adnexal masses. Other: There is small  volume ascites.  There is mild body wall edema. Wide-mouth umbilical hernia is present containing nondilated bowel and mesenteric fat. Musculoskeletal: Right hip arthroplasty is present. IMPRESSION: 1. Cholelithiasis with gallbladder wall edema. Findings are nonspecific and may be related to volume overload. Correlate clinically for acute cholecystitis. 2. Small to moderate-sized right pleural effusion with atelectasis in the right lower lobe. 3. Small volume ascites. 4. Mild body wall edema. 5. Wide-mouth umbilical hernia containing nondilated bowel and mesenteric fat. Aortic Atherosclerosis (ICD10-I70.0). Electronically Signed   By: Tyron Gallon M.D.   On: 03/18/2024 23:04   ECHOCARDIOGRAM COMPLETE Result Date: 03/18/2024    ECHOCARDIOGRAM REPORT   Patient Name:   VIVI FASSLER Date of Exam: 03/18/2024 Medical Rec #:  [id number]       Height:       66.0 in Accession #:    [id number]      Weight:       191.8 lb Date of Birth:  07/23/1946       BSA:          1.965 m Patient Age:    77 years        BP:           89/56 mmHg Patient Gender: F               HR:           105 bpm. Exam Location:  Inpatient Procedure: 2D Echo, Cardiac Doppler, Color Doppler and Intracardiac            Opacification Agent (Both Spectral and Color Flow Doppler were            utilized during procedure). Indications:     Cardiac shock.  History:         Patient has no prior history of Echocardiogram examinations.                  Abnormal ECG, Arrythmias:Atrial Fibrillation; Risk                  Factors:Diabetes and Dyslipidemia. Cancer.  Sonographer:     Raynelle Callow RDCS Referring Phys:  [id number] [facility] Diagnosing Phys: Pasqual Bone MD  Sonographer Comments: Technically difficult study due to poor echo windows and patient is obese. Image acquisition challenging due to patient body habitus. IMPRESSIONS  1. Left ventricular ejection fraction, by estimation, is 30 to 35%. The left ventricle has moderately decreased function. The left  ventricle demonstrates global hypokinesis. There is mild asymmetric left ventricular hypertrophy of the inferior segment. Left ventricular diastolic parameters are consistent with Grade II diastolic dysfunction (pseudonormalization).  2. Right ventricular systolic function is mildly reduced. The right ventricular size is mildly enlarged. There is normal pulmonary artery systolic pressure.  3. Left atrial size was mildly dilated.  4. Right atrial size was mildly dilated.  5. Loose anterior leaflet chordae.. The mitral valve is degenerative. Severe mitral valve regurgitation. Mild mitral stenosis. Moderate to severe mitral annular calcification.  6. The aortic valve is tricuspid. There is mild calcification of the aortic valve. There is mild thickening of the aortic valve. Aortic valve regurgitation is not visualized. Aortic valve sclerosis/calcification is present, without any evidence of aortic stenosis.  7. There is mild (Grade II) atheroma plaque involving the aortic root and ascending aorta.  8. The inferior vena cava is normal in size with <50% respiratory variability, suggesting right atrial pressure of 8 mmHg. Conclusion(s)/Recommendation(s): Findings consistent with ischemic cardiomyopathy. FINDINGS  Left Ventricle: Left  ventricular ejection fraction, by estimation, is 30 to 35%. The left ventricle has moderately decreased function. The left ventricle demonstrates global hypokinesis. Definity  contrast agent was given IV to delineate the left ventricular endocardial borders. The left ventricular internal cavity size was normal in size. There is mild asymmetric left ventricular hypertrophy of the inferior segment. Left ventricular diastolic parameters are consistent with Grade II diastolic dysfunction (pseudonormalization). Right Ventricle: The right ventricular size is mildly enlarged. No increase in right ventricular wall thickness. Right ventricular systolic function is mildly reduced. There is normal  pulmonary artery systolic pressure. The tricuspid regurgitant velocity  is 2.60 m/s, and with an assumed right atrial pressure of 8 mmHg, the estimated right ventricular systolic pressure is 35.0 mmHg. Left Atrium: Left atrial size was mildly dilated. Right Atrium: Right atrial size was mildly dilated. Pericardium: There is no evidence of pericardial effusion. Mitral Valve: Loose anterior leaflet chordae. The mitral valve is degenerative in appearance. There is moderate calcification of the posterior mitral valve leaflet(s). Moderately decreased mobility of the mitral valve leaflets. Moderate to severe mitral annular calcification. Severe mitral valve regurgitation. Mild mitral valve stenosis. MV peak gradient, 5.7 mmHg. The mean mitral valve gradient is 2.0 mmHg. Tricuspid Valve: The tricuspid valve is normal in structure. Tricuspid valve regurgitation is mild. Aortic Valve: Can not exclude AV vegetation. The aortic valve is tricuspid. There is mild calcification of the aortic valve. There is mild thickening of the aortic valve. There is moderate aortic valve annular calcification. Aortic valve regurgitation is  not visualized. Aortic valve sclerosis/calcification is present, without any evidence of aortic stenosis. Aortic valve mean gradient measures 4.0 mmHg. Aortic valve peak gradient measures 6.7 mmHg. Aortic valve area, by VTI measures 2.30 cm. Pulmonic Valve: The pulmonic valve was normal in structure. Pulmonic valve regurgitation is not visualized. Aorta: The aortic root is normal in size and structure. There is mild (Grade II) atheroma plaque involving the aortic root and ascending aorta. Venous: The inferior vena cava is normal in size with less than 50% respiratory variability, suggesting right atrial pressure of 8 mmHg. IAS/Shunts: The atrial septum is grossly normal.  LEFT VENTRICLE PLAX 2D LVIDd:         4.20 cm     Diastology LVIDs:         3.70 cm     LV e' medial:    3.92 cm/s LV PW:         1.60  cm     LV E/e' medial:  23.9 LV IVS:        1.10 cm     LV e' lateral:   4.35 cm/s LVOT diam:     2.10 cm     LV E/e' lateral: 21.5 LV SV:         48 LV SV Index:   25 LVOT Area:     3.46 cm  LV Volumes (MOD) LV vol d, MOD A2C: 88.9 ml LV vol d, MOD A4C: 81.6 ml LV vol s, MOD A2C: 75.4 ml LV vol s, MOD A4C: 44.2 ml LV SV MOD A2C:     13.5 ml LV SV MOD A4C:     81.6 ml LV SV MOD BP:      26.6 ml RIGHT VENTRICLE            IVC RV S prime:     7.94 cm/s  IVC diam: 2.00 cm TAPSE (M-mode): 1.0 cm LEFT ATRIUM  Index        RIGHT ATRIUM           Index LA diam:      2.80 cm 1.43 cm/m   RA Area:     10.40 cm LA Vol (A2C): 37.6 ml 19.11 ml/m  RA Volume:   23.00 ml  11.71 ml/m LA Vol (A4C): 33.2 ml 16.93 ml/m  AORTIC VALVE AV Area (Vmax):    2.20 cm AV Area (Vmean):   2.30 cm AV Area (VTI):     2.30 cm AV Vmax:           129.00 cm/s AV Vmean:          86.200 cm/s AV VTI:            0.211 m AV Peak Grad:      6.7 mmHg AV Mean Grad:      4.0 mmHg LVOT Vmax:         81.90 cm/s LVOT Vmean:        57.200 cm/s LVOT VTI:          0.140 m LVOT/AV VTI ratio: 0.66  AORTA Ao Root diam: 3.00 cm Ao Asc diam:  3.30 cm MITRAL VALVE                  TRICUSPID VALVE MV Area (PHT): 3.78 cm       TR Peak grad:   27.0 mmHg MV Area VTI:   1.61 cm       TR Vmax:        260.00 cm/s MV Peak grad:  5.7 mmHg MV Mean grad:  2.0 mmHg       SHUNTS MV Vmax:       1.19 m/s       Systemic VTI:  0.14 m MV Vmean:      74.6 cm/s      Systemic Diam: 2.10 cm MV Decel Time: 201 msec MR Peak grad:    76.0 mmHg MR Mean grad:    45.0 mmHg MR Vmax:         436.00 cm/s MR Vmean:        310.0 cm/s MR PISA:         1.01 cm MR PISA Eff ROA: 9 mm MR PISA Radius:  0.40 cm MV E velocity: 93.63 cm/s MV A velocity: 71.33 cm/s MV E/A ratio:  1.31 Pasqual Bone MD Electronically signed by Pasqual Bone MD Signature Date/Time: 03/18/2024/8:44:39 PM    Final    DG Abd Portable 1V Result Date: 03/18/2024 CLINICAL DATA:  Nausea and vomiting EXAM: PORTABLE  ABDOMEN - 1 VIEW COMPARISON:  None Available. FINDINGS: Relative paucity of bowel gas is noted. Postsurgical changes are noted in the right abdomen. No free air is seen. No acute bony abnormality is noted. IMPRESSION: Relative paucity of bowel gas.  No acute abnormality noted. Electronically Signed   By: Violeta Grey M.D.   On: 03/18/2024 02:00   DG Chest Port 1 View Result Date: 03/17/2024 CLINICAL DATA:  dyspnea rapid afib EXAM: PORTABLE CHEST 1 VIEW COMPARISON:  Chest x-ray 08/27/2021 FINDINGS: Patient is rotated. The heart and mediastinal contours are within normal limits. Question developing right mid lung zone airspace opacity. No pulmonary edema. No pleural effusion. No pneumothorax. No acute osseous abnormality. IMPRESSION: Question developing right mid lung zone airspace opacity. Limited evaluation due to patient rotation. Recommend repeat PA and lateral view of the chest with improved patient positioning. Electronically Signed   By:  Morgane  Naveau M.D.   On: 03/17/2024 22:17    Microbiology No results found for this or any previous visit (from the past 240 hours).  Lab Basic Metabolic Panel: No results for input(s): "NA", "K", "CL", "CO2", "GLUCOSE", "BUN", "CREATININE", "CALCIUM ", "MG", "PHOS" in the last 168 hours. Liver Function Tests: No results for input(s): "AST", "ALT", "ALKPHOS", "BILITOT", "PROT", "ALBUMIN" in the last 168 hours. No results for input(s): "LIPASE", "AMYLASE" in the last 168 hours. No results for input(s): "AMMONIA" in the last 168 hours. CBC: No results for input(s): "WBC", "NEUTROABS", "HGB", "HCT", "MCV", "PLT" in the last 168 hours. Cardiac Enzymes: No results for input(s): "CKTOTAL", "CKMB", "CKMBINDEX", "TROPONINI" in the last 168 hours. Sepsis Labs: No results for input(s): "PROCALCITON", "WBC", "LATICACIDVEN" in the last 168 hours.   Josiah Nigh 04/07/2024, 3:49 PM
